# Patient Record
Sex: Female | Born: 1935 | Race: White | Hispanic: No | Smoking: Never smoker
Health system: Southern US, Community
[De-identification: ages and names within clinical notes are randomized; demographics above are authoritative.]

## PROBLEM LIST (undated history)

## (undated) DIAGNOSIS — G459 Transient cerebral ischemic attack, unspecified: Secondary | ICD-10-CM

## (undated) DIAGNOSIS — C801 Malignant (primary) neoplasm, unspecified: Secondary | ICD-10-CM

## (undated) DIAGNOSIS — E785 Hyperlipidemia, unspecified: Secondary | ICD-10-CM

## (undated) DIAGNOSIS — M1990 Unspecified osteoarthritis, unspecified site: Secondary | ICD-10-CM

## (undated) DIAGNOSIS — H409 Unspecified glaucoma: Secondary | ICD-10-CM

## (undated) DIAGNOSIS — G510 Bell's palsy: Secondary | ICD-10-CM

## (undated) DIAGNOSIS — I4891 Unspecified atrial fibrillation: Secondary | ICD-10-CM

## (undated) DIAGNOSIS — I10 Essential (primary) hypertension: Secondary | ICD-10-CM

## (undated) DIAGNOSIS — I509 Heart failure, unspecified: Secondary | ICD-10-CM

## (undated) DIAGNOSIS — K219 Gastro-esophageal reflux disease without esophagitis: Secondary | ICD-10-CM

## (undated) DIAGNOSIS — M316 Other giant cell arteritis: Secondary | ICD-10-CM

## (undated) HISTORY — PX: EYE SURGERY: SHX253

## (undated) HISTORY — PX: ABDOMINAL HYSTERECTOMY: SHX81

## (undated) HISTORY — PX: TONSILLECTOMY: SUR1361

## (undated) HISTORY — PX: APPENDECTOMY: SHX54

## (undated) HISTORY — PX: CHOLECYSTECTOMY: SHX55

## (undated) HISTORY — PX: CAROTID ENDARTERECTOMY: SUR193

## (undated) HISTORY — PX: BREAST SURGERY: SHX581

## (undated) HISTORY — PX: JOINT REPLACEMENT: SHX530

## (undated) HISTORY — PX: CARDIAC CATHETERIZATION: SHX172

---

## 2004-01-16 ENCOUNTER — Other Ambulatory Visit: Payer: Self-pay

## 2004-01-23 ENCOUNTER — Inpatient Hospital Stay: Payer: Self-pay | Admitting: Unknown Physician Specialty

## 2004-08-14 ENCOUNTER — Ambulatory Visit: Payer: Self-pay | Admitting: General Surgery

## 2005-08-20 ENCOUNTER — Ambulatory Visit: Payer: Self-pay | Admitting: General Surgery

## 2006-01-03 ENCOUNTER — Other Ambulatory Visit: Payer: Self-pay

## 2006-01-04 ENCOUNTER — Inpatient Hospital Stay: Payer: Self-pay | Admitting: Internal Medicine

## 2006-08-26 ENCOUNTER — Ambulatory Visit: Payer: Self-pay | Admitting: General Surgery

## 2007-08-27 ENCOUNTER — Ambulatory Visit: Payer: Self-pay | Admitting: General Surgery

## 2008-06-13 ENCOUNTER — Inpatient Hospital Stay: Payer: Self-pay | Admitting: Internal Medicine

## 2008-09-06 ENCOUNTER — Ambulatory Visit: Payer: Self-pay | Admitting: General Surgery

## 2008-11-20 ENCOUNTER — Emergency Department: Payer: Self-pay | Admitting: Emergency Medicine

## 2009-08-03 ENCOUNTER — Ambulatory Visit: Payer: Self-pay | Admitting: Family Medicine

## 2009-11-03 ENCOUNTER — Ambulatory Visit: Payer: Self-pay | Admitting: General Surgery

## 2010-07-22 ENCOUNTER — Inpatient Hospital Stay: Payer: Self-pay | Admitting: Internal Medicine

## 2012-03-30 ENCOUNTER — Emergency Department: Payer: Self-pay | Admitting: Emergency Medicine

## 2012-03-30 LAB — COMPREHENSIVE METABOLIC PANEL
Albumin: 3.9 g/dL (ref 3.4–5.0)
Alkaline Phosphatase: 121 U/L (ref 50–136)
Anion Gap: 7 (ref 7–16)
BUN: 26 mg/dL — ABNORMAL HIGH (ref 7–18)
Co2: 26 mmol/L (ref 21–32)
Creatinine: 0.96 mg/dL (ref 0.60–1.30)
EGFR (African American): 59 — ABNORMAL LOW
EGFR (Non-African Amer.): 51 — ABNORMAL LOW
Glucose: 106 mg/dL — ABNORMAL HIGH (ref 65–99)
Osmolality: 277 (ref 275–301)
Potassium: 4.1 mmol/L (ref 3.5–5.1)
Sodium: 136 mmol/L (ref 136–145)

## 2012-03-30 LAB — CBC
HCT: 37.8 % (ref 35.0–47.0)
MCHC: 32.4 g/dL (ref 32.0–36.0)
Platelet: 190 10*3/uL (ref 150–440)
RBC: 3.97 10*6/uL (ref 3.80–5.20)
RDW: 13.7 % (ref 11.5–14.5)
WBC: 10.1 10*3/uL (ref 3.6–11.0)

## 2012-03-30 LAB — PROTIME-INR: INR: 2.5

## 2012-04-21 ENCOUNTER — Ambulatory Visit: Payer: Self-pay | Admitting: Otolaryngology

## 2014-05-08 ENCOUNTER — Emergency Department: Payer: Self-pay | Admitting: Emergency Medicine

## 2014-05-08 LAB — CBC
HCT: 38.5 % (ref 35.0–47.0)
HGB: 12.6 g/dL (ref 12.0–16.0)
MCH: 29.8 pg (ref 26.0–34.0)
MCHC: 32.6 g/dL (ref 32.0–36.0)
MCV: 91 fL (ref 80–100)
Platelet: 188 10*3/uL (ref 150–440)
RBC: 4.21 10*6/uL (ref 3.80–5.20)
RDW: 14.3 % (ref 11.5–14.5)
WBC: 11.5 10*3/uL — AB (ref 3.6–11.0)

## 2014-05-08 LAB — COMPREHENSIVE METABOLIC PANEL
ALBUMIN: 3.4 g/dL (ref 3.4–5.0)
ANION GAP: 10 (ref 7–16)
Alkaline Phosphatase: 121 U/L — ABNORMAL HIGH
BUN: 26 mg/dL — AB (ref 7–18)
Bilirubin,Total: 0.4 mg/dL (ref 0.2–1.0)
Calcium, Total: 9 mg/dL (ref 8.5–10.1)
Chloride: 102 mmol/L (ref 98–107)
Co2: 25 mmol/L (ref 21–32)
Creatinine: 1.06 mg/dL (ref 0.60–1.30)
EGFR (African American): >60
EGFR (Non-African Amer.): 51 — ABNORMAL LOW
GLUCOSE: 119 mg/dL — AB (ref 65–99)
Osmolality: 280 (ref 275–301)
POTASSIUM: 3.7 mmol/L (ref 3.5–5.1)
SGOT(AST): 28 U/L (ref 15–37)
SGPT (ALT): 19 U/L
SODIUM: 137 mmol/L (ref 136–145)
Total Protein: 7.1 g/dL (ref 6.4–8.2)

## 2014-05-08 LAB — CK TOTAL AND CKMB (NOT AT ARMC)
CK, Total: 62 U/L (ref 26–192)
CK-MB: 1.8 ng/mL (ref 0.5–3.6)

## 2014-05-08 LAB — TROPONIN I

## 2014-05-08 LAB — PRO B NATRIURETIC PEPTIDE: B-Type Natriuretic Peptide: 2774 pg/mL — ABNORMAL HIGH (ref 0–450)

## 2014-11-23 ENCOUNTER — Encounter
Admission: RE | Admit: 2014-11-23 | Discharge: 2014-11-23 | Disposition: A | Discharge status: Home / Self Care | Payer: Medicare Other | Source: Ambulatory Visit | Attending: Cardiology | Admitting: Cardiology

## 2014-11-23 ENCOUNTER — Ambulatory Visit: Admission: RE | Admit: 2014-11-23 | Payer: Medicare Other | Source: Ambulatory Visit

## 2014-11-23 ENCOUNTER — Ambulatory Visit
Admission: RE | Admit: 2014-11-23 | Discharge: 2014-11-23 | Disposition: A | Discharge status: Home / Self Care | Payer: Medicare Other | Source: Ambulatory Visit | Attending: Cardiology | Admitting: Cardiology

## 2014-11-23 DIAGNOSIS — I10 Essential (primary) hypertension: Secondary | ICD-10-CM

## 2014-11-23 DIAGNOSIS — Z01812 Encounter for preprocedural laboratory examination: Secondary | ICD-10-CM | POA: Diagnosis not present

## 2014-11-23 DIAGNOSIS — Z01810 Encounter for preprocedural cardiovascular examination: Secondary | ICD-10-CM | POA: Diagnosis present

## 2014-11-23 HISTORY — DX: Heart failure, unspecified: I50.9

## 2014-11-23 HISTORY — DX: Unspecified osteoarthritis, unspecified site: M19.90

## 2014-11-23 HISTORY — DX: Hyperlipidemia, unspecified: E78.5

## 2014-11-23 HISTORY — DX: Unspecified atrial fibrillation: I48.91

## 2014-11-23 HISTORY — DX: Transient cerebral ischemic attack, unspecified: G45.9

## 2014-11-23 HISTORY — DX: Other giant cell arteritis: M31.6

## 2014-11-23 HISTORY — DX: Malignant (primary) neoplasm, unspecified: C80.1

## 2014-11-23 HISTORY — DX: Bell's palsy: G51.0

## 2014-11-23 HISTORY — DX: Unspecified glaucoma: H40.9

## 2014-11-23 HISTORY — DX: Essential (primary) hypertension: I10

## 2014-11-23 HISTORY — DX: Gastro-esophageal reflux disease without esophagitis: K21.9

## 2014-11-23 LAB — BASIC METABOLIC PANEL
ANION GAP: 10 (ref 5–15)
BUN: 30 mg/dL — ABNORMAL HIGH (ref 6–20)
CO2: 27 mmol/L (ref 22–32)
Calcium: 9.2 mg/dL (ref 8.9–10.3)
Chloride: 94 mmol/L — ABNORMAL LOW (ref 101–111)
Creatinine, Ser: 0.84 mg/dL (ref 0.44–1.00)
GFR calc non Af Amer: 57 mL/min — ABNORMAL LOW (ref >60)
GLUCOSE: 93 mg/dL (ref 65–99)
POTASSIUM: 4.4 mmol/L (ref 3.5–5.1)
Sodium: 131 mmol/L — ABNORMAL LOW (ref 135–145)

## 2014-11-23 LAB — CBC
HCT: 35.8 % (ref 35.0–47.0)
Hemoglobin: 11.6 g/dL — ABNORMAL LOW (ref 12.0–16.0)
MCH: 28.3 pg (ref 26.0–34.0)
MCHC: 32.3 g/dL (ref 32.0–36.0)
MCV: 87.7 fL (ref 80.0–100.0)
Platelets: 189 10*3/uL (ref 150–440)
RBC: 4.08 MIL/uL (ref 3.80–5.20)
RDW: 15.6 % — ABNORMAL HIGH (ref 11.5–14.5)
WBC: 7 10*3/uL (ref 3.6–11.0)

## 2014-11-23 LAB — PROTIME-INR
INR: 1.87
Prothrombin Time: 21.7 seconds — ABNORMAL HIGH (ref 11.4–15.0)

## 2014-11-23 LAB — DIFFERENTIAL
BASOS ABS: 0 10*3/uL (ref 0–0.1)
BASOS PCT: 1 %
Eosinophils Absolute: 0.2 10*3/uL (ref 0–0.7)
Eosinophils Relative: 3 %
LYMPHS PCT: 21 %
Lymphs Abs: 1.5 10*3/uL (ref 1.0–3.6)
MONO ABS: 0.5 10*3/uL (ref 0.2–0.9)
Monocytes Relative: 8 %
NEUTROS PCT: 67 %
Neutro Abs: 4.8 10*3/uL (ref 1.4–6.5)

## 2014-11-23 LAB — APTT: aPTT: 38 seconds — ABNORMAL HIGH (ref 24–36)

## 2014-11-23 NOTE — Patient Instructions (Addendum)
  Your procedure is scheduled on: November 29, 2014 (Tuesday) Report to Day Surgery. To find out your arrival time please call [phone] between 1PM - 3PM on November 28, 2014 (Monday).  Remember: Instructions that are not followed completely may result in serious medical risk, up to and including death, or upon the discretion of your surgeon and anesthesiologist your surgery may need to be rescheduled.    _x___ 1. Do not eat food or drink liquids after midnight. No gum chewing or hard candies.     ____ 2. No Alcohol for 24 hours before or after surgery.   ____ 3. Bring all medications with you on the day of surgery if instructed.    _x__ 4. Notify your doctor if there is any change in your medical condition     (cold, fever, infections).     Do not wear jewelry, make-up, hairpins, clips or nail polish.  Do not wear lotions, powders, or perfumes. You may wear deodorant.  Do not shave 48 hours prior to surgery. Men may shave face and neck.  Do not bring valuables to the hospital.    [facility] is not responsible for any belongings or valuables.               Contacts, dentures or bridgework may not be worn into surgery.  Leave your suitcase in the car. After surgery it may be brought to your room.  For patients admitted to the hospital, discharge time is determined by your                treatment team.   Patients discharged the day of surgery will not be allowed to drive home.   Please read over the following fact sheets that you were given:   Surgical Site Infection Prevention   ____ Take these medicines the morning of surgery with A SIP OF WATER:    1. TAKE ALL MEDICATIONS AS INSTRUCTED BY DR PARASCHOS OFFICE  2.   3.   4.  5.  6.  ____ Fleet Enema (as directed)   _x___ Use CHG Soap as directed  ____ Use inhalers on the day of surgery  ____ Stop metformin 2 days prior to surgery    ____ Take 1/2 of usual insulin dose the night before surgery and none on the morning of  surgery.   __x__ Stop Coumadin/Plavix/aspirin on (CALL DR PARASCHOS OFFICE AND ASK ABOUT ASPIRIN) STOP COUMADIN FIVE DAYS PRIOR TO SURGERY PER OFFICE __x__ Stop Anti-inflammatories on (STOP IBUPROFEN 7-10 DAYS PRIOR TO SURGERY)  __x__ Stop supplements until after surgery. (STOP VITAMIN B-12 NOW)   ____ Bring C-Pap to the hospital.

## 2014-11-24 NOTE — Pre-Procedure Instructions (Signed)
Dr. Saralyn Pilar office notified twice regarding Gentamicin order

## 2014-11-24 NOTE — Pre-Procedure Instructions (Signed)
EKG reviewed by Leafy Kindle ( anesthesia nurse).

## 2014-11-28 NOTE — Pre-Procedure Instructions (Signed)
Dr. Saralyn Pilar office notified three times regarding Gentamicin irrigation and has not had a response from office

## 2014-11-29 ENCOUNTER — Ambulatory Visit: Payer: Medicare Other | Admitting: Anesthesiology

## 2014-11-29 ENCOUNTER — Observation Stay: Payer: Medicare Other

## 2014-11-29 ENCOUNTER — Ambulatory Visit: Payer: Medicare Other

## 2014-11-29 ENCOUNTER — Encounter
Admission: RE | Disposition: A | Discharge status: Home / Self Care | Payer: Self-pay | Source: Ambulatory Visit | Attending: Cardiology

## 2014-11-29 ENCOUNTER — Ambulatory Visit
Admission: RE | Admit: 2014-11-29 | Discharge: 2014-11-30 | Disposition: A | Discharge status: Home / Self Care | Payer: Medicare Other | Source: Ambulatory Visit | Attending: Cardiology | Admitting: Cardiology

## 2014-11-29 DIAGNOSIS — Z950 Presence of cardiac pacemaker: Secondary | ICD-10-CM

## 2014-11-29 DIAGNOSIS — Z951 Presence of aortocoronary bypass graft: Secondary | ICD-10-CM | POA: Diagnosis not present

## 2014-11-29 DIAGNOSIS — I2510 Atherosclerotic heart disease of native coronary artery without angina pectoris: Secondary | ICD-10-CM | POA: Diagnosis not present

## 2014-11-29 DIAGNOSIS — I10 Essential (primary) hypertension: Secondary | ICD-10-CM | POA: Diagnosis not present

## 2014-11-29 DIAGNOSIS — I509 Heart failure, unspecified: Secondary | ICD-10-CM | POA: Insufficient documentation

## 2014-11-29 DIAGNOSIS — I252 Old myocardial infarction: Secondary | ICD-10-CM | POA: Insufficient documentation

## 2014-11-29 DIAGNOSIS — I495 Sick sinus syndrome: Principal | ICD-10-CM | POA: Insufficient documentation

## 2014-11-29 DIAGNOSIS — I4891 Unspecified atrial fibrillation: Secondary | ICD-10-CM | POA: Insufficient documentation

## 2014-11-29 DIAGNOSIS — M1990 Unspecified osteoarthritis, unspecified site: Secondary | ICD-10-CM | POA: Insufficient documentation

## 2014-11-29 DIAGNOSIS — Z8673 Personal history of transient ischemic attack (TIA), and cerebral infarction without residual deficits: Secondary | ICD-10-CM | POA: Insufficient documentation

## 2014-11-29 DIAGNOSIS — H409 Unspecified glaucoma: Secondary | ICD-10-CM | POA: Diagnosis not present

## 2014-11-29 DIAGNOSIS — Z859 Personal history of malignant neoplasm, unspecified: Secondary | ICD-10-CM | POA: Diagnosis not present

## 2014-11-29 DIAGNOSIS — E785 Hyperlipidemia, unspecified: Secondary | ICD-10-CM | POA: Diagnosis not present

## 2014-11-29 DIAGNOSIS — K219 Gastro-esophageal reflux disease without esophagitis: Secondary | ICD-10-CM | POA: Insufficient documentation

## 2014-11-29 HISTORY — PX: PACEMAKER INSERTION: SHX728

## 2014-11-29 LAB — PROTIME-INR
INR: 1.12
PROTHROMBIN TIME: 14.6 s (ref 11.4–15.0)

## 2014-11-29 SURGERY — INSERTION, CARDIAC PACEMAKER
Anesthesia: Monitor Anesthesia Care | Site: Chest | Laterality: Left | Wound class: Clean

## 2014-11-29 MED ORDER — FENTANYL CITRATE (PF) 100 MCG/2ML IJ SOLN: 25.0000 ug | INTRAMUSCULAR | Status: DC | PRN

## 2014-11-29 MED ORDER — VANCOMYCIN HCL IN DEXTROSE 1-5 GM/200ML-% IV SOLN
1000.0000 mg | Freq: Two times a day (BID) | INTRAVENOUS | Status: AC
  Administered 2014-11-30: 1000 mg via INTRAVENOUS
  Filled 2014-11-29: qty 200

## 2014-11-29 MED ORDER — METOPROLOL TARTRATE 25 MG PO TABS
25.0000 mg | ORAL_TABLET | Freq: Two times a day (BID) | ORAL | Status: DC
  Administered 2014-11-29 – 2014-11-30 (×3): 25 mg via ORAL
  Filled 2014-11-29 (×3): qty 1

## 2014-11-29 MED ORDER — SODIUM CHLORIDE 0.9 % IR SOLN
Freq: Once | Status: AC
  Administered 2014-11-29: 10:00:00
  Filled 2014-11-29: qty 2

## 2014-11-29 MED ORDER — GENTAMICIN SULFATE 40 MG/ML IJ SOLN
INTRAMUSCULAR | Status: DC | PRN
  Administered 2014-11-29: 250 mL

## 2014-11-29 MED ORDER — HYDROCHLOROTHIAZIDE 25 MG PO TABS
25.0000 mg | ORAL_TABLET | Freq: Every day | ORAL | Status: DC
  Administered 2014-11-29 – 2014-11-30 (×2): 25 mg via ORAL
  Filled 2014-11-29 (×2): qty 1

## 2014-11-29 MED ORDER — OXYCODONE HCL 5 MG/5ML PO SOLN: 5.0000 mg | Freq: Once | ORAL | Status: DC | PRN

## 2014-11-29 MED ORDER — VANCOMYCIN HCL IN DEXTROSE 1-5 GM/200ML-% IV SOLN
1000.0000 mg | Freq: Once | INTRAVENOUS | Status: AC
  Administered 2014-11-29: 1000 mg via INTRAVENOUS

## 2014-11-29 MED ORDER — OXYBUTYNIN CHLORIDE 5 MG PO TABS
5.0000 mg | ORAL_TABLET | Freq: Three times a day (TID) | ORAL | Status: DC
  Administered 2014-11-29 – 2014-11-30 (×3): 5 mg via ORAL
  Filled 2014-11-29 (×4): qty 1

## 2014-11-29 MED ORDER — GENTAMICIN SULFATE 40 MG/ML IJ SOLN
INTRAMUSCULAR | Status: AC
  Filled 2014-11-29: qty 2

## 2014-11-29 MED ORDER — ONDANSETRON HCL 4 MG/2ML IJ SOLN: 4.0000 mg | Freq: Four times a day (QID) | INTRAMUSCULAR | Status: DC | PRN

## 2014-11-29 MED ORDER — LIDOCAINE 1 % OPTIME INJ - NO CHARGE
INTRAMUSCULAR | Status: DC | PRN
  Administered 2014-11-29: 30 mL

## 2014-11-29 MED ORDER — FENTANYL CITRATE (PF) 100 MCG/2ML IJ SOLN
INTRAMUSCULAR | Status: DC | PRN
  Administered 2014-11-29 (×2): 25 ug via INTRAVENOUS

## 2014-11-29 MED ORDER — OXYCODONE HCL 5 MG PO TABS: 5.0000 mg | ORAL_TABLET | Freq: Once | ORAL | Status: DC | PRN

## 2014-11-29 MED ORDER — LACTATED RINGERS IV SOLN
INTRAVENOUS | Status: DC
  Administered 2014-11-29: 11:00:00 via INTRAVENOUS

## 2014-11-29 MED ORDER — ACETAMINOPHEN 325 MG PO TABS
325.0000 mg | ORAL_TABLET | ORAL | Status: DC | PRN
  Administered 2014-11-30: 650 mg via ORAL
  Filled 2014-11-29: qty 2

## 2014-11-29 MED ORDER — VANCOMYCIN HCL IN DEXTROSE 1-5 GM/200ML-% IV SOLN
INTRAVENOUS | Status: AC
  Administered 2014-11-29: 1000 mg via INTRAVENOUS
  Filled 2014-11-29: qty 200

## 2014-11-29 MED ORDER — METOPROLOL TARTRATE 1 MG/ML IV SOLN
INTRAVENOUS | Status: DC | PRN
  Administered 2014-11-29: 2.5 mg via INTRAVENOUS

## 2014-11-29 MED ORDER — SODIUM CHLORIDE 0.9 % IJ SOLN
INTRAMUSCULAR | Status: AC
  Filled 2014-11-29: qty 50

## 2014-11-29 MED ORDER — MIDAZOLAM HCL 2 MG/2ML IJ SOLN
INTRAMUSCULAR | Status: DC | PRN
  Administered 2014-11-29: 0.5 mg via INTRAVENOUS

## 2014-11-29 MED ORDER — BENAZEPRIL HCL 20 MG PO TABS
20.0000 mg | ORAL_TABLET | Freq: Every day | ORAL | Status: DC
  Administered 2014-11-29 – 2014-11-30 (×2): 20 mg via ORAL
  Filled 2014-11-29 (×2): qty 1

## 2014-11-29 MED ORDER — SODIUM CHLORIDE 0.9 % IJ SOLN
INTRAMUSCULAR | Status: AC
  Administered 2014-11-29: 3 mL
  Filled 2014-11-29: qty 3

## 2014-11-29 SURGICAL SUPPLY — 32 items
BAG DECANTER STRL (MISCELLANEOUS) ×3 IMPLANT
BRUSH SCRUB 4% CHG (MISCELLANEOUS) ×3 IMPLANT
CABLE SURG 12 DISP A/V CHANNEL (MISCELLANEOUS) ×3 IMPLANT
CANISTER SUCT 1200ML W/VALVE (MISCELLANEOUS) ×3 IMPLANT
CHLORAPREP W/TINT 26ML (MISCELLANEOUS) ×3 IMPLANT
COVER LIGHT HANDLE STERIS (MISCELLANEOUS) ×6 IMPLANT
COVER MAYO STAND STRL (DRAPES) IMPLANT
DRAPE C-ARM XRAY 36X54 (DRAPES) ×3 IMPLANT
DRESSING TELFA 4X3 1S ST N-ADH (GAUZE/BANDAGES/DRESSINGS) ×3 IMPLANT
DRSG TEGADERM 4X4.75 (GAUZE/BANDAGES/DRESSINGS) ×3 IMPLANT
GLOVE BIO SURGEON STRL SZ7.5 (GLOVE) ×6 IMPLANT
GLOVE BIO SURGEON STRL SZ8 (GLOVE) ×6 IMPLANT
GOWN STRL REUS W/ TWL LRG LVL3 (GOWN DISPOSABLE) ×1 IMPLANT
GOWN STRL REUS W/ TWL XL LVL3 (GOWN DISPOSABLE) ×1 IMPLANT
GOWN STRL REUS W/TWL LRG LVL3 (GOWN DISPOSABLE) ×2
GOWN STRL REUS W/TWL XL LVL3 (GOWN DISPOSABLE) ×2
IMMOBILIZER SHDR MD LX WHT (SOFTGOODS) ×3 IMPLANT
IMMOBILIZER SHDR XL LX WHT (SOFTGOODS) IMPLANT
INTRO PACEMKR SHEATH II 7FR (MISCELLANEOUS) ×3
INTRODUCER PACEMKR SHTH II 7FR (MISCELLANEOUS) ×1 IMPLANT
IV NS 500ML (IV SOLUTION) ×2
IV NS 500ML BAXH (IV SOLUTION) ×1 IMPLANT
KIT RM TURNOVER STRD PROC AR (KITS) ×3 IMPLANT
LABEL OR SOLS (LABEL) ×3 IMPLANT
LEAD CAPSURE NOVUS 5076-52CM (Lead) ×3 IMPLANT
MARKER SKIN W/RULER 31145785 (MISCELLANEOUS) ×3 IMPLANT
PACK PACE INSERTION (MISCELLANEOUS) ×3 IMPLANT
PAD GROUND ADULT SPLIT (MISCELLANEOUS) ×3 IMPLANT
PAD STATPAD (MISCELLANEOUS) ×3 IMPLANT
PPM'ADVISA SR MRI A3SR01 (Pacemaker) ×1 IMPLANT
PPMADVISA SR MRI A3SR01 (Pacemaker) ×2 IMPLANT
SUT SILK 0 SH 30 (SUTURE) ×6 IMPLANT

## 2014-11-29 NOTE — Care Management Note (Signed)
Case Management Note  Patient Details  Name: Joyce Vasquez MRN: [id number] Date of Birth: November 04, 1916  Subjective/Objective:    81yo Mrs Shondrika Hoque was admitted 11/29/14 with Atrial Fib and received a pacemaker the same day. Resides at [place]. Daughter Marylu Lund ph: [phone] plans to stay with Mrs Arbaugh at [facility] for a week or more after this hospital discharge. Mrs Escorcia has a rolling walker with a seat, a cane, and a shower chair at home. No home health needs anticipated. Daughter Jocelyn Lamer provides transportation and any other care needs.                 Action/Plan:   Expected Discharge Date:                  Expected Discharge Plan:     In-House Referral:     Discharge planning Services     Post Acute Care Choice:    Choice offered to:     DME Arranged:    DME Agency:     HH Arranged:    [facility]:     Status of Service:     Medicare Important Message Given:    Date Medicare IM Given:    Medicare IM give by:    Date Additional Medicare IM Given:    Additional Medicare Important Message give by:     If discussed at [place] of Stay Meetings, dates discussed:    Additional Comments:  Earsie Humm A, RN 11/29/2014, 5:03 PM

## 2014-11-29 NOTE — H&P (Signed)
Progress Notes   Joyce Vasquez (MR# [id number])        Progress Notes Info     Author Note Status Last Update User Last Update Date/Time Service Date   Flossie Dibble, MD Signed Flossie Dibble, MD Wed Nov 16, 2014 11:54 AM Thu Nov 03, 2014 11:21 AM    Progress Notes    Expand All Collapse All   Established Patient Visit   Chief Complaint: Chief Complaint  Patient presents with  . Follow-up    echo today   Date of Service: 11/03/2014 Date of Birth: Dec 16, 1916 PCP: Joyce Carrow [name], MD  History of Present Illness: Joyce Vasquez is a 81 y.o.female patient  Sick sinus syndrome The patient has had chronic non valvular atrial fibrillation for years without pacemaker placement with symptoms including dyspnea, irregular heart beat and dizziness worsening with increased severity and frequency on medications including no meds. The patient has causes and risk factors of atrial fibrillation including hypertension, valve disease and structural heart disease and or dysfunction. The identified symptoms associated with atrial fibrillation have  altered the patient's quality of life. The patient has been on anticoagulation for further risk reduction of cardiovascular event and/or stroke Holter The holter monitor shows frequent PVCs, atrial fibrillation, sick sinus syndrome, heart block and symptomatic bradycardia  Essential hypertension The patient has been on medication management listed below for essential hypertension. Reported average blood pressure readings recently have shown that the blood pressure is stable. The patient has not had any side effects of these medications at this time. We have discussed treatment goals and the patient for which they understand and agree with medication and lifestyle management. Currently there is no apparent secondary causes of the hypertension  Results for orders placed or performed in visit on 11/03/14  Echocardiogram 2D complete   Result Value Ref Range   LV Ejection Fraction (%) 50    Aortic Valve Stenosis Grade none    Aortic Valve Regurgitation Grade mild    Aortic Valve Stenosis Mean Gradient (mmHg) 4.0 mmHg   Aortic Valve Max Velocity (m/s) 1.4 m/sec   Mitral Valve Stenosis Grade none    Mitral Valve Regurgitation Grade moderate    Tricuspid Valve Regurgitation Grade moderate    Tricuspid Valve Regurgitation Max Velocity (m/s) 3.2 m/sec   Right Ventricle Systolic Pressure (mmHg) 97.3 mmHg   LV End Diastolic Diameter (cm) 4.1 cm   LV End Systolic Diameter (cm) 3.0 cm   LV Posterior Wall Thickness (cm) 1.2 cm   Left Atrium Diameter (cm) 4.8 cm   Narrative    CARDIOLOGY DEPARTMENT Joyce Vasquez [facility] [id number]  [facility] Acct #: [id number]  [address] [place], [place] [address] Date: 11/03/2014 10:17 AM  Adult Female Age: 81 yrs  ECHOCARDIOGRAM REPORT Outpatient  STUDY:CHEST WALL TAPE: KC::KCWC  ECHO:Yes DOPPLER:Yes FILE: MD1:  COLOR:Yes CONTRAST:Yes MACHINE:Philips  RV BIOPSY:No 3D:No SOUND QLTY:Moderate  MEDIUM:None ___________________________________________________________________________________________   HISTORY:Chest pain  REASON:Assess, LV function  INDICATION:I48.2 (ICD-10-CM) - Chronic atrial fibrillation ___________________________________________________________________________________________  ECHOCARDIOGRAPHIC MEASUREMENTS 2D DIMENSIONS AORTA Values Normal Range MAIN PA Values  Normal Range  Annulus: 1.6 cm [2.1 - 2.5] PA Main: nm* [1.5 - 2.1]  Aorta Sin: nm* [2.7 - 3.3] RIGHT VENTRICLE  ST Junction: nm* [2.3 - 2.9] RV Base: nm* [ < 4.2]  Asc.Aorta: nm* [2.3 - 3.1] RV Mid: nm* [ < 3.5] LEFT VENTRICLE RV Length: nm* [ < 8.6]  LVIDd: 4.1 cm [3.9 - 5.3] INFERIOR VENA  CAVA  LVIDs: 3.0 cm Max. IVC: nm* [ <= 2.1]  FS: 25.4 % [> 25] Min. IVC: nm*  SWT: nm* [0.5 - 0.9] ------------------  PWT: 1.2 cm [0.5 - 0.9] nm* - not measured LEFT ATRIUM  LA Diam: 4.8 cm [2.7 - 3.8]  LA A4C Area: nm* [ < 20]  LA Volume: nm* [22 - 52] ___________________________________________________________________________________________  ECHOCARDIOGRAPHIC DESCRIPTIONS  AORTIC ROOT  Size:Normal  Dissection:INDETERM FOR DISSECTION  AORTIC VALVE  Leaflets:Tricuspid Morphology:MODERATELY THICKENED  Mobility:Fully mobile  LEFT VENTRICLE  Size:Normal Anterior:Normal Contraction:Normal Lateral:Normal  Closest EF:50% (Estimated) Septal:Normal  LV Masses:No Masses Apical:Normal  ELF:YBOF LVH Inferior:Normal  Posterior:Normal Dias.FxClass:N/A  MITRAL VALVE  Leaflets:Normal Mobility:Fully mobile  Morphology:ANNULAR CALC  LEFT ATRIUM  Size:MODERATELY ENLARGED LA Masses:No masses  MAIN PA  Size:Normal  PULMONIC VALVE  Leaflets:N/A Morphology:Normal  Mobility:Fully mobile  RIGHT  VENTRICLE  RV Masses:No Masses Size:Normal  Free Wall:Normal Contraction:Normal  TRICUSPID VALVE  Leaflets:Normal Mobility:Fully mobile  Morphology:Normal  RIGHT ATRIUM  Size:MODERATELY ENLARGED RA Other:None  RA Mass:No masses  PERICARDIUM  Fluid:No effusion  INFERIOR VENACAVA  Size:Normal Normal respiratory collapse  _____________________________________________________________________  DOPPLER ECHO and OTHER SPECIAL PROCEDURES  Aortic:MILD AR No AS  140.0 cm/sec peak vel 7.8 mmHg peak grad  4.0 mmHg mean grad   Mitral:MODERATE MR No MS  MV Inflow E Vel=157.0 cm/sec MV Annulus E'Vel=6.0 cm/sec  E/E'Ratio=26.2  Tricuspid:MODERATE TR No TS  324.0 cm/sec peak TR vel 52.0 mmHg peak RV pressure  Pulmonary:MILD PR No PS  93.6 cm/sec peak vel 3.5 mmHg peak grad    ___________________________________________________________________________________________ INTERPRETATION NORMAL LEFT VENTRICULAR SYSTOLIC FUNCTION WITH MILD LVH MODERATE VALVULAR REGURGITATION (See above) NO VALVULAR STENOSIS MODERATE PHTN   ___________________________________________________________________________________________ Electronically signed by: MD Serafina Royals on 11/03/2014 11:10 AM  Performed By: Maurilio Lovely, RDCS  Ordering Physician: Serafina Royals  ___________________________________________________________________________________________       Past Medical and Surgical History  Past Medical History Past Medical History  Diagnosis Date  . Hypertension   . Arthritis     Osteo  . Hyperlipidemia   . Temporal arteritis 1999    Blind in L Eye  . Blind left  eye 1999    Temporal Arteritis  . Vitamin B12 deficiency     On oral Rx  . Atrial fibrillation     Declined Rx  . GERD (gastroesophageal reflux disease)     Controlled Ranitidine  . Cranial nerve palsy   . Obesity   . Low bone mass   . Valvular heart disease   . PVD (peripheral vascular disease)   . Allergic state   . CHF (congestive heart failure)   . Glaucoma (increased eye pressure)   . DJD (degenerative joint disease)   . TIA (transient ischemic attack)   . Bell's palsy     Past Surgical History She has past surgical history that includes Carotid endarterectomy (1992); Appendectomy (1953); Hysterectomy (1953); Breast excisional biopsy (Left, 1994); vein stripping (Right, 1980); Cholecystectomy (1997); cardiac cath (03/1997); Joint replacement (Right, 1993); and Joint replacement (Left, 2005).   Medications and Allergies  Current Medications   Current Medications    Current Outpatient Prescriptions  Medication Sig Dispense Refill  . benazepril-hydrochlorthiazide (LOTENSIN HCT) 20-25 mg tablet Take 1 tablet by mouth once daily. 90 tablet prn  . bimatoprost (LUMIGAN) 0.03 % ophthalmic solution 1 drop nightly.    Marland Kitchen COUMADIN 2 mg tablet Take 1 tablet (2 mg total) by mouth once daily. 30 tablet 3  . COUMADIN 3 mg  tablet Take 1 tablet (3 mg total) by mouth once daily. 90 tablet 3  . CYANOCOBALAMIN, VITAMIN B-12, (VITAMIN B-12 ORAL) Take by mouth.    . cyclobenzaprine (FLEXERIL) 5 MG tablet 1-2 tablets po tid prn pain 30 tablet 0  . FUROsemide (LASIX) 20 MG tablet Take 1 tablet (20 mg total) by mouth once daily as needed for Edema. 30 tablet 11  . nitroGLYcerin (NITROSTAT) 0.4 MG SL tablet Place 0.4 mg under the tongue every 5 (five) minutes as needed for Chest pain (1 tab under tongue every 5 min. If 3rd tab is needed call 911]). May take up to 3 doses.    Marland Kitchen oxybutynin  (DITROPAN-XL) 5 MG XL tablet Take 1 tablet (5 mg total) by mouth once daily. 90 tablet 3  . potassium chloride (KLOR-CON) 10 MEQ ER tablet Take one whenever taking one furosemide as needed 30 tablet 11  . ranitidine (ZANTAC) 150 MG capsule Take 1 capsule (150 mg total) by mouth 2 (two) times daily. 180 capsule 3   No current facility-administered medications for this visit.      Allergies: Erythromycin; Morphine; Penicillins; and Vitamin d  Social and Family History  Social History  reports that she has never smoked. She has never used smokeless tobacco. She reports that she does not drink alcohol or use illicit drugs.  Family History Family History  Problem Relation Age of Onset  . Colon cancer Mother 32  . Heart attack Maternal Aunt   . Tremor Maternal Aunt     hand  . Stroke Maternal Grandmother   . Heart attack Maternal Grandfather     Review of Systems   Review of Systems  Positive for dizziness Negative for weight gain weight loss, weakness, vision change, hearing loss, cough, congestion, PND, orthopnea, heartburn, nausea, diaphoresis, vomiting, diarrhea, bloody stool, melena, stomach pain, extremity pain, leg weakness, leg cramping, leg blood clots, headache, blackouts, nosebleed, trouble swallowing, mouth pain, urinary frequency, urination at night, muscle weakness, skin lesions, skin rashes, tingling , numbness, anxiety, and/or depression Physical Examination   Vitals:BP 140/80 mmHg  Pulse 58  Resp 15  Ht 162.6 cm (5\' 4" )  Wt 78.926 kg (174 lb)  BMI 29.85 kg/m2 Ht:162.6 cm (5\' 4" ) Wt:78.926 kg (174 lb) WJX:BJYN surface area is 1.89 meters squared. Body mass index is 29.85 kg/(m^2). Appearance: well appearing in no acute distress HEENT: Pupils equally reactive to light and accomodation, no apparent xanthalasma  Neck: Supple, no apparent thyromegaly, masses, or lymphadenopathy  Lungs: normal respiratory effort;  no crackles, no rhonchi, no wheezes Heart: irregular rate and rhythm. Normal S1 S2 No gallops, murmur, no rub, PMI is normal size and placement. carotid upstroke normal with bruit. Jugular venous pressure is normal Abdomen: soft, nontender, not distended with normal bowel sounds. No apparent hepatosplenomegally. Abdominal aorta is normal size without bruit Extremities: 1+ edema, no ulcers, no clubbing, no cyanosis Peripheral Pulses: 2+ in upper extremities, 1+ femoral pulses bilaterally, 1+lower extremity  Musculoskeletal; Normal muscle tone with kyphosis Neurological: Cranial nerves intact   Assessment   81 y.o. female with  Encounter Diagnoses  Name Primary?  . Chronic atrial fibrillation Yes  . Moderate tricuspid insufficiency   . Benign essential hypertension         Plan  -The patient is to have consultation and permanent pacemaker placement for sick sinus syndrome and symptomatic bradycardia. The patient understands all risks and benefits of permanent pacemaker placement. This includes the possibility of death, stroke, heart attack, hemopericardium, pneumothorax,  infection, bleeding, blood clot, and reaction to medications. The patient is at low risk for general anesthesia     No orders of the defined types were placed in this encounter.   Return in about 4 weeks (around 12/01/2014).  Flossie Dibble, [facility]   [address] [place] [address]    Service Location    Name Address       [address] [place] [address]      Department    Name Address Phone Fax   [facility] [place] [address] [phone]       Progress Notes   Joyce Vasquez (MR# [id number])        Progress Notes Info     Author Note Status Last Update User Last Update Date/Time Service Date   Flossie Dibble, MD Signed Flossie Dibble, MD Wed Nov 16, 2014 11:54 AM Thu Nov 03, 2014 11:21 AM    Progress Notes    Expand All Collapse All   Established Patient Visit   Chief Complaint: Chief Complaint  Patient presents with  . Follow-up    echo today   Date of Service: 11/03/2014 Date of Birth: 03-19-1917 PCP: Joyce Carrow [name], MD  History of Present Illness: Ms. Fornes is a 81 y.o.female patient  Sick sinus syndrome The patient has had chronic non valvular atrial fibrillation for years without pacemaker placement with symptoms including dyspnea, irregular heart beat and dizziness worsening with increased severity and frequency on medications including no meds. The patient has causes and risk factors of atrial fibrillation including hypertension, valve disease and structural heart disease and or dysfunction. The identified symptoms associated with atrial fibrillation have  altered the patient's quality of life. The patient has been on anticoagulation for further risk reduction of cardiovascular event and/or stroke Holter The holter monitor shows frequent PVCs, atrial fibrillation, sick sinus syndrome, heart block and symptomatic bradycardia  Essential hypertension The patient has been on medication management listed below for essential hypertension. Reported average blood pressure readings recently have shown that the blood pressure is stable. The patient has not had any side effects of these medications at this time. We have discussed treatment goals and the patient for which they understand and agree with medication and lifestyle management. Currently there is no apparent secondary causes of the hypertension  Results for orders placed or performed in visit on 11/03/14  Echocardiogram 2D complete  Result Value Ref Range   LV Ejection Fraction (%) 50    Aortic Valve Stenosis Grade none    Aortic Valve Regurgitation Grade mild    Aortic Valve Stenosis Mean Gradient (mmHg) 4.0  mmHg   Aortic Valve Max Velocity (m/s) 1.4 m/sec   Mitral Valve Stenosis Grade none    Mitral Valve Regurgitation Grade moderate    Tricuspid Valve Regurgitation Grade moderate    Tricuspid Valve Regurgitation Max Velocity (m/s) 3.2 m/sec   Right Ventricle Systolic Pressure (mmHg) 42.7 mmHg   LV End Diastolic Diameter (cm) 4.1 cm   LV End Systolic Diameter (cm) 3.0 cm   LV Posterior Wall Thickness (cm) 1.2 cm   Left Atrium Diameter (cm) 4.8 cm   Narrative    CARDIOLOGY DEPARTMENT [facility] [id number]  [facility] Acct #: [id number]  [address] [place], [place] [address] Date: 11/03/2014  10:17 AM  Adult Female Age: 41 yrs  ECHOCARDIOGRAM REPORT Outpatient  STUDY:CHEST WALL TAPE: KC::KCWC  ECHO:Yes DOPPLER:Yes FILE: MD1:  COLOR:Yes CONTRAST:Yes MACHINE:Philips  RV BIOPSY:No 3D:No SOUND QLTY:Moderate  MEDIUM:None ___________________________________________________________________________________________   HISTORY:Chest pain  REASON:Assess, LV function  INDICATION:I48.2 (ICD-10-CM) - Chronic atrial fibrillation ___________________________________________________________________________________________  ECHOCARDIOGRAPHIC MEASUREMENTS 2D DIMENSIONS AORTA Values Normal Range MAIN PA Values Normal Range  Annulus: 1.6 cm [2.1 - 2.5] PA Main: nm* [1.5 - 2.1]  Aorta Sin: nm* [2.7 - 3.3] RIGHT VENTRICLE  ST Junction: nm* [2.3 -  2.9] RV Base: nm* [ < 4.2]  Asc.Aorta: nm* [2.3 - 3.1] RV Mid: nm* [ < 3.5] LEFT VENTRICLE RV Length: nm* [ < 8.6]  LVIDd: 4.1 cm [3.9 - 5.3] INFERIOR VENA CAVA  LVIDs: 3.0 cm Max. IVC: nm* [ <= 2.1]  FS: 25.4 % [> 25] Min. IVC: nm*  SWT: nm* [0.5 - 0.9] ------------------  PWT: 1.2 cm [0.5 - 0.9] nm* - not measured LEFT ATRIUM  LA Diam: 4.8 cm [2.7 - 3.8]  LA A4C Area: nm* [ < 20]  LA Volume: nm* [22 - 52] ___________________________________________________________________________________________  ECHOCARDIOGRAPHIC DESCRIPTIONS  AORTIC ROOT  Size:Normal  Dissection:INDETERM FOR DISSECTION  AORTIC VALVE  Leaflets:Tricuspid Morphology:MODERATELY THICKENED  Mobility:Fully mobile  LEFT VENTRICLE  Size:Normal Anterior:Normal Contraction:Normal Lateral:Normal  Closest EF:50% (Estimated) Septal:Normal  LV Masses:No Masses Apical:Normal  NAT:FTDD LVH Inferior:Normal  Posterior:Normal Dias.FxClass:N/A  MITRAL VALVE  Leaflets:Normal Mobility:Fully mobile  Morphology:ANNULAR CALC  LEFT ATRIUM  Size:MODERATELY ENLARGED LA Masses:No masses  MAIN PA  Size:Normal  PULMONIC VALVE  Leaflets:N/A Morphology:Normal  Mobility:Fully mobile  RIGHT VENTRICLE  RV Masses:No Masses Size:Normal  Free Wall:Normal Contraction:Normal  TRICUSPID VALVE  Leaflets:Normal Mobility:Fully mobile   Morphology:Normal  RIGHT ATRIUM  Size:MODERATELY ENLARGED RA Other:None  RA Mass:No masses  PERICARDIUM  Fluid:No effusion  INFERIOR VENACAVA  Size:Normal Normal respiratory collapse  _____________________________________________________________________  DOPPLER ECHO and OTHER SPECIAL PROCEDURES  Aortic:MILD AR No AS  140.0 cm/sec peak vel 7.8 mmHg peak grad  4.0 mmHg mean grad   Mitral:MODERATE MR No MS  MV Inflow E Vel=157.0 cm/sec MV Annulus E'Vel=6.0 cm/sec  E/E'Ratio=26.2  Tricuspid:MODERATE TR No TS  324.0 cm/sec peak TR vel 52.0 mmHg peak RV pressure  Pulmonary:MILD PR No PS  93.6 cm/sec peak vel 3.5 mmHg peak grad    ___________________________________________________________________________________________ INTERPRETATION NORMAL LEFT VENTRICULAR SYSTOLIC FUNCTION WITH MILD LVH MODERATE VALVULAR REGURGITATION (See above) NO VALVULAR STENOSIS MODERATE PHTN   ___________________________________________________________________________________________ Electronically signed by: MD Serafina Royals on 11/03/2014 11:10 AM  Performed By: Maurilio Lovely, RDCS  Ordering Physician: Serafina Royals  ___________________________________________________________________________________________       Past Medical and Surgical History  Past Medical History Past Medical History  Diagnosis Date  . Hypertension   . Arthritis     Osteo  . Hyperlipidemia   . Temporal arteritis 1999    Blind in L Eye  . Blind left eye 1999    Temporal Arteritis  . Vitamin B12 deficiency     On oral Rx  . Atrial fibrillation     Declined Rx  . GERD (gastroesophageal reflux disease)     Controlled  Ranitidine  . Cranial nerve palsy   . Obesity   . Low bone mass   . Valvular heart disease   . PVD (peripheral vascular disease)   . Allergic state   . CHF (congestive heart failure)   . Glaucoma (increased eye pressure)   . DJD (degenerative joint disease)   . TIA (  transient ischemic attack)   . Bell's palsy     Past Surgical History She has past surgical history that includes Carotid endarterectomy (1992); Appendectomy (1953); Hysterectomy (1953); Breast excisional biopsy (Left, 1994); vein stripping (Right, 1980); Cholecystectomy (1997); cardiac cath (03/1997); Joint replacement (Right, 1993); and Joint replacement (Left, 2005).   Medications and Allergies  Current Medications   Current Medications    Current Outpatient Prescriptions  Medication Sig Dispense Refill  . benazepril-hydrochlorthiazide (LOTENSIN HCT) 20-25 mg tablet Take 1 tablet by mouth once daily. 90 tablet prn  . bimatoprost (LUMIGAN) 0.03 % ophthalmic solution 1 drop nightly.    Marland Kitchen COUMADIN 2 mg tablet Take 1 tablet (2 mg total) by mouth once daily. 30 tablet 3  . COUMADIN 3 mg tablet Take 1 tablet (3 mg total) by mouth once daily. 90 tablet 3  . CYANOCOBALAMIN, VITAMIN B-12, (VITAMIN B-12 ORAL) Take by mouth.    . cyclobenzaprine (FLEXERIL) 5 MG tablet 1-2 tablets po tid prn pain 30 tablet 0  . FUROsemide (LASIX) 20 MG tablet Take 1 tablet (20 mg total) by mouth once daily as needed for Edema. 30 tablet 11  . nitroGLYcerin (NITROSTAT) 0.4 MG SL tablet Place 0.4 mg under the tongue every 5 (five) minutes as needed for Chest pain (1 tab under tongue every 5 min. If 3rd tab is needed call 911]). May take up to 3 doses.    Marland Kitchen oxybutynin (DITROPAN-XL) 5 MG XL tablet Take 1 tablet (5 mg total) by mouth once daily. 90 tablet 3  . potassium chloride (KLOR-CON) 10 MEQ ER tablet Take one whenever taking one furosemide as needed 30  tablet 11  . ranitidine (ZANTAC) 150 MG capsule Take 1 capsule (150 mg total) by mouth 2 (two) times daily. 180 capsule 3   No current facility-administered medications for this visit.      Allergies: Erythromycin; Morphine; Penicillins; and Vitamin d  Social and Family History  Social History  reports that she has never smoked. She has never used smokeless tobacco. She reports that she does not drink alcohol or use illicit drugs.  Family History Family History  Problem Relation Age of Onset  . Colon cancer Mother 6  . Heart attack Maternal Aunt   . Tremor Maternal Aunt     hand  . Stroke Maternal Grandmother   . Heart attack Maternal Grandfather     Review of Systems   Review of Systems  Positive for dizziness Negative for weight gain weight loss, weakness, vision change, hearing loss, cough, congestion, PND, orthopnea, heartburn, nausea, diaphoresis, vomiting, diarrhea, bloody stool, melena, stomach pain, extremity pain, leg weakness, leg cramping, leg blood clots, headache, blackouts, nosebleed, trouble swallowing, mouth pain, urinary frequency, urination at night, muscle weakness, skin lesions, skin rashes, tingling , numbness, anxiety, and/or depression Physical Examination   Vitals:BP 140/80 mmHg  Pulse 58  Resp 15  Ht 162.6 cm (5\' 4" )  Wt 78.926 kg (174 lb)  BMI 29.85 kg/m2 Ht:162.6 cm (5\' 4" ) Wt:78.926 kg (174 lb) WUJ:WJXB surface area is 1.89 meters squared. Body mass index is 29.85 kg/(m^2). Appearance: well appearing in no acute distress HEENT: Pupils equally reactive to light and accomodation, no apparent xanthalasma  Neck: Supple, no apparent thyromegaly, masses, or lymphadenopathy  Lungs: normal respiratory effort; no crackles, no rhonchi, no wheezes Heart: irregular rate and rhythm. Normal S1 S2 No gallops, murmur, no rub, PMI is normal size and placement. carotid upstroke normal with bruit. Jugular venous  pressure is normal Abdomen: soft,  nontender, not distended with normal bowel sounds. No apparent hepatosplenomegally. Abdominal aorta is normal size without bruit Extremities: 1+ edema, no ulcers, no clubbing, no cyanosis Peripheral Pulses: 2+ in upper extremities, 1+ femoral pulses bilaterally, 1+lower extremity  Musculoskeletal; Normal muscle tone with kyphosis Neurological: Cranial nerves intact   Assessment   81 y.o. female with  Encounter Diagnoses  Name Primary?  . Chronic atrial fibrillation Yes  . Moderate tricuspid insufficiency   . Benign essential hypertension         Plan  -The patient is to have consultation and permanent pacemaker placement for sick sinus syndrome and symptomatic bradycardia. The patient understands all risks and benefits of permanent pacemaker placement. This includes the possibility of death, stroke, heart attack, hemopericardium, pneumothorax, infection, bleeding, blood clot, and reaction to medications. The patient is at low risk for general anesthesia     No orders of the defined types were placed in this encounter.   Return in about 4 weeks (around 12/01/2014).  Flossie Dibble, [facility]   [address] [place] [address]    Service Location    Name Address       [place] [address]      Department    Name Address Phone Fax   [facility] [place] [address] [phone]

## 2014-11-29 NOTE — Op Note (Signed)
[facility] Cardiology   11/29/2014                     1:16 PM  PATIENT:  Joyce Vasquez    PRE-OPERATIVE DIAGNOSIS:  SSS  POST-OPERATIVE DIAGNOSIS:  Same  PROCEDURE:  INSERTION PACEMAKER  SURGEON:  Arriyah Madej, MD    ANESTHESIA:     PREOPERATIVE INDICATIONS:  Joyce Vasquez is a  81 y.o. female with a diagnosis of SSS who failed conservative measures and elected for surgical management.    The risks benefits and alternatives were discussed with the patient preoperatively including but not limited to the risks of infection, bleeding, cardiopulmonary complications, the need for revision surgery, among others, and the patient was willing to proceed.   OPERATIVE PROCEDURE: The patient was brought to the operating room the fasting state. Left pectoral region was prepped and draped in the usual standard manner. Anesthesia was obtained with 1% lidocaine. A 6 cm incision was performed over the left pectoral region. The pacemaker pocket was generated by electrocautery and blunt dissection. Access was obtained to left subclavian vein by fine needle aspiration. MRI compatible lead was positioned to right ventricular apical septum. After proper thresholds were obtained the lead was sutured in place. Lead was connected to a MRI compatible single-chamber pacemaker generator ( Advise SR MRI ). Pacemaker pocket was irrigated with gentamicin solution. Generator was positioned a pocket pocket was closed with 2-0 and 4-0 Vicryls, respectively. Steri-Strips and a pressure dressing were applied.

## 2014-11-29 NOTE — Anesthesia Preprocedure Evaluation (Signed)
Anesthesia Evaluation  Patient identified by MRN, date of birth, ID band Patient awake    Reviewed: Allergy & Precautions, H&P , NPO status , Patient's Chart, lab work & pertinent test results  Airway Mallampati: III  TM Distance: >3 FB Neck ROM: limited    Dental  (+) Poor Dentition, Chipped, Missing, Edentulous Lower   Pulmonary neg pulmonary ROS,  breath sounds clear to auscultation  Pulmonary exam normal       Cardiovascular Exercise Tolerance: Good hypertension, + Past MI and +CHF - CAD and - CABG Normal cardiovascular examRhythm:regular Rate:Normal     Neuro/Psych TIAnegative psych ROS   GI/Hepatic Neg liver ROS, GERD-  Controlled,  Endo/Other  negative endocrine ROS  Renal/GU negative Renal ROS  negative genitourinary   Musculoskeletal  (+) Arthritis -,   Abdominal   Peds  Hematology negative hematology ROS (+)   Anesthesia Other Findings Past Medical History:   Hypertension                                                 Arthritis                                                      Comment:osteoarthritis   Hyperlipidemia                                               Temporal giant cell arteritis                                  Comment:left eye   Atrial fibrillation                                          GERD (gastroesophageal reflux disease)                       CHF (congestive heart failure)                               Glaucoma                                                     DJD (degenerative joint disease)                             TIA (transient ischemic attack)                              Bell's palsy  Cancer                                                         Comment:skin   Reproductive/Obstetrics negative OB ROS                             Anesthesia Physical Anesthesia Plan  ASA: IV  Anesthesia Plan:  MAC   Post-op Pain Management:    Induction:   Airway Management Planned:   Additional Equipment:   Intra-op Plan:   Post-operative Plan:   Informed Consent: I have reviewed the patients History and Physical, chart, labs and discussed the procedure including the risks, benefits and alternatives for the proposed anesthesia with the patient or authorized representative who has indicated his/her understanding and acceptance.   Dental Advisory Given  Plan Discussed with: Anesthesiologist, CRNA and Surgeon  Anesthesia Plan Comments:         Anesthesia Quick Evaluation

## 2014-11-29 NOTE — Anesthesia Postprocedure Evaluation (Signed)
  Anesthesia Post-op Note  Patient: Joyce Vasquez  Procedure(s) Performed: Procedure(s): INSERTION PACEMAKER (Left)  Anesthesia type:MAC  Patient location: PACU  Post pain: Pain level controlled  Post assessment: Post-op Vital signs reviewed, Patient's Cardiovascular Status Stable, Respiratory Function Stable, Patent Airway and No signs of Nausea or vomiting  Post vital signs: Reviewed and stable  Last Vitals:  Filed Vitals:   11/29/14 1414  BP: 158/82  Pulse: 105  Temp: 36.3 C  Resp: 20    Level of consciousness: awake, alert  and patient cooperative  Complications: No apparent anesthesia complications

## 2014-11-29 NOTE — Transfer of Care (Signed)
Immediate Anesthesia Transfer of Care Note  Patient: Joyce Vasquez  Procedure(s) Performed: Procedure(s): INSERTION PACEMAKER (Left)  Patient Location: PACU  Anesthesia Type:MAC  Level of Consciousness: awake and patient cooperative  Airway & Oxygen Therapy: Patient Spontanous Breathing  Post-op Assessment: Report given to RN  Post vital signs: Reviewed and stable  Last Vitals:  Filed Vitals:   11/29/14 1315  BP: 159/78  Pulse: 84  Temp: 98.0  Resp: 18    Complications: No apparent anesthesia complications

## 2014-11-29 NOTE — Interval H&P Note (Signed)
History and Physical Interval Note:  11/29/2014 9:49 AM  Joyce Vasquez  has presented today for surgery, with the diagnosis of SSS  The various methods of treatment have been discussed with the patient and family. After consideration of risks, benefits and other options for treatment, the patient has consented to  Procedure(s): INSERTION PACEMAKER (N/A) as a surgical intervention .  The patient's history has been reviewed, patient examined, no change in status, stable for surgery.  I have reviewed the patient's chart and labs.  Questions were answered to the patient's satisfaction.     Ashanna Heinsohn

## 2014-11-30 ENCOUNTER — Encounter: Payer: Self-pay | Admitting: Cardiology

## 2014-11-30 DIAGNOSIS — I495 Sick sinus syndrome: Secondary | ICD-10-CM | POA: Diagnosis not present

## 2014-11-30 MED ORDER — VANCOMYCIN HCL 125 MG PO CAPS: 125.0000 mg | ORAL_CAPSULE | Freq: Four times a day (QID) | ORAL | Status: DC

## 2014-11-30 NOTE — Progress Notes (Signed)
Patient d/c'd to independent living. Education provided, no questions at this time. Patient to be picked up by daughter. Telemetry removed. Wilnette Kales

## 2014-11-30 NOTE — Discharge Instructions (Signed)
Do not lift left arm above head

## 2014-12-04 ENCOUNTER — Encounter: Payer: Self-pay | Admitting: Emergency Medicine

## 2014-12-04 ENCOUNTER — Observation Stay
Admission: EM | Admit: 2014-12-04 | Discharge: 2014-12-05 | Disposition: A | Discharge status: Home / Self Care | Payer: Medicare Other | Attending: Internal Medicine | Admitting: Internal Medicine

## 2014-12-04 ENCOUNTER — Emergency Department: Payer: Medicare Other

## 2014-12-04 DIAGNOSIS — R252 Cramp and spasm: Secondary | ICD-10-CM | POA: Diagnosis not present

## 2014-12-04 DIAGNOSIS — Z79899 Other long term (current) drug therapy: Secondary | ICD-10-CM | POA: Insufficient documentation

## 2014-12-04 DIAGNOSIS — H409 Unspecified glaucoma: Secondary | ICD-10-CM | POA: Insufficient documentation

## 2014-12-04 DIAGNOSIS — Z90710 Acquired absence of both cervix and uterus: Secondary | ICD-10-CM | POA: Insufficient documentation

## 2014-12-04 DIAGNOSIS — I10 Essential (primary) hypertension: Secondary | ICD-10-CM | POA: Diagnosis not present

## 2014-12-04 DIAGNOSIS — R42 Dizziness and giddiness: Secondary | ICD-10-CM | POA: Diagnosis present

## 2014-12-04 DIAGNOSIS — Z7982 Long term (current) use of aspirin: Secondary | ICD-10-CM | POA: Insufficient documentation

## 2014-12-04 DIAGNOSIS — K219 Gastro-esophageal reflux disease without esophagitis: Secondary | ICD-10-CM | POA: Insufficient documentation

## 2014-12-04 DIAGNOSIS — M79605 Pain in left leg: Secondary | ICD-10-CM | POA: Diagnosis not present

## 2014-12-04 DIAGNOSIS — Z7901 Long term (current) use of anticoagulants: Secondary | ICD-10-CM | POA: Insufficient documentation

## 2014-12-04 DIAGNOSIS — Z881 Allergy status to other antibiotic agents status: Secondary | ICD-10-CM | POA: Insufficient documentation

## 2014-12-04 DIAGNOSIS — I495 Sick sinus syndrome: Secondary | ICD-10-CM | POA: Insufficient documentation

## 2014-12-04 DIAGNOSIS — Z880 Allergy status to penicillin: Secondary | ICD-10-CM | POA: Insufficient documentation

## 2014-12-04 DIAGNOSIS — Z8673 Personal history of transient ischemic attack (TIA), and cerebral infarction without residual deficits: Secondary | ICD-10-CM | POA: Diagnosis not present

## 2014-12-04 DIAGNOSIS — E785 Hyperlipidemia, unspecified: Secondary | ICD-10-CM | POA: Diagnosis not present

## 2014-12-04 DIAGNOSIS — I482 Chronic atrial fibrillation: Secondary | ICD-10-CM | POA: Diagnosis not present

## 2014-12-04 DIAGNOSIS — R51 Headache: Secondary | ICD-10-CM | POA: Diagnosis not present

## 2014-12-04 DIAGNOSIS — I509 Heart failure, unspecified: Secondary | ICD-10-CM | POA: Diagnosis not present

## 2014-12-04 DIAGNOSIS — Z96653 Presence of artificial knee joint, bilateral: Secondary | ICD-10-CM | POA: Diagnosis not present

## 2014-12-04 DIAGNOSIS — I639 Cerebral infarction, unspecified: Secondary | ICD-10-CM | POA: Diagnosis present

## 2014-12-04 DIAGNOSIS — I739 Peripheral vascular disease, unspecified: Secondary | ICD-10-CM | POA: Insufficient documentation

## 2014-12-04 DIAGNOSIS — M1990 Unspecified osteoarthritis, unspecified site: Secondary | ICD-10-CM | POA: Insufficient documentation

## 2014-12-04 DIAGNOSIS — R531 Weakness: Principal | ICD-10-CM

## 2014-12-04 DIAGNOSIS — Z885 Allergy status to narcotic agent status: Secondary | ICD-10-CM | POA: Insufficient documentation

## 2014-12-04 DIAGNOSIS — Z950 Presence of cardiac pacemaker: Secondary | ICD-10-CM | POA: Insufficient documentation

## 2014-12-04 DIAGNOSIS — M6289 Other specified disorders of muscle: Secondary | ICD-10-CM | POA: Diagnosis present

## 2014-12-04 DIAGNOSIS — Z9841 Cataract extraction status, right eye: Secondary | ICD-10-CM | POA: Diagnosis not present

## 2014-12-04 DIAGNOSIS — Z85828 Personal history of other malignant neoplasm of skin: Secondary | ICD-10-CM | POA: Insufficient documentation

## 2014-12-04 LAB — COMPREHENSIVE METABOLIC PANEL
ALBUMIN: 3.8 g/dL (ref 3.5–5.0)
ALT: 16 U/L (ref 14–54)
ANION GAP: 9 (ref 5–15)
AST: 25 U/L (ref 15–41)
Alkaline Phosphatase: 91 U/L (ref 38–126)
BILIRUBIN TOTAL: 0.4 mg/dL (ref 0.3–1.2)
BUN: 26 mg/dL — ABNORMAL HIGH (ref 6–20)
CO2: 26 mmol/L (ref 22–32)
CREATININE: 0.89 mg/dL (ref 0.44–1.00)
Calcium: 9.7 mg/dL (ref 8.9–10.3)
Chloride: 99 mmol/L — ABNORMAL LOW (ref 101–111)
GFR calc Af Amer: >60 mL/min (ref >60)
GFR calc non Af Amer: 53 mL/min — ABNORMAL LOW (ref >60)
GLUCOSE: 106 mg/dL — AB (ref 65–99)
POTASSIUM: 4.1 mmol/L (ref 3.5–5.1)
SODIUM: 134 mmol/L — AB (ref 135–145)
TOTAL PROTEIN: 6.8 g/dL (ref 6.5–8.1)

## 2014-12-04 LAB — PROTIME-INR
INR: 1.15
PROTHROMBIN TIME: 14.9 s (ref 11.4–15.0)

## 2014-12-04 LAB — DIFFERENTIAL
BASOS ABS: 0 10*3/uL (ref 0–0.1)
Basophils Relative: 1 %
EOS ABS: 0.2 10*3/uL (ref 0–0.7)
Eosinophils Relative: 3 %
LYMPHS ABS: 1.8 10*3/uL (ref 1.0–3.6)
LYMPHS PCT: 27 %
MONOS PCT: 8 %
Monocytes Absolute: 0.5 10*3/uL (ref 0.2–0.9)
Neutro Abs: 4.3 10*3/uL (ref 1.4–6.5)
Neutrophils Relative %: 63 %

## 2014-12-04 LAB — CBC
HEMATOCRIT: 34.5 % — AB (ref 35.0–47.0)
HEMOGLOBIN: 11.2 g/dL — AB (ref 12.0–16.0)
MCH: 28.7 pg (ref 26.0–34.0)
MCHC: 32.6 g/dL (ref 32.0–36.0)
MCV: 87.9 fL (ref 80.0–100.0)
PLATELETS: 144 10*3/uL — AB (ref 150–440)
RBC: 3.92 MIL/uL (ref 3.80–5.20)
RDW: 15.4 % — ABNORMAL HIGH (ref 11.5–14.5)
WBC: 6.9 10*3/uL (ref 3.6–11.0)

## 2014-12-04 LAB — HEPARIN LEVEL (UNFRACTIONATED): HEPARIN UNFRACTIONATED: 0.71 [IU]/mL — AB (ref 0.30–0.70)

## 2014-12-04 LAB — APTT: APTT: 31 s (ref 24–36)

## 2014-12-04 LAB — GLUCOSE, CAPILLARY: GLUCOSE-CAPILLARY: 110 mg/dL — AB (ref 65–99)

## 2014-12-04 LAB — TROPONIN I

## 2014-12-04 MED ORDER — VANCOMYCIN HCL 125 MG PO CAPS: 125.0000 mg | ORAL_CAPSULE | Freq: Four times a day (QID) | ORAL | Status: DC

## 2014-12-04 MED ORDER — HYDRALAZINE HCL 20 MG/ML IJ SOLN
10.0000 mg | Freq: Four times a day (QID) | INTRAMUSCULAR | Status: DC | PRN
  Administered 2014-12-04 (×2): 10 mg via INTRAVENOUS
  Filled 2014-12-04 (×2): qty 1

## 2014-12-04 MED ORDER — VITAMIN B-12 100 MCG PO TABS
100.0000 ug | ORAL_TABLET | ORAL | Status: DC
  Administered 2014-12-05: 100 ug via ORAL
  Filled 2014-12-04: qty 1

## 2014-12-04 MED ORDER — ONDANSETRON HCL 4 MG PO TABS
4.0000 mg | ORAL_TABLET | Freq: Four times a day (QID) | ORAL | Status: DC | PRN
Start: 2014-12-04 — End: 2014-12-05

## 2014-12-04 MED ORDER — NITROGLYCERIN 0.4 MG SL SUBL: 0.4000 mg | SUBLINGUAL_TABLET | SUBLINGUAL | Status: DC | PRN

## 2014-12-04 MED ORDER — CYCLOBENZAPRINE HCL 10 MG PO TABS
5.0000 mg | ORAL_TABLET | Freq: Three times a day (TID) | ORAL | Status: DC | PRN
  Administered 2014-12-04: 5 mg via ORAL
  Filled 2014-12-04: qty 1

## 2014-12-04 MED ORDER — OXYBUTYNIN CHLORIDE ER 5 MG PO TB24
5.0000 mg | ORAL_TABLET | Freq: Every day | ORAL | Status: DC
  Administered 2014-12-04: 5 mg via ORAL
  Filled 2014-12-04 (×2): qty 1

## 2014-12-04 MED ORDER — HYDROCHLOROTHIAZIDE 25 MG PO TABS
25.0000 mg | ORAL_TABLET | Freq: Every day | ORAL | Status: DC
  Administered 2014-12-04 – 2014-12-05 (×2): 25 mg via ORAL
  Filled 2014-12-04 (×2): qty 1

## 2014-12-04 MED ORDER — FUROSEMIDE 20 MG PO TABS: 20.0000 mg | ORAL_TABLET | ORAL | Status: DC | PRN

## 2014-12-04 MED ORDER — ALUM & MAG HYDROXIDE-SIMETH 200-200-20 MG/5ML PO SUSP: 30.0000 mL | Freq: Four times a day (QID) | ORAL | Status: DC | PRN

## 2014-12-04 MED ORDER — PANTOPRAZOLE SODIUM 40 MG PO TBEC
40.0000 mg | DELAYED_RELEASE_TABLET | Freq: Every day | ORAL | Status: DC
  Administered 2014-12-04: 40 mg via ORAL
  Filled 2014-12-04 (×2): qty 1

## 2014-12-04 MED ORDER — HEPARIN BOLUS VIA INFUSION
4000.0000 [IU] | Freq: Once | INTRAVENOUS | Status: AC
  Administered 2014-12-04: 4000 [IU] via INTRAVENOUS
  Filled 2014-12-04: qty 4000

## 2014-12-04 MED ORDER — ACETAMINOPHEN 650 MG RE SUPP
650.0000 mg | Freq: Four times a day (QID) | RECTAL | Status: DC | PRN
Start: 2014-12-04 — End: 2014-12-05

## 2014-12-04 MED ORDER — ONDANSETRON HCL 4 MG/2ML IJ SOLN: 4.0000 mg | Freq: Four times a day (QID) | INTRAMUSCULAR | Status: DC | PRN

## 2014-12-04 MED ORDER — ACETAMINOPHEN 325 MG PO TABS
650.0000 mg | ORAL_TABLET | Freq: Four times a day (QID) | ORAL | Status: DC | PRN
  Filled 2014-12-04: qty 2

## 2014-12-04 MED ORDER — LABETALOL HCL 5 MG/ML IV SOLN
10.0000 mg | Freq: Once | INTRAVENOUS | Status: AC
  Administered 2014-12-04: 10 mg via INTRAVENOUS
  Filled 2014-12-04: qty 4

## 2014-12-04 MED ORDER — HEPARIN (PORCINE) IN NACL 100-0.45 UNIT/ML-% IJ SOLN
950.0000 [IU]/h | INTRAMUSCULAR | Status: DC
  Administered 2014-12-04: 1050 [IU]/h via INTRAVENOUS
  Administered 2014-12-05: 950 [IU]/h via INTRAVENOUS
  Filled 2014-12-04 (×4): qty 250

## 2014-12-04 MED ORDER — TRAZODONE HCL 50 MG PO TABS: 25.0000 mg | ORAL_TABLET | Freq: Every evening | ORAL | Status: DC | PRN

## 2014-12-04 MED ORDER — DOCUSATE SODIUM 100 MG PO CAPS
100.0000 mg | ORAL_CAPSULE | Freq: Two times a day (BID) | ORAL | Status: DC
  Administered 2014-12-04 – 2014-12-05 (×3): 100 mg via ORAL
  Filled 2014-12-04 (×3): qty 1

## 2014-12-04 MED ORDER — ACETAMINOPHEN 500 MG PO TABS
500.0000 mg | ORAL_TABLET | Freq: Every evening | ORAL | Status: DC | PRN
  Administered 2014-12-05: 500 mg via ORAL
  Filled 2014-12-04: qty 1

## 2014-12-04 MED ORDER — BENAZEPRIL-HYDROCHLOROTHIAZIDE 20-25 MG PO TABS: 1.0000 | ORAL_TABLET | Freq: Every day | ORAL | Status: DC

## 2014-12-04 MED ORDER — HYDRALAZINE HCL 25 MG PO TABS: 25.0000 mg | ORAL_TABLET | Freq: Three times a day (TID) | ORAL | Status: DC

## 2014-12-04 MED ORDER — DIPHENHYDRAMINE HCL 25 MG PO CAPS: 25.0000 mg | ORAL_CAPSULE | Freq: Every evening | ORAL | Status: DC | PRN

## 2014-12-04 MED ORDER — WARFARIN - PHARMACIST DOSING INPATIENT
Freq: Every day | Status: DC
Start: 2014-12-05 — End: 2014-12-05
  Administered 2014-12-05: 11:00:00

## 2014-12-04 MED ORDER — BENAZEPRIL HCL 20 MG PO TABS
20.0000 mg | ORAL_TABLET | Freq: Every day | ORAL | Status: DC
  Administered 2014-12-04 – 2014-12-05 (×2): 20 mg via ORAL
  Filled 2014-12-04 (×2): qty 1

## 2014-12-04 MED ORDER — DIPHENHYDRAMINE-APAP (SLEEP) 25-500 MG PO TABS: 1.0000 | ORAL_TABLET | Freq: Every evening | ORAL | Status: DC | PRN

## 2014-12-04 MED ORDER — BISACODYL 5 MG PO TBEC: 5.0000 mg | DELAYED_RELEASE_TABLET | Freq: Every day | ORAL | Status: DC | PRN

## 2014-12-04 MED ORDER — WARFARIN SODIUM 4 MG PO TABS
4.0000 mg | ORAL_TABLET | Freq: Every day | ORAL | Status: DC
  Administered 2014-12-05: 4 mg via ORAL
  Filled 2014-12-04: qty 1

## 2014-12-04 MED ORDER — WARFARIN SODIUM 4 MG PO TABS
4.0000 mg | ORAL_TABLET | Freq: Once | ORAL | Status: AC
  Administered 2014-12-04: 4 mg via ORAL
  Filled 2014-12-04: qty 2

## 2014-12-04 MED ORDER — ASPIRIN EC 81 MG PO TBEC
162.0000 mg | DELAYED_RELEASE_TABLET | Freq: Every day | ORAL | Status: DC
  Administered 2014-12-04 – 2014-12-05 (×2): 162 mg via ORAL
  Filled 2014-12-04: qty 2

## 2014-12-04 NOTE — Progress Notes (Signed)
ANTICOAGULATION CONSULT NOTE - Initial Consult  Pharmacy Consult for Warfarin  Indication: atrial fibrillation  Allergies  Allergen Reactions  . Erythromycin Base Nausea And Vomiting  . Penicillins Swelling    "swelling of throat"  . Morphine And Related Rash    Patient Measurements: Height: 5\' 5"  (165.1 cm) Weight: 172 lb (78.019 kg) IBW/kg (Calculated) : 57  Vital Signs: Temp: 97.8 F (36.6 C) (08/14 1151) Temp Source: Oral (08/14 1151) BP: 186/83 mmHg (08/14 1151) Pulse Rate: 70 (08/14 1151)  Labs:  Recent Labs  12/04/14 0330  HGB 11.2*  HCT 34.5*  PLT 144*  APTT 31  LABPROT 14.9  INR 1.15  CREATININE 0.89  TROPONINI <0.03    Estimated Creatinine Clearance: 39 mL/min (by C-Vasquez formula based on Cr of 0.89).   Medical History: Past Medical History  Diagnosis Date  . Hypertension   . Arthritis     osteoarthritis  . Hyperlipidemia   . Temporal giant cell arteritis     left eye  . Atrial fibrillation   . GERD (gastroesophageal reflux disease)   . CHF (congestive heart failure)   . Glaucoma   . DJD (degenerative joint disease)   . TIA (transient ischemic attack)   . Bell's palsy   . Cancer     skin    Medications:  Scheduled:  . aspirin EC  162 mg Oral Daily  . benazepril  20 mg Oral Daily   And  . hydrochlorothiazide  25 mg Oral Daily  . docusate sodium  100 mg Oral BID  . oxybutynin  5 mg Oral QHS  . pantoprazole  40 mg Oral Daily  . [START ON 12/05/2014] vitamin B-12  100 mcg Oral Once per day on Mon Wed Fri  . warfarin  4 mg Oral Once  . [START ON 12/05/2014] warfarin  4 mg Oral Daily  . [START ON 12/05/2014] Warfarin - Pharmacist Dosing Inpatient   Does not apply q1800    Assessment: Patient is a 81 yo female with chronic a fib.  Per notes, patient takes warfarin as outpatient.  PTA dosing of warfarin 2 mg on Mon, Wed and 3 mg on Tues, Thurs, Fri, Sat, Sun.  Patient currently ordered Heparin drip.  INR: 1.15, Hgb: 11.2  Patient restarted  warfarin after procedure on 8/11. Last dose per Med Rec on 8/13.  INR remains subtherapeutic.   Goal of Therapy:  INR 2-3  Plan:  Will order warfarin 4 mg po daily as a dose increase from outpatient as INR is subtherapeutic.  Will check INR in AM.  CBC to be checked in AM.  Joyce Vasquez 12/04/2014,1:09 PM

## 2014-12-04 NOTE — Progress Notes (Signed)
Patient alert and oriented x4. Oriented to room, unit, and call bell. Admission completed. No complaints at this time. Will cont to assess. Skin assessment verified by Georga Hacking, RN. Telemetry box verified. Wilnette Kales

## 2014-12-04 NOTE — ED Notes (Signed)
Pt resting in stretcher , no distress, family at bedside

## 2014-12-04 NOTE — ED Notes (Signed)
Pt. Here from [facility] via EMS for pain to lower left extremity.  Pt. States having pace maker placement last week.  Pt. States tightness to left leg.

## 2014-12-04 NOTE — ED Notes (Signed)
Pt c/o sharp pain to temple areas of head bilaterally.  States it was really bad but has eased off, currently 5/10.  Pt states she does not need pain medication at this time.  MD informed.

## 2014-12-04 NOTE — ED Notes (Signed)
Admitting MD at bedside.

## 2014-12-04 NOTE — ED Provider Notes (Signed)
[facility] Emergency Department Provider Note  ____________________________________________  Time seen: 3:50 AM  I have reviewed the triage vital signs and the nursing notes.   HISTORY  Chief Complaint Extremity Weakness     HPI Joyce Vasquez is a 81 y.o. female who reports that she lay down to get a bed at 11 PM this past evening but noted she had some "drawing" to her left leg. With this discomfort to the left leg, the family called 911. They were concerned about possible CVA because she recently stopped her Coumadin in order to have a pacemaker implanted for atrial fibrillation. The Coumadin was restarted this past Thursday.  The patient arrived by EMS. She is alert and pleasant. She reports the "drawing" has moved further up her body. On arrival we note facial asymmetry. She denies having a headache. She is not having any nausea.   Past Medical History  Diagnosis Date  . Hypertension   . Arthritis     osteoarthritis  . Hyperlipidemia   . Temporal giant cell arteritis     left eye  . Atrial fibrillation   . GERD (gastroesophageal reflux disease)   . CHF (congestive heart failure)   . Glaucoma   . DJD (degenerative joint disease)   . TIA (transient ischemic attack)   . Bell's palsy   . Cancer     skin    Patient Active Problem List   Diagnosis Date Noted  . Status post cardiac pacemaker procedure 11/29/2014    Past Surgical History  Procedure Laterality Date  . Carotid endarterectomy Right   . Appendectomy    . Abdominal hysterectomy    . Cholecystectomy    . Cardiac catheterization    . Joint replacement Bilateral     Right and Left knee Replacement  . Breast surgery Left     Excisional Breast Biopsy  . Tonsillectomy    . Eye surgery Right     Cataract Extraction--right eye only  . Pacemaker insertion Left 11/29/2014    Procedure: INSERTION PACEMAKER;  Surgeon: Isaias Cowman, MD;  Location: [facility] ORS;  Service: Cardiovascular;   Laterality: Left;    Current Outpatient Rx  Name  Route  Sig  Dispense  Refill  . aspirin EC 81 MG tablet   Oral   Take 162 mg by mouth daily.         . benazepril-hydrochlorthiazide (LOTENSIN HCT) 20-25 MG per tablet   Oral   Take 1 tablet by mouth daily.         . bimatoprost (LUMIGAN) 0.03 % ophthalmic solution   Right Eye   Place 1 drop into the right eye at bedtime.         . Cyanocobalamin (VITAMIN B-12 PO)   Oral   Take 2 tablets by mouth 3 (three) times a week.         . cyclobenzaprine (FLEXERIL) 5 MG tablet   Oral   Take 5 mg by mouth 3 (three) times daily as needed for muscle spasms.         . diphenhydramine-acetaminophen (TYLENOL PM) 25-500 MG TABS   Oral   Take 1 tablet by mouth at bedtime as needed.         . furosemide (LASIX) 20 MG tablet   Oral   Take 20 mg by mouth as needed.         Marland Kitchen ibuprofen (ADVIL,MOTRIN) 200 MG tablet   Oral   Take 200 mg by mouth every  6 (six) hours as needed.         . nitroGLYCERIN (NITROSTAT) 0.4 MG SL tablet   Sublingual   Place 0.4 mg under the tongue every 5 (five) minutes as needed for chest pain. After 3rd dose if no relief call 911         . omeprazole (PRILOSEC) 20 MG capsule   Oral   Take 20 mg by mouth daily.         Marland Kitchen oxybutynin (DITROPAN-XL) 5 MG 24 hr tablet   Oral   Take 5 mg by mouth at bedtime.         . potassium chloride (KLOR-CON 10) 10 MEQ tablet   Oral   Take 10 mEq by mouth daily. Take one whenever taking one furosemide as needed         . vancomycin (VANCOCIN HCL) 125 MG capsule   Oral   Take 1 capsule (125 mg total) by mouth 4 (four) times daily.   28 capsule   0   . warfarin (COUMADIN) 2 MG tablet   Oral   Take 2 mg by mouth 2 (two) times a week. Monday and Wednesday         . warfarin (COUMADIN) 3 MG tablet   Oral   Take 3 mg by mouth once. Tuesday, Thursday, Friday, Saturday and Sunday           Allergies Erythromycin base; Penicillins; and Morphine  and related  History reviewed. No pertinent family history.  Social History Social History  Substance Use Topics  . Smoking status: Never Smoker   . Smokeless tobacco: Never Used  . Alcohol Use: No    Review of Systems  Constitutional: Negative for fever. ENT: Negative for sore throat. Cardiovascular: History of atrial fibrillation with recent pacemaker implantation. No chest pain. Respiratory: Negative for shortness of breath. Gastrointestinal: Negative for abdominal pain, vomiting and diarrhea. Genitourinary: Negative for dysuria. Musculoskeletal: No myalgias or injuries. Skin: Negative for rash. Neurological: Worrisome for weakness on the left with "drawing". See history of present illness   10-point ROS otherwise negative.  ____________________________________________   PHYSICAL EXAM:  VITAL SIGNS: ED Triage Vitals  Enc Vitals Group     BP 12/04/14 0333 197/95 mmHg     Pulse Rate 12/04/14 0333 92     Resp 12/04/14 0333 18     Temp 12/04/14 0333 97.6 F (36.4 C)     Temp src --      SpO2 12/04/14 0333 95 %     Weight 12/04/14 0333 172 lb (78.019 kg)     Height 12/04/14 0333 5\' 5"  (1.651 m)     Head Cir --      Peak Flow --      Pain Score 12/04/14 0336 0     Pain Loc --      Pain Edu? --      Excl. in [place]? --     Constitutional: Alert and interactive. Well appearing and in no distress, but with noted facial asymmetry. ENT   Head: Normocephalic and atraumatic.   Nose: No congestion/rhinnorhea.   Mouth/Throat: Mucous membranes are moist. Cardiovascular: Normal rate,irregular rhythm, no murmur noted Respiratory:  Normal respiratory effort, no tachypnea.    Breath sounds are clear and equal bilaterally.  Gastrointestinal: Soft and nontender. No distention.  Back: No muscle spasm, no tenderness, no CVA tenderness. Musculoskeletal: No deformity noted. Nontender with normal range of motion in all extremities. Noted bilateral scars over the knees  consistent with bilateral total knee replacement. She has 1+ edema in both legs which is baseline according to her daughter.  Neurologic:  Normal speech, despite some facial asymmetry on the left. The patient has Artie had a swallowing study performed in the emergency department which she has failed. Other than the noted facial asymmetry and the failed swallowing study, the patient appears to be neurologically intact. She has equal grip strength bilaterally. She has 5 or 5 strength in all 4 extremities. She has intact sensation. Skin:  Skin is warm, dry. No rash noted. Psychiatric: Mood and affect are normal. Speech and behavior are normal.  ____________________________________________    LABS (pertinent positives/negatives)  Labs Reviewed  CBC - Abnormal; Notable for the following:    Hemoglobin 11.2 (*)    HCT 34.5 (*)    RDW 15.4 (*)    Platelets 144 (*)    All other components within normal limits  COMPREHENSIVE METABOLIC PANEL - Abnormal; Notable for the following:    Sodium 134 (*)    Chloride 99 (*)    Glucose, Bld 106 (*)    BUN 26 (*)    GFR calc non Af Amer 53 (*)    All other components within normal limits  GLUCOSE, CAPILLARY - Abnormal; Notable for the following:    Glucose-Capillary 110 (*)    All other components within normal limits  PROTIME-INR  APTT  DIFFERENTIAL  TROPONIN I  CBG MONITORING, ED     ____________________________________________   EKG  ED ECG REPORT I, Judithann Villamar W, the attending physician, personally viewed and interpreted this ECG.   Date: 12/04/2014  EKG Time: 3:30 AM  Rate: 92  Rhythm: Underlying atrial fibrillation with occasional demand pacemaker spikes  Axis: Normal  Intervals: Normal  ST&T Change: None noted   ____________________________________________    RADIOLOGY  CT scan head: IMPRESSION: 1. No acute intracranial pathology seen on CT. 2. Mild to moderate cortical volume loss and scattered small vessel ischemic  microangiopathy. ____________________________________________  CRITICAL CARE Performed by: Ahmed Prima   Total critical care time: 30 minutes due to the critical nature of this patient's acute CVA with focal weakness.  Critical care time was exclusive of separately billable procedures and treating other patients.  Critical care was necessary to treat or prevent imminent or life-threatening deterioration.  Critical care was time spent personally by me on the following activities: development of treatment plan with patient and/or surrogate as well as nursing, discussions with consultants, evaluation of patient's response to treatment, examination of patient, obtaining history from patient or surrogate, ordering and performing treatments and interventions, ordering and review of laboratory studies, ordering and review of radiographic studies, pulse oximetry and re-evaluation of patient's condition.  ____________________________________________  INITIAL IMPRESSION / ASSESSMENT AND PLAN / ED COURSE  Pertinent labs & imaging results that were available during my care of the patient were reviewed by me and considered in my medical decision making (see chart for details).  Pleasant alert 81 year old female with signs of an acute CVA. The symptoms are relatively minor. An NIH stroke scale assessment has been done with a score of 2.  A stat CT scan has been ordered. Coagulation studies are pending.  ----------------------------------------- 5:13 AM on 12/04/2014 -----------------------------------------  CT does not show any acute changes. INR is 1.15  I have spoken with the hospitalist for consultation for admission. I have counseled the patient and her family on the results and diagnosis.  ____________________________________________   FINAL CLINICAL IMPRESSION(S) / ED DIAGNOSES  Final diagnoses:  Acute CVA (cerebrovascular accident)  Left-sided weakness      Ahmed Prima, MD 12/04/14 [phone]

## 2014-12-04 NOTE — H&P (Signed)
[facility] NAME: Joyce Vasquez    MR#:  [id number]  DATE OF BIRTH:  January 15, 1917  DATE OF ADMISSION:  12/04/2014  PRIMARY CARE PHYSICIAN: Chrisandra Carota, MD   REQUESTING/REFERRING PHYSICIAN: DR.Kaminski  CHIEF COMPLAINT: Left leg are growing    Chief Complaint  Patient presents with  . Extremity Weakness    Pt. here from [facility] via EMS for lower lt. extremity pain.    HISTORY OF PRESENT ILLNESS:  Joyce Vasquez  is a 81 y.o. female with a known history of chronic atrial fibrillation, sick sinus syndrome, hypertension brought in from [facility] independent  iving facility secondary to left leg drawing.The patient's daughter she complained of left leg drawing  and that she thought it could be cramps. Patient family is concerned because of for some therapeutic INR and he has she has history of A. fib that concern about possible TIA. Patient did not have any weakness of the legs or hands. No slurred speech. No loss of consciousness. Patient INR is 1.6. She was off Coumadin for pacemaker placement. Pacemaker was placed last Tuesday and Coumadin was restarted on last Thursday. Patient denies any other problems except mild headache. BP initially was 204/106.  PAST MEDICAL HISTORY:   Past Medical History  Diagnosis Date  . Hypertension   . Arthritis     osteoarthritis  . Hyperlipidemia   . Temporal giant cell arteritis     left eye  . Atrial fibrillation   . GERD (gastroesophageal reflux disease)   . CHF (congestive heart failure)   . Glaucoma   . DJD (degenerative joint disease)   . TIA (transient ischemic attack)   . Bell's palsy   . Cancer     skin    PAST SURGICAL HISTOIRY:   Past Surgical History  Procedure Laterality Date  . Carotid endarterectomy Right   . Appendectomy    . Abdominal hysterectomy    . Cholecystectomy    . Cardiac catheterization    . Joint replacement Bilateral     Right and Left knee  Replacement  . Breast surgery Left     Excisional Breast Biopsy  . Tonsillectomy    . Eye surgery Right     Cataract Extraction--right eye only  . Pacemaker insertion Left 11/29/2014    Procedure: INSERTION PACEMAKER;  Surgeon: Isaias Cowman, MD;  Location: [facility] ORS;  Service: Cardiovascular;  Laterality: Left;    SOCIAL HISTORY:   Social History  Substance Use Topics  . Smoking status: Never Smoker   . Smokeless tobacco: Never Used  . Alcohol Use: No    FAMILY HISTORY:  History reviewed. No pertinent family history.  DRUG ALLERGIES:   Allergies  Allergen Reactions  . Erythromycin Base Nausea And Vomiting  . Penicillins Swelling    "swelling of throat"  . Morphine And Related Rash    REVIEW OF SYSTEMS:  CONSTITUTIONAL: No fever, fatigue or weakness.  EYES: No blurred or double vision.  EARS, NOSE, AND THROAT: No tinnitus or ear pain.  RESPIRATORY: No cough, shortness of breath, wheezing or hemoptysis.  CARDIOVASCULAR: No chest pain, orthopnea, edema.  GASTROINTESTINAL: No nausea, vomiting, diarrhea or abdominal pain.  GENITOURINARY: No dysuria, hematuria.  ENDOCRINE: No polyuria, nocturia,  HEMATOLOGY: No anemia, easy bruising or bleeding SKIN: No rash or lesion. MUSCULOSKELETAL: No joint pain or arthritis.   NEUROLOGIC: No tingling, numbness, weakness.  PSYCHIATRY: No anxiety or depression.   MEDICATIONS AT HOME:  Prior to Admission medications   Medication Sig Start Date End Date Taking? Authorizing Provider  aspirin EC 81 MG tablet Take 162 mg by mouth daily.   Yes Historical Provider, MD  benazepril-hydrochlorthiazide (LOTENSIN HCT) 20-25 MG per tablet Take 1 tablet by mouth daily.   Yes Historical Provider, MD  bimatoprost (LUMIGAN) 0.03 % ophthalmic solution Place 1 drop into the right eye at bedtime.   Yes Historical Provider, MD  Cyanocobalamin (VITAMIN B-12 PO) Take 2 tablets by mouth 3 (three) times a week.   Yes Historical Provider, MD   cyclobenzaprine (FLEXERIL) 5 MG tablet Take 5 mg by mouth 3 (three) times daily as needed for muscle spasms.   Yes Historical Provider, MD  diphenhydramine-acetaminophen (TYLENOL PM) 25-500 MG TABS Take 1 tablet by mouth at bedtime as needed.   Yes Historical Provider, MD  furosemide (LASIX) 20 MG tablet Take 20 mg by mouth as needed.   Yes Historical Provider, MD  ibuprofen (ADVIL,MOTRIN) 200 MG tablet Take 200 mg by mouth every 6 (six) hours as needed.   Yes Historical Provider, MD  nitroGLYCERIN (NITROSTAT) 0.4 MG SL tablet Place 0.4 mg under the tongue every 5 (five) minutes as needed for chest pain. After 3rd dose if no relief call 911   Yes Historical Provider, MD  omeprazole (PRILOSEC) 20 MG capsule Take 20 mg by mouth daily.   Yes Historical Provider, MD  oxybutynin (DITROPAN-XL) 5 MG 24 hr tablet Take 5 mg by mouth at bedtime.   Yes Historical Provider, MD  potassium chloride (KLOR-CON 10) 10 MEQ tablet Take 10 mEq by mouth daily. Take one whenever taking one furosemide as needed   Yes Historical Provider, MD  vancomycin (VANCOCIN HCL) 125 MG capsule Take 1 capsule (125 mg total) by mouth 4 (four) times daily. 11/30/14  Yes Isaias Cowman, MD  warfarin (COUMADIN) 2 MG tablet Take 2 mg by mouth 2 (two) times a week. Monday and Wednesday   Yes Historical Provider, MD  warfarin (COUMADIN) 3 MG tablet Take 3 mg by mouth once. Tuesday, Thursday, Friday, Saturday and Sunday   Yes Historical Provider, MD      VITAL SIGNS:  Blood pressure 185/86, pulse 57, temperature 97.6 F (36.4 C), resp. rate 13, height 5\' 5"  (1.651 m), weight 78.019 kg (172 lb), SpO2 97 %.  PHYSICAL EXAMINATION:  GENERAL:  81 y.o.-year-old patient lying in the bed with no acute distress. Alert and oriented for her age; memory is intact. EYES: Pupils equal, round, reactive to light and accommodation. No scleral icterus. Extraocular muscles intact.  HEENT: Head atraumatic, normocephalic. Oropharynx and nasopharynx  clear.  NECK:  Supple, no jugular venous distention. No thyroid enlargement, no tenderness.  LUNGS: Normal breath sounds bilaterally, no wheezing, rales,rhonchi or crepitation. No use of accessory muscles of respiration.  CARDIOVASCULAR: S1, S2 normal. No murmurs, rubs, or gallops.  ABDOMEN: Soft, nontender, nondistended. Bowel sounds present. No organomegaly or mass.  EXTREMITIES: No pedal edema, cyanosis, or clubbing.  NEUROLOGIC: Cranial nerves II through XII are intact. Muscle strength 5/5 in all extremities. Sensation intact. Gait not checked.  PSYCHIATRIC: The patient is alert and oriented x 3.  SKIN: No obvious rash, lesion, or ulcer.   LABORATORY PANEL:   CBC  Recent Labs Lab 12/04/14 0330  WBC 6.9  HGB 11.2*  HCT 34.5*  PLT 144*   ------------------------------------------------------------------------------------------------------------------  Chemistries   Recent Labs Lab 12/04/14 0330  NA 134*  K 4.1  CL 99*  CO2 26  GLUCOSE 106*  BUN 26*  CREATININE 0.89  CALCIUM 9.7  AST 25  ALT 16  ALKPHOS 91  BILITOT 0.4   ------------------------------------------------------------------------------------------------------------------  Cardiac Enzymes  Recent Labs Lab 12/04/14 0330  TROPONINI <0.03   ------------------------------------------------------------------------------------------------------------------  RADIOLOGY:  Ct Head Wo Contrast  12/04/2014   CLINICAL DATA:  Acute onset of left leg tightness and pain. Initial encounter.  EXAM: CT HEAD WITHOUT CONTRAST  TECHNIQUE: Contiguous axial images were obtained from the base of the skull through the vertex without intravenous contrast.  COMPARISON:  CT of the head performed 11/20/2008  FINDINGS: There is no evidence of acute infarction, mass lesion, or intra- or extra-axial hemorrhage on CT.  Prominence of the ventricles and sulci reflects mild to moderate cortical volume loss. Cerebellar atrophy is noted.  Scattered periventricular and subcortical white matter change likely reflects small vessel ischemic microangiopathy.  The brainstem and fourth ventricle are within normal limits. The basal ganglia are unremarkable in appearance. The cerebral hemispheres demonstrate grossly normal gray-white differentiation. No mass effect or midline shift is seen.  There is no evidence of fracture; visualized osseous structures are unremarkable in appearance. The visualized portions of the orbits are within normal limits. The paranasal sinuses and mastoid air cells are well-aerated. No significant soft tissue abnormalities are seen.  IMPRESSION: 1. No acute intracranial pathology seen on CT. 2. Mild to moderate cortical volume loss and scattered small vessel ischemic microangiopathy.   Electronically Signed   By: Garald Balding M.D.   On: 12/04/2014 04:54    EKG:   Orders placed or performed during the hospital encounter of 12/04/14  . ED EKG  . ED EKG  . EKG 12-Lead  . EKG 12-Lead   ekg  shows demand pacemaker 92 bpm  IMPRESSION AND PLAN:   #1 malignant hypertension initial blood pressure was 204/106. Received labetalol in the emergency room repeat blood pressure 170/90. Patient will be admitted to overnight observation for malignant hypertension, left leg  drawing.concerning for a possible TIA.  2. chronic atrial fibrillation with supple septic INR, colon started on heparin drip, pharmacy to consult for Coumadin dosing. #3 left leg cramps; I'm  thinking it could be muscle cramps,,her neurological exam is within normal limits she cannot have MRI because of pacemaker placement I ordered a carotid ultrasound . patient did have echocardiogram before the pacemaker placement EF was more thanmore than 50% so I'm not going to order repeat ,echocardiogram. We will do neurological checks the every 4 hours for the 24 hours. 4.History of glaucoma continue eyedrops 5.Chronic atrial fibrillation rate is controlled monitored on  telemetry. #6 malignant hypertension: Patient will be started on hydralazine 25 mg 3 times a day. Continue her Lotensin. #7 history of atrial fibrillation ,sick sinus syndrome, pacemaker placed she is on  Prophylactic  vancomycin now for 7 days.  Plan d/w daughter and patient   All the records are reviewed and case discussed with ED provider. Management plans discussed with the patient, family and they are in agreement.  CODE STATUS: full  TOTAL TIME TAKING CARE OF THIS PATIENT: 55 minutes.    Epifanio Lesches M.D on 12/04/2014 at 8:32 AM  Between 7am to 6pm - Pager - [phone]  After 6pm go to [email or web address] - password [email or web address] [facility]  Office  [phone]  CC: Primary care physician; Chrisandra Carota, MD

## 2014-12-04 NOTE — Progress Notes (Addendum)
ANTICOAGULATION CONSULT NOTE - Initial Consult  Pharmacy Consult for Heparin Drip Indication: atrial fibrillation  Allergies  Allergen Reactions  . Erythromycin Base Nausea And Vomiting  . Penicillins Swelling    "swelling of throat"  . Morphine And Related Rash    Patient Measurements: Height: 5\' 5"  (165.1 cm) Weight: 172 lb (78.019 kg) IBW/kg (Calculated) : 57 Heparin Dosing Weight: 73.3 kg  Vital Signs: Temp: 97.6 F (36.4 C) (08/14 0352) BP: 171/68 mmHg (08/14 0800) Pulse Rate: 62 (08/14 0800)  Labs:  Recent Labs  12/04/14 0330  HGB 11.2*  HCT 34.5*  PLT 144*  APTT 31  LABPROT 14.9  INR 1.15  CREATININE 0.89  TROPONINI <0.03    Estimated Creatinine Clearance: 39 mL/min (by C-G formula based on Cr of 0.89).   Medical History: Past Medical History  Diagnosis Date  . Hypertension   . Arthritis     osteoarthritis  . Hyperlipidemia   . Temporal giant cell arteritis     left eye  . Atrial fibrillation   . GERD (gastroesophageal reflux disease)   . CHF (congestive heart failure)   . Glaucoma   . DJD (degenerative joint disease)   . TIA (transient ischemic attack)   . Bell's palsy   . Cancer     skin    Assessment: Patient is a 81 yo female admitted with hypertension and possible TIA.  Patient with chronic afib requiring wafarin therapy.  INR on admission of 1.15.  Aptt: 31, Hgb: 11.2, Plts: 144  Goal of Therapy:  Heparin level 0.3-0.7 units/ml Monitor platelets by anticoagulation protocol: Yes   Plan:  Give 4000 units bolus x 1 Start heparin infusion at 1050 units/hr Check anti-Xa level in 8 hours and daily while on heparin Continue to monitor H&H and platelets  Scarpena,Crystal G 12/04/2014,10:05 AM  8/14 @ 1730 Heparin Level was 0.71.  Reduced infusion rate to 950 units/hr and will recheck Heparin Level in 8 hr Aasir Daigler K 12/04/2014

## 2014-12-05 ENCOUNTER — Observation Stay: Payer: Medicare Other

## 2014-12-05 DIAGNOSIS — R531 Weakness: Secondary | ICD-10-CM | POA: Diagnosis not present

## 2014-12-05 LAB — PROTIME-INR
INR: 1.41
Prothrombin Time: 17.5 seconds — ABNORMAL HIGH (ref 11.4–15.0)

## 2014-12-05 LAB — CBC
HEMATOCRIT: 33.8 % — AB (ref 35.0–47.0)
HEMOGLOBIN: 11.1 g/dL — AB (ref 12.0–16.0)
MCH: 28.8 pg (ref 26.0–34.0)
MCHC: 32.8 g/dL (ref 32.0–36.0)
MCV: 87.6 fL (ref 80.0–100.0)
Platelets: 143 10*3/uL — ABNORMAL LOW (ref 150–440)
RBC: 3.86 MIL/uL (ref 3.80–5.20)
RDW: 16.2 % — ABNORMAL HIGH (ref 11.5–14.5)
WBC: 7.3 10*3/uL (ref 3.6–11.0)

## 2014-12-05 LAB — HEPARIN LEVEL (UNFRACTIONATED)
Heparin Unfractionated: 0.51 IU/mL (ref 0.30–0.70)
Heparin Unfractionated: 0.71 IU/mL — ABNORMAL HIGH (ref 0.30–0.70)

## 2014-12-05 LAB — GLUCOSE, CAPILLARY
GLUCOSE-CAPILLARY: 89 mg/dL (ref 65–99)
GLUCOSE-CAPILLARY: 118 mg/dL — AB (ref 65–99)

## 2014-12-05 LAB — BASIC METABOLIC PANEL
ANION GAP: 7 (ref 5–15)
BUN: 21 mg/dL — ABNORMAL HIGH (ref 6–20)
CO2: 27 mmol/L (ref 22–32)
Calcium: 9.1 mg/dL (ref 8.9–10.3)
Chloride: 99 mmol/L — ABNORMAL LOW (ref 101–111)
Creatinine, Ser: 0.91 mg/dL (ref 0.44–1.00)
GFR calc Af Amer: >60 mL/min (ref >60)
GFR calc non Af Amer: 52 mL/min — ABNORMAL LOW (ref >60)
GLUCOSE: 93 mg/dL (ref 65–99)
POTASSIUM: 3.5 mmol/L (ref 3.5–5.1)
Sodium: 133 mmol/L — ABNORMAL LOW (ref 135–145)

## 2014-12-05 MED ORDER — DOCUSATE SODIUM 100 MG PO CAPS: 100.0000 mg | ORAL_CAPSULE | Freq: Two times a day (BID) | ORAL | Status: DC

## 2014-12-05 MED ORDER — HYDRALAZINE HCL 25 MG PO TABS: 25.0000 mg | ORAL_TABLET | Freq: Three times a day (TID) | ORAL | Status: DC

## 2014-12-05 MED ORDER — WARFARIN SODIUM 4 MG PO TABS: 4.0000 mg | ORAL_TABLET | Freq: Every day | ORAL | Status: DC

## 2014-12-05 NOTE — Progress Notes (Signed)
ANTICOAGULATION CONSULT NOTE - Initial Consult  Pharmacy Consult for Heparin Drip Indication: atrial fibrillation  Allergies  Allergen Reactions  . Erythromycin Base Nausea And Vomiting  . Penicillins Swelling    "swelling of throat"  . Cefuroxime Axetil Nausea Only and Rash  . Morphine And Related Rash    Patient Measurements: Height: 5\' 5"  (165.1 cm) Weight: 172 lb (78.019 kg) IBW/kg (Calculated) : 57 Heparin Dosing Weight: 73.3 kg  Vital Signs: Temp: 97.9 F (36.6 C) (08/14 2052) Temp Source: Oral (08/14 2052) BP: 149/49 mmHg (08/14 2052) Pulse Rate: 73 (08/14 2052)  Labs:  Recent Labs  12/04/14 0330 12/04/14 1742 12/05/14 0238  HGB 11.2*  --  11.1*  HCT 34.5*  --  33.8*  PLT 144*  --  143*  APTT 31  --   --   LABPROT 14.9  --  17.5*  INR 1.15  --  1.41  HEPARINUNFRC  --  0.71* 0.51  CREATININE 0.89  --  0.91  TROPONINI <0.03  --   --     Estimated Creatinine Clearance: 38.2 mL/min (by C-G formula based on Cr of 0.91).   Medical History: Past Medical History  Diagnosis Date  . Hypertension   . Arthritis     osteoarthritis  . Hyperlipidemia   . Temporal giant cell arteritis     left eye  . Atrial fibrillation   . GERD (gastroesophageal reflux disease)   . CHF (congestive heart failure)   . Glaucoma   . DJD (degenerative joint disease)   . TIA (transient ischemic attack)   . Bell's palsy   . Cancer     skin    Assessment: Patient is a 81 yo female admitted with hypertension and possible TIA.  Patient with chronic afib requiring wafarin therapy.  INR on admission of 1.15.  Aptt: 31, Hgb: 11.2, Plts: 144  Goal of Therapy:  Heparin level 0.3-0.7 units/ml Monitor platelets by anticoagulation protocol: Yes   Plan:  Give 4000 units bolus x 1 Start heparin infusion at 1050 units/hr Check anti-Xa level in 8 hours and daily while on heparin Continue to monitor H&H and platelets   8/14 @ 1730 Heparin Level was 0.71.  Reduced infusion rate to  950 units/hr and will recheck Heparin Level in 8 hr  8/15 0200 heparin level 0.51. Recheck in 8 hours to confirm.  Sim Boast, PharmD, BCPS  12/05/2014

## 2014-12-05 NOTE — Clinical Social Work Note (Signed)
Patient to discharge today back to her independent apartment at [facility]. MD had thought this was a skilled nursing home and thus consulted CSW and it is not. CSW signing off. Shela Leff MSW,LCSW

## 2014-12-09 NOTE — Discharge Summary (Signed)
Joyce Vasquez, is a 81 y.o. female  DOB [birth year]  MRN [id number].  Admission date:  12/04/2014  Admitting Physician  Epifanio Lesches, MD  Discharge Date:  12/05/2014   Primary MD  Chrisandra Carota, MD  Recommendations for primary care physician for things to follow:   Follow-up with her primary doctor Dr. Norman Clay in one week in 1 week   Admission Diagnosis  Dizziness [R42] Left-sided weakness [M62.89] Acute CVA (cerebrovascular accident) [I63.9]   Discharge Diagnosis  Dizziness [R42] Left-sided weakness [M62.89] Acute CVA (cerebrovascular accident) [I63.9]    Active Problems:   HTN (hypertension), malignant      Past Medical History  Diagnosis Date  . Hypertension   . Arthritis     osteoarthritis  . Hyperlipidemia   . Temporal giant cell arteritis     left eye  . Atrial fibrillation   . GERD (gastroesophageal reflux disease)   . CHF (congestive heart failure)   . Glaucoma   . DJD (degenerative joint disease)   . TIA (transient ischemic attack)   . Bell's palsy   . Cancer     skin    Past Surgical History  Procedure Laterality Date  . Carotid endarterectomy Right   . Appendectomy    . Abdominal hysterectomy    . Cholecystectomy    . Cardiac catheterization    . Joint replacement Bilateral     Right and Left knee Replacement  . Breast surgery Left     Excisional Breast Biopsy  . Tonsillectomy    . Eye surgery Right     Cataract Extraction--right eye only  . Pacemaker insertion Left 11/29/2014    Procedure: INSERTION PACEMAKER;  Surgeon: Isaias Cowman, MD;  Location: [facility] ORS;  Service: Cardiovascular;  Laterality: Left;       History of present illness and  Hospital Course:     Kindly see H&P for history of present illness and admission details, please review complete Labs,  Consult reports and Test reports for all details in brief  HPI  from the history and physical done on the day of admission 81 year old female patient with chronic atrial fibrillation, sick sinus syndrome status post pacemaker recently brought in from the original independent facility secondary to left leg drying. Concerning this we admitted her past for possible TIA. INR and admission was 1.6 patient was off Coumadin now for pacemaker placement. Initial blood pressure was 204/106. And she also was admitted for malignant hypertension.    Hospital Course  #1 left leg drying patient admitted to telemetry for TIA evaluation: CT head did not show any new stroke 7 and patient could not have MRI of the brain secondary to pacemaker placement. Patient neurological exam remained stable and she did not have any neurological deficit, her speech is clear. Patient to carotid ultrasound did not show any hemodynamically significant plaque. Patient discharged back to [facility] independent apartment. Daughter is aware of that.  #2 malignant hypertension. Patient did not have any chest pain, shortness of breath and initial blood pressure was high but after adding the hydralazine blood pressure  improved.. Patient was taking benazepril with HCTZ, Lasix. I have added hydralazine to her medications. Patient is advised to follow per Dr. Nehemiah Massed as an outpatient because of  her recent pacemaker placement. #3 chronic atrial fibrillation rate controlled patient is on Coumadin now . Coumadin discharged with different dose of Coumadin at 4 mg daily and I advised her to follow up with her primary doctor in  about 2 days  after discharge to make sure INR is therapeutic and decrease the dose of Coumadin accordingly.   Discharge Condition: Stable   Follow UP  Follow-up Information    Follow up with Chrisandra Carota, MD On 12/07/2014.   Specialty:  Family Medicine   Why:  for PT/INR check at 10:00am   Contact information:    [place] [address] [phone]       Follow up with Corey Skains, MD.   Specialty:  Internal Medicine   Why:  Please keep scheduled appointment with Dr. Nehemiah Massed (cardiology).  Bring discharge paperwork with you to appointment.  Keep record of your Blood Pressures daily.     Contact information:   [place] [address] [phone]         Discharge Instructions  and  Discharge Medications        Medication List    STOP taking these medications        vancomycin 125 MG capsule  Commonly known as:  VANCOCIN HCL      TAKE these medications        aspirin EC 81 MG tablet  Take 162 mg by mouth daily.     benazepril-hydrochlorthiazide 20-25 MG per tablet  Commonly known as:  LOTENSIN HCT  Take 1 tablet by mouth daily.     bimatoprost 0.03 % ophthalmic solution  Commonly known as:  LUMIGAN  Place 1 drop into the right eye at bedtime.     cyclobenzaprine 5 MG tablet  Commonly known as:  FLEXERIL  Take 5 mg by mouth 3 (three) times daily as needed for muscle spasms.     diphenhydramine-acetaminophen 25-500 MG Tabs  Commonly known as:  TYLENOL PM  Take 1 tablet by mouth at bedtime as needed.     docusate sodium 100 MG capsule  Commonly known as:  COLACE  Take 1 capsule (100 mg total) by mouth 2 (two) times daily.     furosemide 20 MG tablet  Commonly known as:  LASIX  Take 20 mg by mouth as needed.     hydrALAZINE 25 MG tablet  Commonly known as:  APRESOLINE  Take 1 tablet (25 mg total) by mouth every 8 (eight) hours.     ibuprofen 200 MG tablet  Commonly known as:  ADVIL,MOTRIN  Take 200 mg by mouth every 6 (six) hours as needed.     KLOR-CON 10 10 MEQ tablet  Generic drug:  potassium chloride  Take 10 mEq by mouth daily. Take one whenever taking one furosemide as needed     nitroGLYCERIN 0.4 MG SL tablet  Commonly known as:  NITROSTAT  Place 0.4 mg under the tongue every 5 (five) minutes as needed for  chest pain. After 3rd dose if no relief call 911     omeprazole 20 MG capsule  Commonly known as:  PRILOSEC  Take 20 mg by mouth daily.     oxybutynin 5 MG 24 hr tablet  Commonly known as:  DITROPAN-XL  Take 5 mg by mouth at bedtime.     VITAMIN B-12 PO  Take 2 tablets by mouth 3 (three) times a week.     warfarin 4 MG tablet  Commonly known as:  COUMADIN  Take 1 tablet (4 mg total) by mouth daily.          Diet and Activity recommendation: See Discharge Instructions above   Consults obtained - none   Major procedures and  Radiology Reports - PLEASE review detailed and final reports for all details, in brief -      Dg Chest 2 View  11/23/2014   CLINICAL DATA:  Preoperative for pacemaker insertion, history of CHF, previous MI, cardiac dysrhythmia.  EXAM: CHEST  2 VIEW  COMPARISON:  PA and lateral chest x-ray of May 08, 2014 and November 20, 2008.  FINDINGS: The lungs are well-expanded. There is no focal infiltrate. The interstitial markings are mildly prominent bilaterally but stable. The heart is top-normal in size. The pulmonary vascularity is not engorged. There is mild tortuosity of the descending thoracic aorta. There is mild stable soft tissue prominence in the right paratracheal region. The bony thorax exhibits no acute abnormalities.  IMPRESSION: There is no acute cardiopulmonary abnormality. Stable mild chronic interstitial prominence in both lungs.   Electronically Signed   By: David  Martinique M.D.   On: 11/23/2014 13:05   Ct Head Wo Contrast  12/04/2014   CLINICAL DATA:  Acute onset of left leg tightness and pain. Initial encounter.  EXAM: CT HEAD WITHOUT CONTRAST  TECHNIQUE: Contiguous axial images were obtained from the base of the skull through the vertex without intravenous contrast.  COMPARISON:  CT of the head performed 11/20/2008  FINDINGS: There is no evidence of acute infarction, mass lesion, or intra- or extra-axial hemorrhage on CT.  Prominence of the ventricles  and sulci reflects mild to moderate cortical volume loss. Cerebellar atrophy is noted. Scattered periventricular and subcortical white matter change likely reflects small vessel ischemic microangiopathy.  The brainstem and fourth ventricle are within normal limits. The basal ganglia are unremarkable in appearance. The cerebral hemispheres demonstrate grossly normal gray-white differentiation. No mass effect or midline shift is seen.  There is no evidence of fracture; visualized osseous structures are unremarkable in appearance. The visualized portions of the orbits are within normal limits. The paranasal sinuses and mastoid air cells are well-aerated. No significant soft tissue abnormalities are seen.  IMPRESSION: 1. No acute intracranial pathology seen on CT. 2. Mild to moderate cortical volume loss and scattered small vessel ischemic microangiopathy.   Electronically Signed   By: Garald Balding M.D.   On: 12/04/2014 04:54   US Carotid Bilateral  12/05/2014   CLINICAL DATA:  81 year old female with dizziness  EXAM: BILATERAL CAROTID DUPLEX ULTRASOUND  TECHNIQUE: Pearline Cables scale imaging, color Doppler and duplex ultrasound were performed of bilateral carotid and vertebral arteries in the neck.  COMPARISON:  CT scan of the head 12/04/2014; prior duplex carotid ultrasound 03/30/2012  FINDINGS: Criteria: Quantification of carotid stenosis is based on velocity parameters that correlate the residual internal carotid diameter with NASCET-based stenosis levels, using the diameter of the distal internal carotid lumen as the denominator for stenosis measurement.  The following velocity measurements were obtained:  RIGHT  ICA:  140/18 cm/sec  CCA:  84/6 cm/sec  SYSTOLIC ICA/CCA RATIO:  1.8  DIASTOLIC ICA/CCA RATIO:  2.6  ECA:  79 cm/sec  LEFT  ICA:  96/16 cm/sec  CCA:  96/2 cm/sec  SYSTOLIC ICA/CCA RATIO:  1.4  DIASTOLIC ICA/CCA RATIO:  2.0  ECA:  67 cm/sec  RIGHT CAROTID ARTERY: Tortuous common carotid artery. Intimal medial  thickening is noted distally in the common carotid artery extending into the proximal internal carotid artery. By peak systolic velocity criteria, there is an estimated 50- 69% diameter stenosis in the mid internal carotid artery.  RIGHT VERTEBRAL ARTERY:  Patent with normal antegrade flow.  LEFT CAROTID ARTERY: Mild heterogeneous atherosclerotic plaque in  the proximal internal carotid artery. By peak systolic velocity criteria, the estimated stenosis remains less than 50%.  LEFT VERTEBRAL ARTERY:  Patent with normal antegrade flow.  IMPRESSION: 1. Moderate (50- 69% diameter) stenosis mid right internal carotid artery secondary to relatively smooth heterogeneous atherosclerotic plaque. 2. Mild (1-49% diameter) stenosis proximal left internal carotid artery secondary to patchy heterogeneous atherosclerotic plaque. 3. Vertebral arteries are patent with normal antegrade flow.  Signed,  Criselda Peaches, MD  [facility]  [facility]   Electronically Signed   By: Jacqulynn Cadet M.D.   On: 12/05/2014 13:38   Dg Chest Port 1 View  11/29/2014   CLINICAL DATA:  81 year old female status post pacemaker placement. Initial encounter.  EXAM: PORTABLE CHEST - 1 VIEW  COMPARISON:  11/23/2014 and earlier.  FINDINGS: Portable AP upright view at 1328 hours. New left chest single lead transvenous cardiac pacemaker. The lead courses to the RV apex region. Stable cardiomegaly and mediastinal contours. No pneumothorax or pulmonary edema. Mildly lower lung volumes. Visualized tracheal air column is within normal limits. Calcified aortic atherosclerosis.  IMPRESSION: Left chest single lead cardiac pacemaker placed with no adverse features.   Electronically Signed   By: Genevie Ann M.D.   On: 11/29/2014 14:13   Dg C-arm 1-60 Min-no Report  11/29/2014   CLINICAL DATA: Pacemaker insertion   C-ARM 1-60 MINUTES  Fluoroscopy was utilized by the requesting physician.  No radiographic   interpretation.     Micro Results    No results found for this or any previous visit (from the past 240 hour(s)).     Today   Subjective:   Joyce Vasquez today has no headache,no chest abdominal pain,no new weakness tingling or numbness, feels much better wants to go home today.   Objective:   Blood pressure 154/58, pulse 64, temperature 98.2 F (36.8 C), temperature source Oral, resp. rate 18, height 5\' 5"  (1.651 m), weight 74.798 kg (164 lb 14.4 oz), SpO2 100 %.  No intake or output data in the 24 hours ending 12/09/14 0747  Exam Awake Alert, Oriented x 3, No new F.N deficits, Normal affect [place].AT,PERRAL Supple Neck,No JVD, No cervical lymphadenopathy appriciated.  Symmetrical Chest wall movement, Good air movement bilaterally, CTAB RRR,No Gallops,Rubs or new Murmurs, No Parasternal Heave +ve B.Sounds, Abd Soft, Non tender, No organomegaly appriciated, No rebound -guarding or rigidity. No Cyanosis, Clubbing or edema, No new Rash or bruise  Data Review   CBC w Diff:  Lab Results  Component Value Date   WBC 7.3 12/05/2014   WBC 11.5* 05/08/2014   HGB 11.1* 12/05/2014   HGB 12.6 05/08/2014   HCT 33.8* 12/05/2014   HCT 38.5 05/08/2014   PLT 143* 12/05/2014   PLT 188 05/08/2014   LYMPHOPCT 27 12/04/2014   MONOPCT 8 12/04/2014   EOSPCT 3 12/04/2014   BASOPCT 1 12/04/2014    CMP:  Lab Results  Component Value Date   NA 133* 12/05/2014   NA 137 05/08/2014   K 3.5 12/05/2014   K 3.7 05/08/2014   CL 99* 12/05/2014   CL 102 05/08/2014   CO2 27 12/05/2014   CO2 25 05/08/2014   BUN 21* 12/05/2014   BUN 26* 05/08/2014   CREATININE 0.91 12/05/2014   CREATININE 1.06 05/08/2014   PROT 6.8 12/04/2014   PROT 7.1 05/08/2014   ALBUMIN 3.8 12/04/2014   ALBUMIN 3.4 05/08/2014   BILITOT 0.4 12/04/2014   BILITOT 0.4 05/08/2014   ALKPHOS 91 12/04/2014  ALKPHOS 121* 05/08/2014   AST 25 12/04/2014   AST 28 05/08/2014   ALT 16 12/04/2014   ALT 19 05/08/2014   .   Total Time in preparing paper work, data evaluation and todays exam - 46 minutes  Shanora Christensen M.D on 12/05/2014 at 7:47 AM

## 2015-01-28 ENCOUNTER — Inpatient Hospital Stay
Admission: EM | Admit: 2015-01-28 | Discharge: 2015-02-04 | DRG: 644 | Disposition: A | Discharge status: Home / Self Care | Payer: Medicare Other | Attending: Internal Medicine | Admitting: Internal Medicine

## 2015-01-28 ENCOUNTER — Encounter: Payer: Self-pay | Admitting: *Deleted

## 2015-01-28 DIAGNOSIS — Z95828 Presence of other vascular implants and grafts: Secondary | ICD-10-CM

## 2015-01-28 DIAGNOSIS — T502X5A Adverse effect of carbonic-anhydrase inhibitors, benzothiadiazides and other diuretics, initial encounter: Secondary | ICD-10-CM | POA: Diagnosis present

## 2015-01-28 DIAGNOSIS — G510 Bell's palsy: Secondary | ICD-10-CM | POA: Diagnosis present

## 2015-01-28 DIAGNOSIS — Z96653 Presence of artificial knee joint, bilateral: Secondary | ICD-10-CM | POA: Diagnosis present

## 2015-01-28 DIAGNOSIS — Z8249 Family history of ischemic heart disease and other diseases of the circulatory system: Secondary | ICD-10-CM | POA: Diagnosis not present

## 2015-01-28 DIAGNOSIS — B9620 Unspecified Escherichia coli [E. coli] as the cause of diseases classified elsewhere: Secondary | ICD-10-CM | POA: Diagnosis present

## 2015-01-28 DIAGNOSIS — E785 Hyperlipidemia, unspecified: Secondary | ICD-10-CM | POA: Diagnosis present

## 2015-01-28 DIAGNOSIS — Z87440 Personal history of urinary (tract) infections: Secondary | ICD-10-CM | POA: Diagnosis not present

## 2015-01-28 DIAGNOSIS — Z888 Allergy status to other drugs, medicaments and biological substances status: Secondary | ICD-10-CM | POA: Diagnosis not present

## 2015-01-28 DIAGNOSIS — E876 Hypokalemia: Secondary | ICD-10-CM | POA: Diagnosis present

## 2015-01-28 DIAGNOSIS — I110 Hypertensive heart disease with heart failure: Secondary | ICD-10-CM | POA: Diagnosis present

## 2015-01-28 DIAGNOSIS — E222 Syndrome of inappropriate secretion of antidiuretic hormone: Secondary | ICD-10-CM | POA: Diagnosis present

## 2015-01-28 DIAGNOSIS — I482 Chronic atrial fibrillation: Secondary | ICD-10-CM | POA: Diagnosis present

## 2015-01-28 DIAGNOSIS — Z66 Do not resuscitate: Secondary | ICD-10-CM | POA: Diagnosis present

## 2015-01-28 DIAGNOSIS — M1990 Unspecified osteoarthritis, unspecified site: Secondary | ICD-10-CM | POA: Diagnosis present

## 2015-01-28 DIAGNOSIS — E871 Hypo-osmolality and hyponatremia: Secondary | ICD-10-CM

## 2015-01-28 DIAGNOSIS — Z8673 Personal history of transient ischemic attack (TIA), and cerebral infarction without residual deficits: Secondary | ICD-10-CM | POA: Diagnosis not present

## 2015-01-28 DIAGNOSIS — Z85828 Personal history of other malignant neoplasm of skin: Secondary | ICD-10-CM | POA: Diagnosis not present

## 2015-01-28 DIAGNOSIS — K219 Gastro-esophageal reflux disease without esophagitis: Secondary | ICD-10-CM | POA: Diagnosis present

## 2015-01-28 DIAGNOSIS — Z7901 Long term (current) use of anticoagulants: Secondary | ICD-10-CM | POA: Diagnosis not present

## 2015-01-28 DIAGNOSIS — Z950 Presence of cardiac pacemaker: Secondary | ICD-10-CM | POA: Diagnosis not present

## 2015-01-28 DIAGNOSIS — Z9841 Cataract extraction status, right eye: Secondary | ICD-10-CM

## 2015-01-28 DIAGNOSIS — N390 Urinary tract infection, site not specified: Secondary | ICD-10-CM | POA: Diagnosis present

## 2015-01-28 DIAGNOSIS — R0602 Shortness of breath: Secondary | ICD-10-CM

## 2015-01-28 DIAGNOSIS — H409 Unspecified glaucoma: Secondary | ICD-10-CM | POA: Diagnosis present

## 2015-01-28 DIAGNOSIS — Z7982 Long term (current) use of aspirin: Secondary | ICD-10-CM | POA: Diagnosis not present

## 2015-01-28 DIAGNOSIS — Z9049 Acquired absence of other specified parts of digestive tract: Secondary | ICD-10-CM | POA: Diagnosis not present

## 2015-01-28 DIAGNOSIS — Z90710 Acquired absence of both cervix and uterus: Secondary | ICD-10-CM

## 2015-01-28 DIAGNOSIS — M316 Other giant cell arteritis: Secondary | ICD-10-CM | POA: Diagnosis present

## 2015-01-28 DIAGNOSIS — Z880 Allergy status to penicillin: Secondary | ICD-10-CM

## 2015-01-28 DIAGNOSIS — Z79899 Other long term (current) drug therapy: Secondary | ICD-10-CM

## 2015-01-28 DIAGNOSIS — I509 Heart failure, unspecified: Secondary | ICD-10-CM | POA: Diagnosis present

## 2015-01-28 DIAGNOSIS — Z9889 Other specified postprocedural states: Secondary | ICD-10-CM | POA: Diagnosis not present

## 2015-01-28 DIAGNOSIS — R5383 Other fatigue: Secondary | ICD-10-CM

## 2015-01-28 LAB — CBC
HEMATOCRIT: 32.3 % — AB (ref 35.0–47.0)
Hemoglobin: 10.8 g/dL — ABNORMAL LOW (ref 12.0–16.0)
MCH: 28.9 pg (ref 26.0–34.0)
MCHC: 33.4 g/dL (ref 32.0–36.0)
MCV: 86.5 fL (ref 80.0–100.0)
PLATELETS: 143 10*3/uL — AB (ref 150–440)
RBC: 3.73 MIL/uL — ABNORMAL LOW (ref 3.80–5.20)
RDW: 14.7 % — AB (ref 11.5–14.5)
WBC: 7 10*3/uL (ref 3.6–11.0)

## 2015-01-28 LAB — COMPREHENSIVE METABOLIC PANEL
ALBUMIN: 4 g/dL (ref 3.5–5.0)
ALT: 19 U/L (ref 14–54)
AST: 31 U/L (ref 15–41)
Alkaline Phosphatase: 93 U/L (ref 38–126)
Anion gap: 9 (ref 5–15)
BUN: 27 mg/dL — AB (ref 6–20)
CHLORIDE: 86 mmol/L — AB (ref 101–111)
CO2: 25 mmol/L (ref 22–32)
CREATININE: 0.91 mg/dL (ref 0.44–1.00)
Calcium: 9 mg/dL (ref 8.9–10.3)
GFR calc Af Amer: >60 mL/min (ref >60)
GFR, EST NON AFRICAN AMERICAN: 52 mL/min — AB (ref >60)
GLUCOSE: 105 mg/dL — AB (ref 65–99)
Potassium: 3.5 mmol/L (ref 3.5–5.1)
Sodium: 120 mmol/L — ABNORMAL LOW (ref 135–145)
Total Bilirubin: 0.6 mg/dL (ref 0.3–1.2)
Total Protein: 6.7 g/dL (ref 6.5–8.1)

## 2015-01-28 LAB — PROTIME-INR
INR: 1.57
Prothrombin Time: 19 seconds — ABNORMAL HIGH (ref 11.4–15.0)

## 2015-01-28 LAB — SODIUM, URINE, RANDOM: Sodium, Ur: 53 mmol/L

## 2015-01-28 LAB — OSMOLALITY, URINE: Osmolality, Ur: 261 mOsm/kg — ABNORMAL LOW (ref 300–900)

## 2015-01-28 MED ORDER — OXYBUTYNIN CHLORIDE ER 5 MG PO TB24
5.0000 mg | ORAL_TABLET | Freq: Every day | ORAL | Status: DC
  Administered 2015-01-28 – 2015-02-03 (×7): 5 mg via ORAL
  Filled 2015-01-28 (×10): qty 1

## 2015-01-28 MED ORDER — SODIUM CHLORIDE 0.9 % IV SOLN
Freq: Once | INTRAVENOUS | Status: AC
  Administered 2015-01-28: 17:00:00 via INTRAVENOUS

## 2015-01-28 MED ORDER — SODIUM CHLORIDE 0.9 % IV SOLN
INTRAVENOUS | Status: AC
  Administered 2015-01-28: 23:00:00 via INTRAVENOUS

## 2015-01-28 MED ORDER — BENAZEPRIL-HYDROCHLOROTHIAZIDE 20-25 MG PO TABS: 1.0000 | ORAL_TABLET | Freq: Every day | ORAL | Status: DC

## 2015-01-28 MED ORDER — WARFARIN - PHARMACIST DOSING INPATIENT
Freq: Every day | Status: DC
  Administered 2015-01-29 – 2015-02-01 (×4)

## 2015-01-28 MED ORDER — VITAMIN B-12 1000 MCG PO TABS
1000.0000 ug | ORAL_TABLET | Freq: Every day | ORAL | Status: DC
  Administered 2015-01-29 – 2015-02-04 (×7): 1000 ug via ORAL
  Filled 2015-01-28 (×7): qty 1

## 2015-01-28 MED ORDER — ACETAMINOPHEN 325 MG PO TABS
650.0000 mg | ORAL_TABLET | Freq: Four times a day (QID) | ORAL | Status: DC | PRN
  Administered 2015-01-28 – 2015-02-03 (×4): 650 mg via ORAL
  Filled 2015-01-28 (×4): qty 2

## 2015-01-28 MED ORDER — WARFARIN SODIUM 4 MG PO TABS
4.5000 mg | ORAL_TABLET | Freq: Once | ORAL | Status: AC
  Administered 2015-01-28: 4.5 mg via ORAL
  Filled 2015-01-28: qty 1

## 2015-01-28 MED ORDER — DIPHENHYDRAMINE HCL 25 MG PO CAPS
25.0000 mg | ORAL_CAPSULE | Freq: Every evening | ORAL | Status: DC | PRN
Start: 2015-01-28 — End: 2015-02-04
  Administered 2015-01-28 – 2015-02-02 (×3): 25 mg via ORAL
  Filled 2015-01-28 (×4): qty 1

## 2015-01-28 MED ORDER — ONDANSETRON HCL 4 MG/2ML IJ SOLN
4.0000 mg | Freq: Four times a day (QID) | INTRAMUSCULAR | Status: DC | PRN
  Administered 2015-01-31: 4 mg via INTRAVENOUS
  Filled 2015-01-28: qty 2

## 2015-01-28 MED ORDER — DOCUSATE SODIUM 100 MG PO CAPS
100.0000 mg | ORAL_CAPSULE | Freq: Every day | ORAL | Status: DC
  Administered 2015-01-29 – 2015-02-04 (×6): 100 mg via ORAL
  Filled 2015-01-28 (×8): qty 1

## 2015-01-28 MED ORDER — METOPROLOL SUCCINATE ER 25 MG PO TB24
25.0000 mg | ORAL_TABLET | Freq: Every day | ORAL | Status: DC
  Administered 2015-01-28 – 2015-02-02 (×6): 25 mg via ORAL
  Filled 2015-01-28 (×6): qty 1

## 2015-01-28 MED ORDER — HYDRALAZINE HCL 25 MG PO TABS
25.0000 mg | ORAL_TABLET | Freq: Three times a day (TID) | ORAL | Status: DC
  Administered 2015-01-29 – 2015-02-02 (×12): 25 mg via ORAL
  Filled 2015-01-28 (×12): qty 1

## 2015-01-28 MED ORDER — ONDANSETRON HCL 4 MG PO TABS: 4.0000 mg | ORAL_TABLET | Freq: Four times a day (QID) | ORAL | Status: DC | PRN

## 2015-01-28 MED ORDER — NITROGLYCERIN 0.4 MG SL SUBL
0.4000 mg | SUBLINGUAL_TABLET | SUBLINGUAL | Status: DC | PRN
  Administered 2015-01-31 (×2): 0.4 mg via SUBLINGUAL
  Filled 2015-01-28 (×2): qty 1

## 2015-01-28 MED ORDER — ASPIRIN 325 MG PO TABS
162.5000 mg | ORAL_TABLET | Freq: Every day | ORAL | Status: DC
  Administered 2015-01-29 – 2015-02-04 (×7): 162.5 mg via ORAL
  Filled 2015-01-28 (×7): qty 1

## 2015-01-28 MED ORDER — HYDRALAZINE HCL 20 MG/ML IJ SOLN
10.0000 mg | INTRAMUSCULAR | Status: DC | PRN
  Administered 2015-02-01: 10 mg via INTRAVENOUS
  Filled 2015-01-28: qty 1

## 2015-01-28 MED ORDER — LATANOPROST 0.005 % OP SOLN
1.0000 [drp] | Freq: Every day | OPHTHALMIC | Status: DC
  Administered 2015-01-28 – 2015-02-03 (×7): 1 [drp] via OPHTHALMIC
  Filled 2015-01-28: qty 2.5

## 2015-01-28 MED ORDER — PANTOPRAZOLE SODIUM 40 MG PO TBEC
40.0000 mg | DELAYED_RELEASE_TABLET | Freq: Every day | ORAL | Status: DC
  Administered 2015-01-29 – 2015-02-04 (×7): 40 mg via ORAL
  Filled 2015-01-28 (×7): qty 1

## 2015-01-28 MED ORDER — DOXYCYCLINE HYCLATE 100 MG PO TABS
100.0000 mg | ORAL_TABLET | Freq: Two times a day (BID) | ORAL | Status: DC
Start: 2015-01-28 — End: 2015-01-30
  Administered 2015-01-29 – 2015-01-30 (×3): 100 mg via ORAL
  Filled 2015-01-28 (×3): qty 1

## 2015-01-28 NOTE — Progress Notes (Signed)
Pt requesting food, MD had placed NPO due to ams.  Will do swallow screen to see if pt appropriate, daughter says she has no problems at home.  Daughter/pt also requesting nightly medications- coumadin/metoprolol/stool softeners/sleeping medication. MD- Dr. Lavetta Nielsen notified. MD changed diet order and restarting some home medications.  Will continue to monitor and watch closely for aspiration precautions.   Jessee Avers

## 2015-01-28 NOTE — H&P (Signed)
[facility] [place] NAME: Joyce Vasquez    MR#:  [id number]  DATE OF BIRTH:  Aug 11, 1916  DATE OF ADMISSION:  01/28/2015  PRIMARY CARE PHYSICIAN: Chrisandra Carota, MD   REQUESTING/REFERRING PHYSICIAN:Quale  CHIEF COMPLAINT:   Altered mental status and abnormal labs HISTORY OF PRESENT ILLNESS:  Joyce Vasquez  is a 81 Joyce.o. female with a known history of hypertension, chronic congestive heart failure, Bell's policy, hyperlipidemia, chronic atrial fibrillation on Coumadin and multiple other medical problems was seen by her primary care physician yesterday as she has been pleasantly confused of for the past 2 weeks and diagnosed with urinary tract infection and was started on doxycycline yesterday. Patient's primary care physician Dr. Vickki Muff has called patient's daughter today with abnormal labs and recommended the patient to go to the ED for low sodium. Patient's sodium is at 120 and has received 1 L IV fluid bolus in the ED. Patient came today pleasantly confused during my examination. Daughter is reporting that patient was started on hydrochlorothiazide during her recent hospital admission in August  PAST MEDICAL HISTORY:   Past Medical History  Diagnosis Date  . Hypertension   . Arthritis     osteoarthritis  . Hyperlipidemia   . Temporal giant cell arteritis (HCC)     left eye  . Atrial fibrillation ([place])   . GERD (gastroesophageal reflux disease)   . CHF (congestive heart failure) ([place])   . Glaucoma   . DJD (degenerative joint disease)   . TIA (transient ischemic attack)   . Bell's palsy   . Cancer ([place])     skin    PAST SURGICAL HISTOIRY:   Past Surgical History  Procedure Laterality Date  . Carotid endarterectomy Right   . Appendectomy    . Abdominal hysterectomy    . Cholecystectomy    . Cardiac catheterization    . Joint replacement Bilateral     Right and Left knee Replacement  . Breast surgery Left     Excisional  Breast Biopsy  . Tonsillectomy    . Eye surgery Right     Cataract Extraction--right eye only  . Pacemaker insertion Left 11/29/2014    Procedure: INSERTION PACEMAKER;  Surgeon: Isaias Cowman, MD;  Location: [facility] ORS;  Service: Cardiovascular;  Laterality: Left;    SOCIAL HISTORY:   Social History  Substance Use Topics  . Smoking status: Never Smoker   . Smokeless tobacco: Never Used  . Alcohol Use: No    FAMILY HISTORY:  Hypertension and heart condition in her family  DRUG ALLERGIES:   Allergies  Allergen Reactions  . Coreg [Carvedilol]   . Erythromycin Base Nausea And Vomiting  . Penicillins Swelling    "swelling of throat" Has patient had a PCN reaction causing immediate rash, facial/tongue/throat swelling, SOB or lightheadedness with hypotension: Yes Has patient had a PCN reaction causing severe rash involving mucus membranes or skin necrosis: No Has patient had a PCN reaction that required hospitalization No Has patient had a PCN reaction occurring within the last 10 years: No If all of the above answers are "NO", then may proceed with Cephalosporin use.  . Vitamin D Analogs   . Cefuroxime Axetil Nausea Only and Rash  . Morphine And Related Rash    REVIEW OF SYSTEMS:  Review of systems unobtainable as the patient is pleasantly confused  MEDICATIONS AT HOME:   Prior to Admission medications   Medication Sig Start Date End Date Taking? Authorizing  Provider  aspirin 325 MG tablet Take 162.5 mg by mouth daily.   Yes Historical Provider, MD  benazepril-hydrochlorthiazide (LOTENSIN HCT) 20-25 MG per tablet Take 1 tablet by mouth daily.   Yes Historical Provider, MD  bimatoprost (LUMIGAN) 0.03 % ophthalmic solution Place 1 drop into the right eye at bedtime.   Yes Historical Provider, MD  doxycycline (VIBRAMYCIN) 100 MG capsule Take 100 mg by mouth 2 (two) times daily. X 7 days. 01/27/15 02/03/15 Yes Historical Provider, MD  furosemide (LASIX) 20 MG tablet Take 20 mg  by mouth daily as needed for edema.    Yes Historical Provider, MD  hydrALAZINE (APRESOLINE) 25 MG tablet Take 1 tablet (25 mg total) by mouth every 8 (eight) hours. 12/05/14  Yes Epifanio Lesches, MD  metoprolol succinate (TOPROL-XL) 25 MG 24 hr tablet Take 25 mg by mouth daily. (replaces carvedilol) 01/27/15 01/27/16 Yes Historical Provider, MD  nitroGLYCERIN (NITROSTAT) 0.4 MG SL tablet Place 0.4 mg under the tongue every 5 (five) minutes as needed for chest pain. After 3rd dose if no relief call 911   Yes Historical Provider, MD  omeprazole (PRILOSEC) 20 MG capsule Take 20 mg by mouth daily.   Yes Historical Provider, MD  oxybutynin (DITROPAN-XL) 5 MG 24 hr tablet Take 5 mg by mouth at bedtime.   Yes Historical Provider, MD  potassium chloride (KLOR-CON 10) 10 MEQ tablet Take 10 mEq by mouth daily as needed. Take one whenever taking one furosemide as needed   Yes Historical Provider, MD  vitamin B-12 (CYANOCOBALAMIN) 1000 MCG tablet Take 1,000 mcg by mouth daily.   Yes Historical Provider, MD  warfarin (COUMADIN) 2 MG tablet Take 2 mg by mouth every Wednesday. Note dose   Yes Historical Provider, MD  warfarin (COUMADIN) 3 MG tablet Take 3 mg by mouth See admin instructions. Take 1 tablet orally every day except on Wednesday take 2mg  tablet.   Yes Historical Provider, MD      VITAL SIGNS:  Blood pressure 180/63, pulse 59, temperature 97.8 F (36.6 C), temperature source Oral, resp. rate 16, height 5\' 6"  (1.676 m), weight 78.926 kg (174 lb), SpO2 96 %.  PHYSICAL EXAMINATION:  GENERAL:  [name]-year-old patient lying in the bed with no acute distress.  EYES: Pupils equal, round, reactive to light and accommodation. No scleral icterus.   HEENT: Head atraumatic, normocephalic. Oropharynx and nasopharynx clear.  NECK:  Supple, no jugular venous distention. No thyroid enlargement, no tenderness.  LUNGS: Normal breath sounds bilaterally, no wheezing, rales,rhonchi or crepitation. No use of  accessory muscles of respiration.  CARDIOVASCULAR: Irregularly irregular No murmurs, rubs, or gallops.  ABDOMEN: Soft, nontender, nondistended. Bowel sounds present. No organomegaly or mass.  EXTREMITIES: No pedal edema, cyanosis, or clubbing.  NEUROLOGIC: Currently with altered mental status PSYCHIATRIC: The patient is alert but disoriented.  SKIN: No obvious rash, lesion, or ulcer.   LABORATORY PANEL:   CBC  Recent Labs Lab 01/28/15 1556  WBC 7.0  HGB 10.8*  HCT 32.3*  PLT 143*   ------------------------------------------------------------------------------------------------------------------  Chemistries   Recent Labs Lab 01/28/15 1556  NA 120*  K 3.5  CL 86*  CO2 25  GLUCOSE 105*  BUN 27*  CREATININE 0.91  CALCIUM 9.0  AST 31  ALT 19  ALKPHOS 93  BILITOT 0.6   ------------------------------------------------------------------------------------------------------------------  Cardiac Enzymes No results for input(s): TROPONINI in the last 168 hours. ------------------------------------------------------------------------------------------------------------------  RADIOLOGY:  No results found.  EKG:   Orders placed or performed during the hospital encounter  of 01/28/15  . EKG 12-Lead  . EKG 12-Lead    IMPRESSION AND PLAN:   Patient was seen by her primary care physician yesterday for 2 weeks history of pleasant confusion and was diagnosed with UTI yesterday was started on  Doxycycline. Patient's primary care physician Dr. Vickki Muff has called patient's daughter with abnormal labs today and has recommended to go to ED for low sodium   1. Altered mental status secondary to hyponatremia/uti  nothing by mouth with aspiration precautions as patient is pleasantly confused Discontinue hydrochlorothiazide Recent history of acute cystitis, will repeat urinalysis with culture and sensitivity Provide empiric antibiotics IV levofloxacin   2. Hyponatremia secondary  to hydrochlorothiazide and SIADH Discontinue hydrochlorothiazide which was started recently during previous admission in August Monitor BMP closely Will consider nephrology consult if no improvement Restrict by mouth fluids to 1800 mL Gentle hydration with IV fluids normal saline  3. Hypertension Discontinue hydrochlorothiazide and her Coreg was discontinued by her cardiologist Dr. Nehemiah Massed for allergic reaction Will resume her new home medication metoprolol and titrate as needed basis once patient is more awake and alert  4. Chronic history of congestive heart failure and sick sinus syndrome Currently patient is not fluid overloaded Patient is nothing by mouth, holding Lasix in view of altered mental status from hyponatremia Will consider resuming Lasix 1 patient is more awake and alert in a.m. Possibly Monitor intake and output, currently patient is under fluid restriction  5. Chronic history of atrial fibrillation Will check stat PT/INR and Coumadin management per pharmacy   All the records are reviewed and case discussed with ED provider. Management plans discussed with the patient, family and they are in agreement.  greater than 50% time was spent on coordination of care and face-to-face counseling  CODE STATUS: DO NOT RESUSCITATE, daughter is the healthcare power of attorney  TOTAL TIME TAKING CARE OF THIS PATIENT: 45 minutes.    Nicholes Mango M.D on 01/28/2015 at 7:26 PM  Between 7am to 6pm - Pager - [phone]  After 6pm go to [email or web address] - password [email or web address]  Office  [phone]  CC: Primary care physician; Chrisandra Carota, MD

## 2015-01-28 NOTE — ED Provider Notes (Signed)
[facility] Emergency Department Provider Note REMINDER - THIS NOTE IS NOT A FINAL MEDICAL RECORD UNTIL IT IS SIGNED. UNTIL THEN, THE CONTENT BELOW MAY REFLECT INFORMATION FROM A DOCUMENTATION TEMPLATE, NOT THE ACTUAL PATIENT VISIT. ____________________________________________  Time seen: Approximately 5:12 PM  I have reviewed the triage vital signs and the nursing notes.   HISTORY  Chief Complaint Dizziness    HPI Joyce Vasquez is a 81 y.o. female who presents for concerns of low sodium at her doctor's office. Daughter reports along with the patient that they've noticed some slight confusion over the last 2 weeks, she saw her doctor yesterday and was diagnosed with a possible urinary tract infection placed on doxycycline. Today the doctor call and set her sodium was very low and their concern is his causing her symptoms of some weakness and confusion.  She denies any numbness weakness or tingling in a particular arm or leg. No facial droop. No headache. No chest pain or total breathing. Patient and her daughter do note the diagnosis increased swelling in her legs over the last week.  Patient reports [place] 4 ounces of water every hour throughout the day, the daughter reports that this she does not drink enough fluids.   Past Medical History  Diagnosis Date  . Hypertension   . Arthritis     osteoarthritis  . Hyperlipidemia   . Temporal giant cell arteritis (HCC)     left eye  . Atrial fibrillation ([place])   . GERD (gastroesophageal reflux disease)   . CHF (congestive heart failure) ([place])   . Glaucoma   . DJD (degenerative joint disease)   . TIA (transient ischemic attack)   . Bell's palsy   . Cancer [facility])     skin    Patient Active Problem List   Diagnosis Date Noted  . HTN (hypertension), malignant 12/04/2014  . Status post cardiac pacemaker procedure 11/29/2014    Past Surgical History  Procedure Laterality Date  . Carotid endarterectomy  Right   . Appendectomy    . Abdominal hysterectomy    . Cholecystectomy    . Cardiac catheterization    . Joint replacement Bilateral     Right and Left knee Replacement  . Breast surgery Left     Excisional Breast Biopsy  . Tonsillectomy    . Eye surgery Right     Cataract Extraction--right eye only  . Pacemaker insertion Left 11/29/2014    Procedure: INSERTION PACEMAKER;  Surgeon: Isaias Cowman, MD;  Location: [facility] ORS;  Service: Cardiovascular;  Laterality: Left;    Current Outpatient Rx  Name  Route  Sig  Dispense  Refill  . aspirin EC 81 MG tablet   Oral   Take 162 mg by mouth daily.         . benazepril-hydrochlorthiazide (LOTENSIN HCT) 20-25 MG per tablet   Oral   Take 1 tablet by mouth daily.         . bimatoprost (LUMIGAN) 0.03 % ophthalmic solution   Right Eye   Place 1 drop into the right eye at bedtime.         . Cyanocobalamin (VITAMIN B-12 PO)   Oral   Take 2 tablets by mouth 3 (three) times a week.         . cyclobenzaprine (FLEXERIL) 5 MG tablet   Oral   Take 5 mg by mouth 3 (three) times daily as needed for muscle spasms.         . diphenhydramine-acetaminophen (  TYLENOL PM) 25-500 MG TABS   Oral   Take 1 tablet by mouth at bedtime as needed.         . docusate sodium (COLACE) 100 MG capsule   Oral   Take 1 capsule (100 mg total) by mouth 2 (two) times daily.   10 capsule   0   . furosemide (LASIX) 20 MG tablet   Oral   Take 20 mg by mouth as needed.         . hydrALAZINE (APRESOLINE) 25 MG tablet   Oral   Take 1 tablet (25 mg total) by mouth every 8 (eight) hours.   60 tablet   0   . ibuprofen (ADVIL,MOTRIN) 200 MG tablet   Oral   Take 200 mg by mouth every 6 (six) hours as needed.         . nitroGLYCERIN (NITROSTAT) 0.4 MG SL tablet   Sublingual   Place 0.4 mg under the tongue every 5 (five) minutes as needed for chest pain. After 3rd dose if no relief call 911         . omeprazole (PRILOSEC) 20 MG capsule    Oral   Take 20 mg by mouth daily.         Marland Kitchen oxybutynin (DITROPAN-XL) 5 MG 24 hr tablet   Oral   Take 5 mg by mouth at bedtime.         . potassium chloride (KLOR-CON 10) 10 MEQ tablet   Oral   Take 10 mEq by mouth daily. Take one whenever taking one furosemide as needed         . warfarin (COUMADIN) 4 MG tablet   Oral   Take 1 tablet (4 mg total) by mouth daily.   20 tablet   0     Allergies Coreg; Erythromycin base; Penicillins; Vitamin d analogs; Cefuroxime axetil; and Morphine and related  History reviewed. No pertinent family history.  Social History Social History  Substance Use Topics  . Smoking status: Never Smoker   . Smokeless tobacco: Never Used  . Alcohol Use: No    Review of Systems Constitutional: No fever/chills Eyes: No visual changes. ENT: No sore throat. Cardiovascular: Denies chest pain. Respiratory: Denies shortness of breath. Gastrointestinal: No abdominal pain.  No nausea, no vomiting.  No diarrhea.  No constipation. Genitourinary: Negative for dysuria. Musculoskeletal: Negative for back pain. Skin: Negative for rash. Neurological: Negative for headaches, focal weakness or numbness. Does feel generally weak and fatigued. Some slight and occasional confusion from time to time especially with recalling medications.  10-point ROS otherwise negative.  ____________________________________________   PHYSICAL EXAM:  VITAL SIGNS: ED Triage Vitals  Enc Vitals Group     BP 01/28/15 1551 154/49 mmHg     Pulse Rate 01/28/15 1551 65     Resp 01/28/15 1551 18     Temp 01/28/15 1551 97.8 F (36.6 C)     Temp Source 01/28/15 1551 Oral     SpO2 01/28/15 1551 99 %     Weight 01/28/15 1551 174 lb (78.926 kg)     Height 01/28/15 1551 5\' 6"  (1.676 m)     Head Cir --      Peak Flow --      Pain Score --      Pain Loc --      Pain Edu? --      Excl. in [place]? --    Constitutional: Alert and oriented. Well appearing and in no acute distress.  Reports it is 2016, October. Eyes: Conjunctivae are normal. PERRL. EOMI. Head: Atraumatic. Very hard of hearing. Nose: No congestion/rhinnorhea. Mouth/Throat: Mucous membranes are moist.  Oropharynx non-erythematous. Neck: No stridor.   Cardiovascular: Normal rate, regular rhythm. Grossly normal heart sounds.  Good peripheral circulation. Respiratory: Normal respiratory effort.  No retractions. Lungs CTAB. Gastrointestinal: Soft and nontender. No distention. No abdominal bruits. No CVA tenderness. Musculoskeletal: No lower extremity tenderness but 2+ lower extremity edema bilateral.  No joint effusions. Neurologic:  Normal speech and language. No gross focal neurologic deficits are appreciated. Skin:  Skin is warm, dry and intact. No rash noted. Psychiatric: Mood and affect are normal. Speech and behavior are normal.  ____________________________________________   LABS (all labs ordered are listed, but only abnormal results are displayed)  Labs Reviewed  COMPREHENSIVE METABOLIC PANEL - Abnormal; Notable for the following:    Sodium 120 (*)    Chloride 86 (*)    Glucose, Bld 105 (*)    BUN 27 (*)    GFR calc non Af Amer 52 (*)    All other components within normal limits  CBC - Abnormal; Notable for the following:    RBC 3.73 (*)    Hemoglobin 10.8 (*)    HCT 32.3 (*)    RDW 14.7 (*)    Platelets 143 (*)    All other components within normal limits  SODIUM, URINE, RANDOM  OSMOLALITY, URINE   ____________________________________________  EKG  Reviewed and interpreted by me Ventricular paced rhythm at rate of 62 No acute ischemic changes, though the patient does have ventricular pacing present. QRS is wide ____________________________________________  RADIOLOGY No indication for CT head at this time, no acute neurologic deficits. Reassuring neurologic exam, and I suspect the low sodium does explain the patient's mild confusion and generalized  weakness.  ____________________________________________   PROCEDURES  Procedure(s) performed: None  Critical Care performed: No  ____________________________________________   INITIAL IMPRESSION / ASSESSMENT AND PLAN / ED COURSE  Pertinent labs & imaging results that were available during my care of the patient were reviewed by me and considered in my medical decision making (see chart for details).  Patient presents for slight confusion, generalized weakness. That possibly her tract infection but now with sodium which is acutely lowered to 120 more likely etiology.  Patient is currently on doxycycline. At this point, the patient does have evidence of peripheral edema and doesn't history of congestive heart failure. I suspect the patient may actually be hypervolemic hyponatremia stopped her fluids. I will send urine electrolytes. Discussed with the hospitalist will follow-up for ongoing care and treatment.  Admitting the patient due to acute hyponatremia with reported confusion and generalized weakness. The patient would be at risk for seizures and other negative outcomes showed her sodium levels continue to drop. ____________________________________________   FINAL CLINICAL IMPRESSION(S) / ED DIAGNOSES  Final diagnoses:  Other fatigue  Acute hyponatremia      Delman Kitten, MD 01/28/15 1717

## 2015-01-28 NOTE — Consult Note (Signed)
ANTICOAGULATION CONSULT NOTE - Initial Consult  Pharmacy Consult for warfarin Indication: atrial fibrillation  Allergies  Allergen Reactions  . Coreg [Carvedilol]   . Erythromycin Base Nausea And Vomiting  . Penicillins Swelling    "swelling of throat" Has patient had a PCN reaction causing immediate rash, facial/tongue/throat swelling, SOB or lightheadedness with hypotension: Yes Has patient had a PCN reaction causing severe rash involving mucus membranes or skin necrosis: No Has patient had a PCN reaction that required hospitalization No Has patient had a PCN reaction occurring within the last 10 years: No If all of the above answers are "NO", then may proceed with Cephalosporin use.  . Vitamin D Analogs   . Cefuroxime Axetil Nausea Only and Rash  . Morphine And Related Rash    Patient Measurements: Height: 5\' 6"  (167.6 cm) Weight: 174 lb (78.926 kg) IBW/kg (Calculated) : 59.3 Heparin Dosing Weight:   Vital Signs: Temp: 98.1 F (36.7 C) (10/08 1939) Temp Source: Oral (10/08 1939) BP: 193/73 mmHg (10/08 1939) Pulse Rate: 62 (10/08 1939)  Labs:  Recent Labs  01/28/15 1556  HGB 10.8*  HCT 32.3*  PLT 143*  LABPROT 19.0*  INR 1.57  CREATININE 0.91    Estimated Creatinine Clearance: 39.2 mL/min (by C-G formula based on Cr of 0.91).   Medical History: Past Medical History  Diagnosis Date  . Hypertension   . Arthritis     osteoarthritis  . Hyperlipidemia   . Temporal giant cell arteritis (HCC)     left eye  . Atrial fibrillation ([place])   . GERD (gastroesophageal reflux disease)   . CHF (congestive heart failure) ([place])   . Glaucoma   . DJD (degenerative joint disease)   . TIA (transient ischemic attack)   . Bell's palsy   . Cancer ([place])     skin    Medications:  Prescriptions prior to admission  Medication Sig Dispense Refill Last Dose  . aspirin 325 MG tablet Take 162.5 mg by mouth daily.   01/28/2015 at Unknown time  . benazepril-hydrochlorthiazide  (LOTENSIN HCT) 20-25 MG per tablet Take 1 tablet by mouth daily.   01/28/2015 at Unknown time  . bimatoprost (LUMIGAN) 0.03 % ophthalmic solution Place 1 drop into the right eye at bedtime.   01/27/2015 at Unknown time  . doxycycline (VIBRAMYCIN) 100 MG capsule Take 100 mg by mouth 2 (two) times daily. X 7 days.   01/28/2015 at Unknown time  . furosemide (LASIX) 20 MG tablet Take 20 mg by mouth daily as needed for edema.    01/26/2015  . hydrALAZINE (APRESOLINE) 25 MG tablet Take 1 tablet (25 mg total) by mouth every 8 (eight) hours. 60 tablet 0 01/28/2015 at Unknown time  . metoprolol succinate (TOPROL-XL) 25 MG 24 hr tablet Take 25 mg by mouth daily. (replaces carvedilol)   01/27/2015 at 2300  . nitroGLYCERIN (NITROSTAT) 0.4 MG SL tablet Place 0.4 mg under the tongue every 5 (five) minutes as needed for chest pain. After 3rd dose if no relief call 911   prn  . omeprazole (PRILOSEC) 20 MG capsule Take 20 mg by mouth daily.   01/28/2015 at Unknown time  . oxybutynin (DITROPAN-XL) 5 MG 24 hr tablet Take 5 mg by mouth at bedtime.   01/27/2015 at Unknown time  . potassium chloride (KLOR-CON 10) 10 MEQ tablet Take 10 mEq by mouth daily as needed. Take one whenever taking one furosemide as needed   01/26/2015  . vitamin B-12 (CYANOCOBALAMIN) 1000 MCG tablet Take 1,000  mcg by mouth daily.   01/28/2015 at Unknown time  . warfarin (COUMADIN) 2 MG tablet Take 2 mg by mouth every Wednesday. Note dose   01/25/2015  . warfarin (COUMADIN) 3 MG tablet Take 3 mg by mouth See admin instructions. Take 1 tablet orally every day except on Wednesday take 2mg  tablet.   01/27/2015 at Unknown time   Scheduled:    Assessment: Pt is a 81 year old female presenting with UTI/confusion. Pt has a PMH of afib and is on warfarin. Home dose is 3mg  qd except wed, 2mg  on wed. Goal of Therapy:  INR 2-3 Monitor platelets by anticoagulation protocol: Yes   Plan:  Pt INR is subtherapeutic on admission (1.57). Will give 4.5mg  tonight. Recheck  INR in the AM. Will continue to monitor daily while pt is on antibiotics.  Sebrina Kessner D Shamir Tuzzolino 01/28/2015,8:01 PM

## 2015-01-28 NOTE — ED Notes (Addendum)
States sent by MD for low NA in the 35s, daughter states she has noticed some mild confusion, also states she has not been urinating very often

## 2015-01-28 NOTE — Consult Note (Signed)
ANTIBIOTIC CONSULT NOTE - INITIAL  Pharmacy Consult for levofloxacin Indication: UTI  Allergies  Allergen Reactions  . Coreg [Carvedilol]   . Erythromycin Base Nausea And Vomiting  . Penicillins Swelling    "swelling of throat" Has patient had a PCN reaction causing immediate rash, facial/tongue/throat swelling, SOB or lightheadedness with hypotension: Yes Has patient had a PCN reaction causing severe rash involving mucus membranes or skin necrosis: No Has patient had a PCN reaction that required hospitalization No Has patient had a PCN reaction occurring within the last 10 years: No If all of the above answers are "NO", then may proceed with Cephalosporin use.  . Vitamin D Analogs   . Cefuroxime Axetil Nausea Only and Rash  . Morphine And Related Rash    Patient Measurements: Height: 5\' 6"  (167.6 cm) Weight: 174 lb (78.926 kg) IBW/kg (Calculated) : 59.3 Adjusted Body Weight:   Vital Signs: Temp: 98.1 F (36.7 C) (10/08 1939) Temp Source: Oral (10/08 1939) BP: 193/73 mmHg (10/08 1939) Pulse Rate: 62 (10/08 1939) Intake/Output from previous day:   Intake/Output from this shift:    Labs:  Recent Labs  01/28/15 1556  WBC 7.0  HGB 10.8*  PLT 143*  CREATININE 0.91   Estimated Creatinine Clearance: 39.2 mL/min (by C-G formula based on Cr of 0.91). No results for input(s): VANCOTROUGH, VANCOPEAK, VANCORANDOM, GENTTROUGH, GENTPEAK, GENTRANDOM, TOBRATROUGH, TOBRAPEAK, TOBRARND, AMIKACINPEAK, AMIKACINTROU, AMIKACIN in the last 72 hours.   Microbiology: No results found for this or any previous visit (from the past 720 hour(s)).  Medical History: Past Medical History  Diagnosis Date  . Hypertension   . Arthritis     osteoarthritis  . Hyperlipidemia   . Temporal giant cell arteritis (HCC)     left eye  . Atrial fibrillation ([place])   . GERD (gastroesophageal reflux disease)   . CHF (congestive heart failure) ([place])   . Glaucoma   . DJD (degenerative joint disease)    . TIA (transient ischemic attack)   . Bell's palsy   . Cancer ([place])     skin    Medications:  Scheduled:   Assessment: Pt is a 81 year old female who presents confused/ recent UTI diagnosis. Pharmacy consulted to dose levofloxacin  Goal of Therapy:  resolution of infection  Plan:  Measure antibiotic drug levels at steady state Follow up culture results levofloxacin 250mg  q 24 hours. pharmacy to continue to monitor renal function  Nickalos Petersen D Jaren Kearn 01/28/2015,7:55 PM

## 2015-01-29 LAB — URINALYSIS COMPLETE WITH MICROSCOPIC (ARMC ONLY)
BILIRUBIN URINE: NEGATIVE
GLUCOSE, UA: NEGATIVE mg/dL
Ketones, ur: NEGATIVE mg/dL
NITRITE: NEGATIVE
PH: 6 (ref 5.0–8.0)
Protein, ur: NEGATIVE mg/dL
SPECIFIC GRAVITY, URINE: 1.009 (ref 1.005–1.030)

## 2015-01-29 LAB — BASIC METABOLIC PANEL
ANION GAP: 10 (ref 5–15)
BUN: 22 mg/dL — ABNORMAL HIGH (ref 6–20)
CALCIUM: 8.7 mg/dL — AB (ref 8.9–10.3)
CO2: 24 mmol/L (ref 22–32)
Chloride: 88 mmol/L — ABNORMAL LOW (ref 101–111)
Creatinine, Ser: 0.78 mg/dL (ref 0.44–1.00)
GLUCOSE: 99 mg/dL (ref 65–99)
POTASSIUM: 3.2 mmol/L — AB (ref 3.5–5.1)
Sodium: 122 mmol/L — ABNORMAL LOW (ref 135–145)

## 2015-01-29 LAB — PROTIME-INR
INR: 1.73
Prothrombin Time: 20.4 seconds — ABNORMAL HIGH (ref 11.4–15.0)

## 2015-01-29 LAB — MAGNESIUM: Magnesium: 1.1 mg/dL — ABNORMAL LOW (ref 1.7–2.4)

## 2015-01-29 LAB — MRSA PCR SCREENING: MRSA by PCR: NEGATIVE

## 2015-01-29 MED ORDER — MAGNESIUM SULFATE 4 GM/100ML IV SOLN
4.0000 g | Freq: Once | INTRAVENOUS | Status: AC
  Administered 2015-01-29: 4 g via INTRAVENOUS
  Filled 2015-01-29: qty 100

## 2015-01-29 MED ORDER — IBUPROFEN 600 MG PO TABS
600.0000 mg | ORAL_TABLET | Freq: Once | ORAL | Status: AC
  Administered 2015-01-29: 600 mg via ORAL
  Filled 2015-01-29: qty 1

## 2015-01-29 MED ORDER — WARFARIN SODIUM 3 MG PO TABS
3.0000 mg | ORAL_TABLET | Freq: Every day | ORAL | Status: DC
  Administered 2015-01-29 – 2015-01-31 (×3): 3 mg via ORAL
  Filled 2015-01-29 (×3): qty 1

## 2015-01-29 MED ORDER — BENAZEPRIL HCL 20 MG PO TABS
20.0000 mg | ORAL_TABLET | Freq: Every day | ORAL | Status: DC
  Administered 2015-01-29 – 2015-02-02 (×5): 20 mg via ORAL
  Filled 2015-01-29 (×6): qty 1

## 2015-01-29 MED ORDER — LEVOFLOXACIN IN D5W 250 MG/50ML IV SOLN
250.0000 mg | INTRAVENOUS | Status: DC
  Administered 2015-01-29 – 2015-01-31 (×3): 250 mg via INTRAVENOUS
  Filled 2015-01-29 (×4): qty 50

## 2015-01-29 MED ORDER — POTASSIUM CHLORIDE CRYS ER 20 MEQ PO TBCR
40.0000 meq | EXTENDED_RELEASE_TABLET | Freq: Once | ORAL | Status: AC
  Administered 2015-01-29: 40 meq via ORAL
  Filled 2015-01-29 (×2): qty 2

## 2015-01-29 NOTE — Progress Notes (Signed)
Pt complaining of restless legs- requesting tylenol/ibuprofen.  Pt recently medicated with tylenol- MD, Dr. Lavetta Nielsen notified for ibuprofen.  MD placed order.  Will continue to monitor. Joyce Vasquez

## 2015-01-29 NOTE — Progress Notes (Signed)
[facility] [place] NAME: Joyce Vasquez    MR#:  [id number]  DATE OF BIRTH:  27-Oct-1916  SUBJECTIVE:  CHIEF COMPLAINT:   Chief Complaint  Patient presents with  . Dizziness   feels better but still has generalized weakness  REVIEW OF SYSTEMS:  CONSTITUTIONAL: No fever, generalized weakness.  EYES: No blurred or double vision.  EARS, NOSE, AND THROAT: No tinnitus or ear pain.  RESPIRATORY: No cough, shortness of breath, wheezing or hemoptysis.  CARDIOVASCULAR: No chest pain, orthopnea, edema.  GASTROINTESTINAL: No nausea, vomiting, diarrhea or abdominal pain.  GENITOURINARY: No dysuria, hematuria.  ENDOCRINE: No polyuria, nocturia,  HEMATOLOGY: No anemia, easy bruising or bleeding SKIN: No rash or lesion. MUSCULOSKELETAL: No joint pain or arthritis.   NEUROLOGIC: No tingling, numbness, weakness.  PSYCHIATRY: No anxiety or depression.   DRUG ALLERGIES:   Allergies  Allergen Reactions  . Coreg [Carvedilol]   . Erythromycin Base Nausea And Vomiting  . Penicillins Swelling    "swelling of throat" Has patient had a PCN reaction causing immediate rash, facial/tongue/throat swelling, SOB or lightheadedness with hypotension: Yes Has patient had a PCN reaction causing severe rash involving mucus membranes or skin necrosis: No Has patient had a PCN reaction that required hospitalization No Has patient had a PCN reaction occurring within the last 10 years: No If all of the above answers are "NO", then may proceed with Cephalosporin use.  . Vitamin D Analogs   . Cefuroxime Axetil Nausea Only and Rash  . Morphine And Related Rash    VITALS:  Blood pressure 151/56, pulse 60, temperature 97.5 F (36.4 C), temperature source Oral, resp. rate 18, height 5\' 6"  (1.676 m), weight 78.926 kg (174 lb), SpO2 100 %.  PHYSICAL EXAMINATION:  GENERAL:  81 y.o.-year-old patient lying in the bed with no acute distress.  EYES: Pupils equal, round,  reactive to light and accommodation. No scleral icterus. Extraocular muscles intact.  HEENT: Head atraumatic, normocephalic. Oropharynx and nasopharynx clear. Moist oral mucosa. NECK:  Supple, no jugular venous distention. No thyroid enlargement, no tenderness.  LUNGS: Normal breath sounds bilaterally, no wheezing, rales,rhonchi or crepitation. No use of accessory muscles of respiration.  CARDIOVASCULAR: S1, S2 normal. No murmurs, rubs, or gallops.  ABDOMEN: Soft, nontender, nondistended. Bowel sounds present. No organomegaly or mass.  EXTREMITIES: No pedal edema, cyanosis, or clubbing.  NEUROLOGIC: Cranial nerves II through XII are intact. Muscle strength 4/5 in all extremities. Sensation intact. Gait not checked.  PSYCHIATRIC: The patient is alert and oriented x 3.  SKIN: No obvious rash, lesion, or ulcer.    LABORATORY PANEL:   CBC  Recent Labs Lab 01/28/15 1556  WBC 7.0  HGB 10.8*  HCT 32.3*  PLT 143*   ------------------------------------------------------------------------------------------------------------------  Chemistries   Recent Labs Lab 01/28/15 1556 01/29/15 0446  NA 120* 122*  K 3.5 3.2*  CL 86* 88*  CO2 25 24  GLUCOSE 105* 99  BUN 27* 22*  CREATININE 0.91 0.78  CALCIUM 9.0 8.7*  MG  --  1.1*  AST 31  --   ALT 19  --   ALKPHOS 93  --   BILITOT 0.6  --    ------------------------------------------------------------------------------------------------------------------  Cardiac Enzymes No results for input(s): TROPONINI in the last 168 hours. ------------------------------------------------------------------------------------------------------------------  RADIOLOGY:  No results found.  EKG:   Orders placed or performed during the hospital encounter of 01/28/15  . EKG 12-Lead  . EKG 12-Lead    ASSESSMENT AND PLAN:  1. Altered mental status secondary to hyponatremia/UTI. Mental status improved. Discontinue hydrochlorothiazide. Recent  history of acute cystitis, repeated urinalysis showed UTI, continue IV levofloxacin and a follow-up urine culture.    2. 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 Sodium level is still low. Discontinued hydrochlorothiazide which was started recently during previous admission in August. Restrict by mouth fluids to 1800 mL Continue IV fluids normal saline and follow-up BMP.  3. Hypertension. Continue metoprolol, resume benazepril.  4. History of chronic congestive heart failure and sick sinus syndrome Stable. hold Lasix due to hyponatremia  5. Chronic history of atrial fibrillation Continue Lopressor and Coumadin management per pharmacy, follow-up INR.  *Hypomagnesemia. IV magnesium and a follow-up level. * Hypokalemia. Give potassium supplement and follow-up BMP.  PT evaluation for weakness.  All the records are reviewed and case discussed with Care Management/Social Workerr. Management plans discussed with the patient, family and they are in agreement.  CODE STATUS: DO NOT RESUSCITATE  TOTAL TIME TAKING CARE OF THIS PATIENT: 38 minutes.   POSSIBLE D/C IN 3 DAYS, DEPENDING ON CLINICAL CONDITION.   Demetrios Loll M.D on 01/29/2015 at 1:55 PM  Between 7am to 6pm - Pager - [phone]  After 6pm go to [email or web address] - password [email or web address] [facility]  Office  [phone]  CC: Primary care physician; Chrisandra Carota, MD

## 2015-01-29 NOTE — Evaluation (Signed)
Physical Therapy Evaluation Patient Details Name: Joyce Vasquez MRN: [id number] DOB: 1917/03/26 Today's Date: 01/29/2015   History of Present Illness  PT here with hyponatremia, likely from med taking issues.  Has had AMS for 2 weeks and is minimally confused during eval.  Clinical Impression  Pt was able to get to sitting, get to standing and walk 75 ft w/o needing direct assist.  She has no LOBs or safety concerns though daughter reports she is walking minimally slower than her baseline she does not need further PT intervention.  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     Follow Up Recommendations No PT follow up    Equipment Recommendations       Recommendations for Other Services       Precautions / Restrictions Precautions Precautions: Fall Restrictions Weight Bearing Restrictions: No      Mobility  Bed Mobility Overal bed mobility: Independent                Transfers Overall transfer level: Independent Equipment used: Scientist, clinical (histocompatibility and immunogenetics) transfer comment: Pt needs reminders to lock rollator and for hand placement getting up and down  Ambulation/Gait Ambulation/Gait assistance: Supervision Ambulation Distance (Feet): 75 Feet Assistive device: 4-wheeled walker       General Gait Details: Pt ambulates with good confidence and she had no LOBs, no significant fatigue and generally did very well.  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     Hand Dominance        Extremity/Trunk Assessment   Upper Extremity Assessment: Overall WFL for tasks assessed (did not test L shoulder elevation 2/2 recent pacemaker)           Lower Extremity Assessment: Overall WFL for tasks assessed         Communication   Communication: HOH  Cognition Arousal/Alertness: Awake/alert Behavior During Therapy: WFL for tasks assessed/performed Overall Cognitive Status: Within Functional Limits for tasks assessed                      General Comments      Exercises        Assessment/Plan    PT Assessment Patent does not need any further PT services  PT Diagnosis Difficulty walking;Generalized weakness   PT Problem List    PT Treatment Interventions     PT Goals (Current goals can be found in the Care Plan section) Acute Rehab PT Goals Patient Stated Goal: "I need to get back to [facility]"    Frequency  Barriers to discharge        Co-evaluation               End of Session Equipment Utilized During Treatment: Gait belt Activity Tolerance: Patient tolerated treatment well Patient left: with chair alarm set           Time: 9597-4718 PT Time Calculation (min) (ACUTE ONLY): 22 min   Charges:   PT Evaluation $Initial PT Evaluation Tier I: 1 Procedure     PT G Codes:       Joyce Vasquez, PT, DPT [phone]  Joyce Vasquez 01/29/2015, 5:40 PM

## 2015-01-29 NOTE — Consult Note (Signed)
ANTICOAGULATION CONSULT NOTE - Initial Consult  Pharmacy Consult for warfarin Indication: atrial fibrillation  Allergies  Allergen Reactions  . Coreg [Carvedilol]   . Erythromycin Base Nausea And Vomiting  . Penicillins Swelling    "swelling of throat" Has patient had a PCN reaction causing immediate rash, facial/tongue/throat swelling, SOB or lightheadedness with hypotension: Yes Has patient had a PCN reaction causing severe rash involving mucus membranes or skin necrosis: No Has patient had a PCN reaction that required hospitalization No Has patient had a PCN reaction occurring within the last 10 years: No If all of the above answers are "NO", then may proceed with Cephalosporin use.  . Vitamin D Analogs   . Cefuroxime Axetil Nausea Only and Rash  . Morphine And Related Rash    Patient Measurements: Height: 5\' 6"  (167.6 cm) Weight: 174 lb (78.926 kg) IBW/kg (Calculated) : 59.3 Heparin Dosing Weight:   Vital Signs: Temp: 97.7 F (36.5 C) (10/09 0432) Temp Source: Oral (10/09 0432) BP: 155/56 mmHg (10/09 0432) Pulse Rate: 65 (10/09 0432)  Labs:  Recent Labs  01/28/15 1556 01/29/15 0447  HGB 10.8*  --   HCT 32.3*  --   PLT 143*  --   LABPROT 19.0* 20.4*  INR 1.57 1.73  CREATININE 0.91  --     Estimated Creatinine Clearance: 39.2 mL/min (by C-G formula based on Cr of 0.91).   Medical History: Past Medical History  Diagnosis Date  . Hypertension   . Arthritis     osteoarthritis  . Hyperlipidemia   . Temporal giant cell arteritis (HCC)     left eye  . Atrial fibrillation ([place])   . GERD (gastroesophageal reflux disease)   . CHF (congestive heart failure) ([place])   . Glaucoma   . DJD (degenerative joint disease)   . TIA (transient ischemic attack)   . Bell's palsy   . Cancer ([place])     skin    Medications:  Prescriptions prior to admission  Medication Sig Dispense Refill Last Dose  . aspirin 325 MG tablet Take 162.5 mg by mouth daily.   01/28/2015 at  Unknown time  . benazepril-hydrochlorthiazide (LOTENSIN HCT) 20-25 MG per tablet Take 1 tablet by mouth daily.   01/28/2015 at Unknown time  . bimatoprost (LUMIGAN) 0.03 % ophthalmic solution Place 1 drop into the right eye at bedtime.   01/27/2015 at Unknown time  . doxycycline (VIBRAMYCIN) 100 MG capsule Take 100 mg by mouth 2 (two) times daily. X 7 days.   01/28/2015 at Unknown time  . furosemide (LASIX) 20 MG tablet Take 20 mg by mouth daily as needed for edema.    01/26/2015  . hydrALAZINE (APRESOLINE) 25 MG tablet Take 1 tablet (25 mg total) by mouth every 8 (eight) hours. 60 tablet 0 01/28/2015 at Unknown time  . metoprolol succinate (TOPROL-XL) 25 MG 24 hr tablet Take 25 mg by mouth daily. (replaces carvedilol)   01/27/2015 at 2300  . nitroGLYCERIN (NITROSTAT) 0.4 MG SL tablet Place 0.4 mg under the tongue every 5 (five) minutes as needed for chest pain. After 3rd dose if no relief call 911   prn  . omeprazole (PRILOSEC) 20 MG capsule Take 20 mg by mouth daily.   01/28/2015 at Unknown time  . oxybutynin (DITROPAN-XL) 5 MG 24 hr tablet Take 5 mg by mouth at bedtime.   01/27/2015 at Unknown time  . potassium chloride (KLOR-CON 10) 10 MEQ tablet Take 10 mEq by mouth daily as needed. Take one whenever taking one  furosemide as needed   01/26/2015  . vitamin B-12 (CYANOCOBALAMIN) 1000 MCG tablet Take 1,000 mcg by mouth daily.   01/28/2015 at Unknown time  . warfarin (COUMADIN) 2 MG tablet Take 2 mg by mouth every Wednesday. Note dose   01/25/2015  . warfarin (COUMADIN) 3 MG tablet Take 3 mg by mouth See admin instructions. Take 1 tablet orally every day except on Wednesday take 2mg  tablet.   01/27/2015 at Unknown time   Scheduled:  . aspirin  162.5 mg Oral Daily  . docusate sodium  100 mg Oral Daily  . doxycycline  100 mg Oral BID  . hydrALAZINE  25 mg Oral 3 times per day  . latanoprost  1 drop Both Eyes QHS  . metoprolol succinate  25 mg Oral Daily  . oxybutynin  5 mg Oral QHS  . pantoprazole  40 mg  Oral Daily  . vitamin B-12  1,000 mcg Oral Daily  . warfarin  3 mg Oral q1800  . Warfarin - Pharmacist Dosing Inpatient   Does not apply q1800    Assessment: Pt is a 81 year old female presenting with UTI/confusion. Pt has a PMH of afib and is on warfarin. Home dose is 3mg  qd except wed, 2mg  on wed. Goal of Therapy:  INR 2-3 Monitor platelets by anticoagulation protocol: Yes   Plan:  Pt INR is subtherapeutic on admission (1.57). Will give 4.5mg  tonight. Recheck INR in the AM. Will continue to monitor daily while pt is on antibiotics.  10/9 AM INR 1.73. Home dose of 3 mg daily ordered. Recheck INR in AM.  Atalia Litzinger S 01/29/2015,5:23 AM

## 2015-01-30 LAB — BASIC METABOLIC PANEL
Anion gap: 8 (ref 5–15)
BUN: 17 mg/dL (ref 6–20)
CHLORIDE: 90 mmol/L — AB (ref 101–111)
CO2: 23 mmol/L (ref 22–32)
CREATININE: 0.72 mg/dL (ref 0.44–1.00)
Calcium: 8.8 mg/dL — ABNORMAL LOW (ref 8.9–10.3)
GFR calc non Af Amer: >60 mL/min (ref >60)
Glucose, Bld: 114 mg/dL — ABNORMAL HIGH (ref 65–99)
POTASSIUM: 3.6 mmol/L (ref 3.5–5.1)
SODIUM: 121 mmol/L — AB (ref 135–145)

## 2015-01-30 LAB — MAGNESIUM: MAGNESIUM: 1.6 mg/dL — AB (ref 1.7–2.4)

## 2015-01-30 LAB — PROTIME-INR
INR: 1.86
Prothrombin Time: 21.6 seconds — ABNORMAL HIGH (ref 11.4–15.0)

## 2015-01-30 MED ORDER — SODIUM CHLORIDE 1 G PO TABS
1.0000 g | ORAL_TABLET | Freq: Three times a day (TID) | ORAL | Status: DC
Start: 2015-01-30 — End: 2015-02-04
  Administered 2015-01-30 – 2015-02-04 (×17): 1 g via ORAL
  Filled 2015-01-30 (×18): qty 1

## 2015-01-30 MED ORDER — MAGNESIUM SULFATE 2 GM/50ML IV SOLN
2.0000 g | Freq: Once | INTRAVENOUS | Status: AC
  Administered 2015-01-30: 2 g via INTRAVENOUS
  Filled 2015-01-30: qty 50

## 2015-01-30 NOTE — Consult Note (Signed)
ANTICOAGULATION CONSULT NOTE - Initial Consult  Pharmacy Consult for warfarin Indication: atrial fibrillation  Allergies  Allergen Reactions  . Coreg [Carvedilol]   . Erythromycin Base Nausea And Vomiting  . Penicillins Swelling    "swelling of throat" Has patient had a PCN reaction causing immediate rash, facial/tongue/throat swelling, SOB or lightheadedness with hypotension: Yes Has patient had a PCN reaction causing severe rash involving mucus membranes or skin necrosis: No Has patient had a PCN reaction that required hospitalization No Has patient had a PCN reaction occurring within the last 10 years: No If all of the above answers are "NO", then may proceed with Cephalosporin use.  . Vitamin D Analogs   . Cefuroxime Axetil Nausea Only and Rash  . Morphine And Related Rash    Patient Measurements: Height: 5\' 6"  (167.6 cm) Weight: 174 lb (78.926 kg) IBW/kg (Calculated) : 59.3 Heparin Dosing Weight:   Vital Signs: Temp: 97.5 F (36.4 C) (10/10 0431) Temp Source: Oral (10/10 0431) BP: 171/67 mmHg (10/10 0431) Pulse Rate: 60 (10/10 0431)  Labs:  Recent Labs  01/28/15 1556 01/29/15 0446 01/29/15 0447 01/30/15 0403  HGB 10.8*  --   --   --   HCT 32.3*  --   --   --   PLT 143*  --   --   --   LABPROT 19.0*  --  20.4* 21.6*  INR 1.57  --  1.73 1.86  CREATININE 0.91 0.78  --  0.72    Estimated Creatinine Clearance: 44.6 mL/min (by C-G formula based on Cr of 0.72).   Medical History: Past Medical History  Diagnosis Date  . Hypertension   . Arthritis     osteoarthritis  . Hyperlipidemia   . Temporal giant cell arteritis (HCC)     left eye  . Atrial fibrillation ([place])   . GERD (gastroesophageal reflux disease)   . CHF (congestive heart failure) ([place])   . Glaucoma   . DJD (degenerative joint disease)   . TIA (transient ischemic attack)   . Bell's palsy   . Cancer ([place])     skin    Medications:  Prescriptions prior to admission  Medication Sig Dispense  Refill Last Dose  . aspirin 325 MG tablet Take 162.5 mg by mouth daily.   01/28/2015 at Unknown time  . benazepril-hydrochlorthiazide (LOTENSIN HCT) 20-25 MG per tablet Take 1 tablet by mouth daily.   01/28/2015 at Unknown time  . bimatoprost (LUMIGAN) 0.03 % ophthalmic solution Place 1 drop into the right eye at bedtime.   01/27/2015 at Unknown time  . doxycycline (VIBRAMYCIN) 100 MG capsule Take 100 mg by mouth 2 (two) times daily. X 7 days.   01/28/2015 at Unknown time  . furosemide (LASIX) 20 MG tablet Take 20 mg by mouth daily as needed for edema.    01/26/2015  . hydrALAZINE (APRESOLINE) 25 MG tablet Take 1 tablet (25 mg total) by mouth every 8 (eight) hours. 60 tablet 0 01/28/2015 at Unknown time  . metoprolol succinate (TOPROL-XL) 25 MG 24 hr tablet Take 25 mg by mouth daily. (replaces carvedilol)   01/27/2015 at 2300  . nitroGLYCERIN (NITROSTAT) 0.4 MG SL tablet Place 0.4 mg under the tongue every 5 (five) minutes as needed for chest pain. After 3rd dose if no relief call 911   prn  . omeprazole (PRILOSEC) 20 MG capsule Take 20 mg by mouth daily.   01/28/2015 at Unknown time  . oxybutynin (DITROPAN-XL) 5 MG 24 hr tablet Take 5 mg  by mouth at bedtime.   01/27/2015 at Unknown time  . potassium chloride (KLOR-CON 10) 10 MEQ tablet Take 10 mEq by mouth daily as needed. Take one whenever taking one furosemide as needed   01/26/2015  . vitamin B-12 (CYANOCOBALAMIN) 1000 MCG tablet Take 1,000 mcg by mouth daily.   01/28/2015 at Unknown time  . warfarin (COUMADIN) 2 MG tablet Take 2 mg by mouth every Wednesday. Note dose   01/25/2015  . warfarin (COUMADIN) 3 MG tablet Take 3 mg by mouth See admin instructions. Take 1 tablet orally every day except on Wednesday take 2mg  tablet.   01/27/2015 at Unknown time   Scheduled:  . aspirin  162.5 mg Oral Daily  . benazepril  20 mg Oral Daily  . docusate sodium  100 mg Oral Daily  . doxycycline  100 mg Oral BID  . hydrALAZINE  25 mg Oral 3 times per day  . latanoprost   1 drop Both Eyes QHS  . levofloxacin (LEVAQUIN) IV  250 mg Intravenous Q24H  . metoprolol succinate  25 mg Oral Daily  . oxybutynin  5 mg Oral QHS  . pantoprazole  40 mg Oral Daily  . vitamin B-12  1,000 mcg Oral Daily  . warfarin  3 mg Oral q1800  . Warfarin - Pharmacist Dosing Inpatient   Does not apply q1800    Assessment: Pt is a 81 year old female presenting with UTI/confusion. Pt has a PMH of afib and is on warfarin. Home dose is 3mg  qd except wed, 2mg  on wed. Goal of Therapy:  INR 2-3 Monitor platelets by anticoagulation protocol: Yes   Plan:  Pt INR is subtherapeutic on admission (1.57). Will give 4.5mg  tonight. Recheck INR in the AM. Will continue to monitor daily while pt is on antibiotics.  10/9 AM INR 1.73. Home dose of 3 mg daily ordered. Recheck INR in AM.  10/10 INR 1.86. Continue current regimen. INR in AM.  Kamylah Manzo S 01/30/2015,5:41 AM

## 2015-01-30 NOTE — Care Management Note (Signed)
Case Management Note  Patient Details  Name: Joyce Vasquez MRN: [id number] Date of Birth: Nov 09, 1916  Subjective/Objective:      81yo Joyce Vasquez was admitted 01/28/15 with confusion. Dx: UTI. Previous Dx include Pacemaker, Bells Palsy, CHF, A-Fib, HTN. Joyce Vasquez is a resident of [place]. Her daughter Joyce Vasquez is very involved with Joyce Vasquez's care. Pharmacy= [facility]. PCP=Dr Norman Clay. Assistive equipment includes a rolling walker, cane, and shower chair. If Joyce Swords requires home health after this hospital discharge, her daughter chose [facility]; [phone] which is located at [facility]. Case management will follow for discharge planning.                Action/Plan:   Expected Discharge Date:                  Expected Discharge Plan:     In-House Referral:     Discharge planning Services     Post Acute Care Choice:    Choice offered to:     DME Arranged:    DME Agency:     HH Arranged:    [place] Agency:     Status of Service:     Medicare Important Message Given:    Date Medicare IM Given:    Medicare IM give by:    Date Additional Medicare IM Given:    Additional Medicare Important Message give by:     If discussed at [place] of Stay Meetings, dates discussed:    Additional Comments:  Caeli Linehan A, RN 01/30/2015, 12:12 PM

## 2015-01-30 NOTE — Progress Notes (Signed)
[place] at [place] NAME: Claribel Sachs    MR#:  [id number]  DATE OF BIRTH:  11-27-1916  SUBJECTIVE:  CHIEF COMPLAINT:   Chief Complaint  Patient presents with  . Dizziness  better generalized weakness, hallucination last night per her daughter.  REVIEW OF SYSTEMS:  CONSTITUTIONAL: No fever, generalized weakness.  EYES: No blurred or double vision.  EARS, NOSE, AND THROAT: No tinnitus or ear pain.  RESPIRATORY: No cough, shortness of breath, wheezing or hemoptysis.  CARDIOVASCULAR: No chest pain, orthopnea, edema.  GASTROINTESTINAL: No nausea, vomiting, diarrhea or abdominal pain.  GENITOURINARY: No dysuria, hematuria.  ENDOCRINE: No polyuria, nocturia,  HEMATOLOGY: No anemia, easy bruising or bleeding SKIN: No rash or lesion. MUSCULOSKELETAL: No joint pain or arthritis.   NEUROLOGIC: No tingling, numbness, weakness.  PSYCHIATRY: No anxiety or depression.   DRUG ALLERGIES:   Allergies  Allergen Reactions  . Coreg [Carvedilol]   . Erythromycin Base Nausea And Vomiting  . Penicillins Swelling    "swelling of throat" Has patient had a PCN reaction causing immediate rash, facial/tongue/throat swelling, SOB or lightheadedness with hypotension: Yes Has patient had a PCN reaction causing severe rash involving mucus membranes or skin necrosis: No Has patient had a PCN reaction that required hospitalization No Has patient had a PCN reaction occurring within the last 10 years: No If all of the above answers are "NO", then may proceed with Cephalosporin use.  . Vitamin D Analogs   . Cefuroxime Axetil Nausea Only and Rash  . Morphine And Related Rash    VITALS:  Blood pressure 162/59, pulse 60, temperature 97.5 F (36.4 C), temperature source Oral, resp. rate 18, height 5\' 6"  (1.676 m), weight 78.926 kg (174 lb), SpO2 99 %.  PHYSICAL EXAMINATION:  GENERAL:  81 y.o.-year-old patient lying in the bed with no acute distress.  EYES:  Pupils equal, round, reactive to light and accommodation. No scleral icterus. Extraocular muscles intact.  HEENT: Head atraumatic, normocephalic. Oropharynx and nasopharynx clear. Moist oral mucosa. NECK:  Supple, no jugular venous distention. No thyroid enlargement, no tenderness.  LUNGS: Normal breath sounds bilaterally, no wheezing, mild rales. No use of accessory muscles of respiration.  CARDIOVASCULAR: S1, S2 normal. No murmurs, rubs, or gallops.  ABDOMEN: Soft, nontender, nondistended. Bowel sounds present. No organomegaly or mass.  EXTREMITIES: No pedal edema, cyanosis, or clubbing.  NEUROLOGIC: Cranial nerves II through XII are intact. Muscle strength 4/5 in all extremities. Sensation intact. Gait not checked.  PSYCHIATRIC: The patient is alert and oriented x 3.  SKIN: No obvious rash, lesion, or ulcer.    LABORATORY PANEL:   CBC  Recent Labs Lab 01/28/15 1556  WBC 7.0  HGB 10.8*  HCT 32.3*  PLT 143*   ------------------------------------------------------------------------------------------------------------------  Chemistries   Recent Labs Lab 01/28/15 1556  01/30/15 0403  NA 120*  < > 121*  K 3.5  < > 3.6  CL 86*  < > 90*  CO2 25  < > 23  GLUCOSE 105*  < > 114*  BUN 27*  < > 17  CREATININE 0.91  < > 0.72  CALCIUM 9.0  < > 8.8*  MG  --   < > 1.6*  AST 31  --   --   ALT 19  --   --   ALKPHOS 93  --   --   BILITOT 0.6  --   --   < > = values in this interval not displayed. ------------------------------------------------------------------------------------------------------------------  Cardiac Enzymes No results for input(s): TROPONINI in the last 168 hours. ------------------------------------------------------------------------------------------------------------------  RADIOLOGY:  No results found.  EKG:   Orders placed or performed during the hospital encounter of 01/28/15  . EKG 12-Lead  . EKG 12-Lead    ASSESSMENT AND PLAN:   1. Altered  mental status secondary to hyponatremia/UTI. Mental status improved. Discontinued hydrochlorothiazide. Recent history of acute cystitis, repeated urinalysis showed UTI, continue IV levofloxacin and  urine culture shows GNR.    2. 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 Sodium level is still low. Discontinued hydrochlorothiazide which was started recently during previous admission in August. Restrict by mouth fluids to 1800 mL discontinue IV fluids normal saline, start salt tablet and follow-up BMP.  3. Hypertension. Continue metoprolol, resume benazepril.  4. History of chronic congestive heart failure and sick sinus syndrome Stable. hold Lasix due to hyponatremia  5. Chronic history of atrial fibrillation Continue Lopressor and Coumadin management per pharmacy, follow-up INR.  *Hypomagnesemia. Better, One more dose IV magnesium and follow-up level. * Hypokalemia. Given potassium supplement and improved.  PT evaluation suggested no PT follow-up.  All the records are reviewed and case discussed with Care Management/Social Workerr. Management plans discussed with the patient, her daughter and they are in agreement.  CODE STATUS: DO NOT RESUSCITATE  TOTAL TIME TAKING CARE OF THIS PATIENT: 38 minutes.   POSSIBLE D/C IN 3 DAYS, DEPENDING ON CLINICAL CONDITION.   Demetrios Loll M.D on 01/30/2015 at 1:19 PM  Between 7am to 6pm - Pager - [phone]  After 6pm go to [email or web address] - password [email or web address] [facility]  Office  [phone]  CC: Primary care physician; Chrisandra Carota, MD

## 2015-01-31 LAB — BASIC METABOLIC PANEL
Anion gap: 9 (ref 5–15)
BUN: 13 mg/dL (ref 6–20)
CHLORIDE: 87 mmol/L — AB (ref 101–111)
CO2: 24 mmol/L (ref 22–32)
CREATININE: 0.66 mg/dL (ref 0.44–1.00)
Calcium: 8.7 mg/dL — ABNORMAL LOW (ref 8.9–10.3)
GFR calc Af Amer: >60 mL/min (ref >60)
GFR calc non Af Amer: >60 mL/min (ref >60)
Glucose, Bld: 110 mg/dL — ABNORMAL HIGH (ref 65–99)
Potassium: 3.4 mmol/L — ABNORMAL LOW (ref 3.5–5.1)
SODIUM: 120 mmol/L — AB (ref 135–145)

## 2015-01-31 LAB — MAGNESIUM: Magnesium: 1.7 mg/dL (ref 1.7–2.4)

## 2015-01-31 LAB — URINE CULTURE: Culture: >=100000

## 2015-01-31 LAB — URIC ACID: URIC ACID, SERUM: 4.2 mg/dL (ref 2.3–6.6)

## 2015-01-31 LAB — PROTIME-INR
INR: 1.73
PROTHROMBIN TIME: 20.4 s — AB (ref 11.4–15.0)

## 2015-01-31 LAB — OSMOLALITY, URINE: OSMOLALITY UR: 344 mosm/kg (ref 300–900)

## 2015-01-31 LAB — CORTISOL: Cortisol, Plasma: 15 ug/dL

## 2015-01-31 LAB — SODIUM, URINE, RANDOM: Sodium, Ur: 51 mmol/L

## 2015-01-31 LAB — TSH: TSH: 4.4 u[IU]/mL (ref 0.350–4.500)

## 2015-01-31 LAB — OSMOLALITY: OSMOLALITY: 238 mosm/kg — AB (ref 275–295)

## 2015-01-31 LAB — SODIUM
SODIUM: 114 mmol/L — AB (ref 135–145)
Sodium: 113 mmol/L — CL (ref 135–145)

## 2015-01-31 MED ORDER — SODIUM CHLORIDE 3 % IV SOLN
INTRAVENOUS | Status: DC
  Administered 2015-01-31: 25 mL/h via INTRAVENOUS
  Administered 2015-02-01: 40 mL/h via INTRAVENOUS
  Filled 2015-01-31 (×7): qty 500

## 2015-01-31 MED ORDER — SODIUM CHLORIDE 0.9 % IV SOLN
500.0000 mg | Freq: Two times a day (BID) | INTRAVENOUS | Status: DC
  Filled 2015-01-31 (×2): qty 0.5

## 2015-01-31 MED ORDER — SODIUM CHLORIDE 0.9 % IV SOLN
500.0000 mg | Freq: Two times a day (BID) | INTRAVENOUS | Status: DC
  Administered 2015-01-31 – 2015-02-02 (×4): 500 mg via INTRAVENOUS
  Filled 2015-01-31 (×5): qty 0.5

## 2015-01-31 MED ORDER — PROMETHAZINE HCL 25 MG/ML IJ SOLN
12.5000 mg | Freq: Four times a day (QID) | INTRAMUSCULAR | Status: DC | PRN
  Filled 2015-01-31: qty 1

## 2015-01-31 MED ORDER — SODIUM CHLORIDE 0.9 % IV SOLN
INTRAVENOUS | Status: DC
  Administered 2015-01-31: 17:00:00 via INTRAVENOUS

## 2015-01-31 NOTE — Consult Note (Signed)
[facility] CONSULT NOTE    Date: 01/31/2015                  Patient Name:  Joyce Vasquez  MRN: [id number]  DOB: May 27, 1916  Age / Sex: 81 y.o., female         PCP: Chrisandra Carota, MD                 Service Requesting Consult: Dr. Bridgett Larsson                 Reason for Consult: hyponatremia            History of Present Illness: Patient is a 81 y.o. female with a PMHx of hypertension, osteoarthritis, hyperlipidemia, temporal arteritis, atrial fibrillation, GERD, congestive heart failure, glaucoma, history of TIA, Bell's palsy, who was admitted to [facility] on 01/28/2015 for evaluation of altered mental status.  patient remains confused at the time of interview therefore history is obtained through chart review. Apparently the patient has been confused over the past 2 weeks. She was recently diagnosed with a urinary tract infection as an outpatient and started on doxycycline. She also saw her primary care physician and it was noted that the patient had hyponatremia. Her baseline sodium is in the low 130s. Patient was apparently started on benazepril/hydrochlorothiazide in August. The patient denied nausea, vomiting, or diarrhea though unclear as to whether this is accurate. Patient did receive a small amount of IV fluid hydration with normal saline however her sodium remains low.    Medications: Outpatient medications: Prescriptions prior to admission  Medication Sig Dispense Refill Last Dose  . aspirin 325 MG tablet Take 162.5 mg by mouth daily.   01/28/2015 at Unknown time  . benazepril-hydrochlorthiazide (LOTENSIN HCT) 20-25 MG per tablet Take 1 tablet by mouth daily.   01/28/2015 at Unknown time  . bimatoprost (LUMIGAN) 0.03 % ophthalmic solution Place 1 drop into the right eye at bedtime.   01/27/2015 at Unknown time  . doxycycline (VIBRAMYCIN) 100 MG capsule Take 100 mg by mouth 2 (two) times daily. X 7 days.   01/28/2015 at Unknown time  . furosemide (LASIX) 20 MG tablet  Take 20 mg by mouth daily as needed for edema.    01/26/2015  . hydrALAZINE (APRESOLINE) 25 MG tablet Take 1 tablet (25 mg total) by mouth every 8 (eight) hours. 60 tablet 0 01/28/2015 at Unknown time  . metoprolol succinate (TOPROL-XL) 25 MG 24 hr tablet Take 25 mg by mouth daily. (replaces carvedilol)   01/27/2015 at 2300  . nitroGLYCERIN (NITROSTAT) 0.4 MG SL tablet Place 0.4 mg under the tongue every 5 (five) minutes as needed for chest pain. After 3rd dose if no relief call 911   prn  . omeprazole (PRILOSEC) 20 MG capsule Take 20 mg by mouth daily.   01/28/2015 at Unknown time  . oxybutynin (DITROPAN-XL) 5 MG 24 hr tablet Take 5 mg by mouth at bedtime.   01/27/2015 at Unknown time  . potassium chloride (KLOR-CON 10) 10 MEQ tablet Take 10 mEq by mouth daily as needed. Take one whenever taking one furosemide as needed   01/26/2015  . vitamin B-12 (CYANOCOBALAMIN) 1000 MCG tablet Take 1,000 mcg by mouth daily.   01/28/2015 at Unknown time  . warfarin (COUMADIN) 2 MG tablet Take 2 mg by mouth every Wednesday. Note dose   01/25/2015  . warfarin (COUMADIN) 3 MG tablet Take 3 mg by mouth See admin instructions. Take 1 tablet orally every  day except on Wednesday take 2mg  tablet.   01/27/2015 at Unknown time    Current medications: Current Facility-Administered Medications  Medication Dose Route Frequency Provider Last Rate Last Dose  . acetaminophen (TYLENOL) tablet 650 mg  650 mg Oral Q6H PRN Lytle Butte, MD   650 mg at 01/30/15 1317  . aspirin tablet 162.5 mg  162.5 mg Oral Daily Lytle Butte, MD   162.5 mg at 01/31/15 0954  . benazepril (LOTENSIN) tablet 20 mg  20 mg Oral Daily Demetrios Loll, MD   20 mg at 01/31/15 0954  . diphenhydrAMINE (BENADRYL) capsule 25 mg  25 mg Oral QHS PRN Lytle Butte, MD   25 mg at 01/30/15 1830  . docusate sodium (COLACE) capsule 100 mg  100 mg Oral Daily Lytle Butte, MD   100 mg at 01/31/15 0955  . hydrALAZINE (APRESOLINE) injection 10 mg  10 mg Intravenous Q4H PRN Lytle Butte, MD      . hydrALAZINE (APRESOLINE) tablet 25 mg  25 mg Oral 3 times per day Lytle Butte, MD   25 mg at 01/31/15 1344  . latanoprost (XALATAN) 0.005 % ophthalmic solution 1 drop  1 drop Both Eyes QHS Lytle Butte, MD   1 drop at 01/30/15 2225  . meropenem (MERREM) 500 mg in sodium chloride 0.9 % 50 mL IVPB  500 mg Intravenous Q12H Darylene Price [place], [facility]      . metoprolol succinate (TOPROL-XL) 24 hr tablet 25 mg  25 mg Oral Daily Lytle Butte, MD   25 mg at 01/31/15 0954  . nitroGLYCERIN (NITROSTAT) SL tablet 0.4 mg  0.4 mg Sublingual Q5 min PRN Lytle Butte, MD      . ondansetron [facility]) tablet 4 mg  4 mg Oral Q6H PRN Nicholes Mango, MD       Or  . ondansetron (ZOFRAN) injection 4 mg  4 mg Intravenous Q6H PRN Nicholes Mango, MD      . oxybutynin (DITROPAN-XL) 24 hr tablet 5 mg  5 mg Oral QHS Lytle Butte, MD   5 mg at 01/30/15 2224  . pantoprazole (PROTONIX) EC tablet 40 mg  40 mg Oral Daily Lytle Butte, MD   40 mg at 01/31/15 0954  . sodium chloride tablet 1 g  1 g Oral TID WC Demetrios Loll, MD   1 g at 01/31/15 1344  . vitamin B-12 (CYANOCOBALAMIN) tablet 1,000 mcg  1,000 mcg Oral Daily Lytle Butte, MD   1,000 mcg at 01/31/15 0954  . warfarin (COUMADIN) tablet 3 mg  3 mg Oral q1800 Lytle Butte, MD   3 mg at 01/30/15 1751  . Warfarin - Pharmacist Dosing Inpatient   Does not apply q1800 Ramond Dial, RPH          Allergies: Allergies  Allergen Reactions  . Coreg [Carvedilol]   . Erythromycin Base Nausea And Vomiting  . Penicillins Swelling    "swelling of throat" Has patient had a PCN reaction causing immediate rash, facial/tongue/throat swelling, SOB or lightheadedness with hypotension: Yes Has patient had a PCN reaction causing severe rash involving mucus membranes or skin necrosis: No Has patient had a PCN reaction that required hospitalization No Has patient had a PCN reaction occurring within the last 10 years: No If all of the above answers are "NO", then may proceed with  Cephalosporin use.  . Vitamin D Analogs   . Cefuroxime Axetil Nausea Only and Rash  .  Morphine And Related Rash      Past Medical History: Past Medical History  Diagnosis Date  . Hypertension   . Arthritis     osteoarthritis  . Hyperlipidemia   . Temporal giant cell arteritis (HCC)     left eye  . Atrial fibrillation ([place])   . GERD (gastroesophageal reflux disease)   . CHF (congestive heart failure) ([place])   . Glaucoma   . DJD (degenerative joint disease)   . TIA (transient ischemic attack)   . Bell's palsy   . Cancer [facility])     skin     Past Surgical History: Past Surgical History  Procedure Laterality Date  . Carotid endarterectomy Right   . Appendectomy    . Abdominal hysterectomy    . Cholecystectomy    . Cardiac catheterization    . Joint replacement Bilateral     Right and Left knee Replacement  . Breast surgery Left     Excisional Breast Biopsy  . Tonsillectomy    . Eye surgery Right     Cataract Extraction--right eye only  . Pacemaker insertion Left 11/29/2014    Procedure: INSERTION PACEMAKER;  Surgeon: Isaias Cowman, MD;  Location: [facility] ORS;  Service: Cardiovascular;  Laterality: Left;     Family History: History reviewed. No pertinent family history.   Social History: Social History   Social History  . Marital Status: Widowed    Spouse Name: N/A  . Number of Children: N/A  . Years of Education: N/A   Occupational History  . Not on file.   Social History Main Topics  . Smoking status: Never Smoker   . Smokeless tobacco: Never Used  . Alcohol Use: No  . Drug Use: No  . Sexual Activity: Not on file   Other Topics Concern  . Not on file   Social History Narrative     Review of Systems: Unable to obtain from patient as she's confused.  Vital Signs: Blood pressure 158/61, pulse 60, temperature 97.9 F (36.6 C), temperature source Oral, resp. rate 18, height 5\' 6"  (1.676 m), weight 78.926 kg (174 lb), SpO2 94 %.  Weight  trends: Filed Weights   01/28/15 1551  Weight: 78.926 kg (174 lb)    Physical Exam: General: NAD, slender  Head: Normocephalic, atraumatic.  Eyes: Anicteric, EOMI  Nose: Mucous membranes moist, not inflammed, nonerythematous.  Throat: Oropharynx nonerythematous, no exudate appreciated.   Neck: Supple, trachea midline.  Lungs:  Normal respiratory effort. Clear to auscultation BL without crackles or wheezes.  Heart: RRR. S1 and S2 normal without gallop, murmur, or rubs.  Abdomen:  BS normoactive. Soft, Nondistended, non-tender.  No masses or organomegaly.  Extremities: No pretibial edema.  Neurologic: Awake, follows commands, confused however.  Skin: No visible rashes, scars.    Lab results: Basic Metabolic Panel:  Recent Labs Lab 01/29/15 0446 01/30/15 0403 01/31/15 0445  NA 122* 121* 120*  K 3.2* 3.6 3.4*  CL 88* 90* 87*  CO2 24 23 24   GLUCOSE 99 114* 110*  BUN 22* 17 13  CREATININE 0.78 0.72 0.66  CALCIUM 8.7* 8.8* 8.7*  MG 1.1* 1.6* 1.7    Liver Function Tests:  Recent Labs Lab 01/28/15 1556  AST 31  ALT 19  ALKPHOS 93  BILITOT 0.6  PROT 6.7  ALBUMIN 4.0   No results for input(s): LIPASE, AMYLASE in the last 168 hours. No results for input(s): AMMONIA in the last 168 hours.  CBC:  Recent Labs Lab 01/28/15 1556  WBC 7.0  HGB 10.8*  HCT 32.3*  MCV 86.5  PLT 143*    Cardiac Enzymes: No results for input(s): CKTOTAL, CKMB, CKMBINDEX, TROPONINI in the last 168 hours.  BNP: Invalid input(s): POCBNP  CBG: No results for input(s): GLUCAP in the last 168 hours.  Microbiology: Results for orders placed or performed during the hospital encounter of 01/28/15  MRSA PCR Screening     Status: None   Collection Time: 01/28/15  3:40 PM  Result Value Ref Range Status   MRSA by PCR NEGATIVE NEGATIVE Final    Comment:        The GeneXpert MRSA Assay (FDA approved for NASAL specimens only), is one component of a comprehensive MRSA  colonization surveillance program. It is not intended to diagnose MRSA infection nor to guide or monitor treatment for MRSA infections.   Urine culture     Status: None (Preliminary result)   Collection Time: 01/28/15  5:23 PM  Result Value Ref Range Status   Specimen Description URINE, CLEAN CATCH  Final   Special Requests NONE  Final   Culture   Final    50,000 COLONIES/mL GRAM NEGATIVE RODS >=100,000 COLONIES/mL GRAM NEGATIVE RODS IDENTIFICATION AND SUSCEPTIBILITIES TO FOLLOW    Report Status PENDING  Incomplete  Urine culture     Status: None   Collection Time: 01/29/15  7:00 AM  Result Value Ref Range Status   Specimen Description URINE, CLEAN CATCH  Final   Special Requests NONE  Final   Culture   Final    >=100,000 COLONIES/mL ESCHERICHIA COLI Results Called to: BRANDY MANSFIELD AT 1349 01/31/15 CTJ ESBL-EXTENDED SPECTRUM BETA LACTAMASE-THE ORGANISM IS RESISTANT TO PENICILLINS, CEPHALOSPORINS AND AZTREONAM ACCORDING TO CLSI M100-S15 VOL.[employer].    Report Status 01/31/2015 FINAL  Final   Organism ID, Bacteria ESCHERICHIA COLI  Final      Susceptibility   Escherichia coli - MIC*    AMPICILLIN >=32 RESISTANT Resistant     CEFAZOLIN >=64 RESISTANT Resistant     CEFTRIAXONE >=64 RESISTANT Resistant     CIPROFLOXACIN >=4 RESISTANT Resistant     GENTAMICIN <=1 SENSITIVE Sensitive     IMIPENEM <=0.25 SENSITIVE Sensitive     NITROFURANTOIN <=16 SENSITIVE Sensitive     TRIMETH/SULFA >=320 RESISTANT Resistant     Extended ESBL POSITIVE Resistant     PIP/TAZO Value in next row Sensitive      SENSITIVE<=4    * >=100,000 COLONIES/mL ESCHERICHIA COLI    Coagulation Studies:  Recent Labs  01/29/15 0447 01/30/15 0403 01/31/15 0445  LABPROT 20.4* 21.6* 20.4*  INR 1.73 1.86 1.73    Urinalysis:  Recent Labs  01/29/15 0700  COLORURINE YELLOW*  LABSPEC 1.009  PHURINE 6.0  GLUCOSEU NEGATIVE  HGBUR 1+*  BILIRUBINUR NEGATIVE  KETONESUR NEGATIVE  PROTEINUR  NEGATIVE  NITRITE NEGATIVE  LEUKOCYTESUR 3+*      Imaging:  No results found.   Assessment & Plan: Pt is a 81 y.o. yo female  with a PMHx of hypertension, osteoarthritis, hyperlipidemia, temporal arteritis, atrial fibrillation, GERD, congestive heart failure, glaucoma, history of TIA, Bell's palsy, who was admitted to [facility] on 01/28/2015 for evaluation of altered mental status.  1.  Hyponatremia. 2.  Hypokalemia. 3.  E. Coli UTI. 4.  Altered mental status.   Plan:   Patient presents with hyponatremia in the setting of use of Hydrocort thiazide as well as acute urinary tract infection. We will proceed with additional workup including serum osmolality, urine osmolality, uric  acid, TSH, and cortisol check. I suspect that there is some element of hypovolemia. Therefore we will start the patient on 0.9 normal saline at 50 cc per hour. We will check serum sodium every 4 hours for the next 24-48 hours. Patient is currently on meropenem for treatment of the urinary tract infection. We suspect that her mental status will improve with increase in sodium as well as treatment of the underlying urinary tract infection. Thank you for consultation.

## 2015-01-31 NOTE — Progress Notes (Addendum)
CRITICAL VALUE ALERT  Critical value received:  Urine Culture, ESBL positive  Date of notification:  01/31/2015  Time of notification:  1500  Critical value read back: yes  Nurse who received alert:  Truitt Leep, RN  MD notified (1st page):  Bridgett Larsson  Time of first page:    MD notified (2nd page):  Time of second page:  Responding MD:  Bridgett Larsson  Time MD responded:

## 2015-01-31 NOTE — Progress Notes (Signed)
ANTIBIOTIC CONSULT NOTE - INITIAL  Pharmacy Consult for meropenem Indication: ESBL UTI  Allergies  Allergen Reactions  . Coreg [Carvedilol]   . Erythromycin Base Nausea And Vomiting  . Penicillins Swelling    "swelling of throat" Has patient had a PCN reaction causing immediate rash, facial/tongue/throat swelling, SOB or lightheadedness with hypotension: Yes Has patient had a PCN reaction causing severe rash involving mucus membranes or skin necrosis: No Has patient had a PCN reaction that required hospitalization No Has patient had a PCN reaction occurring within the last 10 years: No If all of the above answers are "NO", then may proceed with Cephalosporin use.  . Vitamin D Analogs   . Cefuroxime Axetil Nausea Only and Rash  . Morphine And Related Rash    Patient Measurements: Height: 5\' 6"  (167.6 cm) Weight: 174 lb (78.926 kg) IBW/kg (Calculated) : 59.3  Vital Signs: Temp: 97.9 F (36.6 C) (10/11 1211) Temp Source: Oral (10/11 1211) BP: 158/61 mmHg (10/11 1343) Pulse Rate: 60 (10/11 1343) Intake/Output from previous day: 10/10 0701 - 10/11 0700 In: 360 [P.O.:360] Out: 300 [Urine:300] Intake/Output from this shift: Total I/O In: 240 [P.O.:240] Out: 200 [Urine:200]  Labs:  Recent Labs  01/28/15 1556 01/29/15 0446 01/30/15 0403 01/31/15 0445  WBC 7.0  --   --   --   HGB 10.8*  --   --   --   PLT 143*  --   --   --   CREATININE 0.91 0.78 0.72 0.66   Estimated Creatinine Clearance: 44.6 mL/min (by C-G formula based on Cr of 0.66).    Microbiology: Recent Results (from the past 720 hour(s))  MRSA PCR Screening     Status: None   Collection Time: 01/28/15  3:40 PM  Result Value Ref Range Status   MRSA by PCR NEGATIVE NEGATIVE Final    Comment:        The GeneXpert MRSA Assay (FDA approved for NASAL specimens only), is one component of a comprehensive MRSA colonization surveillance program. It is not intended to diagnose MRSA infection nor to guide  or monitor treatment for MRSA infections.   Urine culture     Status: None (Preliminary result)   Collection Time: 01/28/15  5:23 PM  Result Value Ref Range Status   Specimen Description URINE, CLEAN CATCH  Final   Special Requests NONE  Final   Culture   Final    50,000 COLONIES/mL GRAM NEGATIVE RODS >=100,000 COLONIES/mL GRAM NEGATIVE RODS IDENTIFICATION AND SUSCEPTIBILITIES TO FOLLOW    Report Status PENDING  Incomplete  Urine culture     Status: None   Collection Time: 01/29/15  7:00 AM  Result Value Ref Range Status   Specimen Description URINE, CLEAN CATCH  Final   Special Requests NONE  Final   Culture   Final    >=100,000 COLONIES/mL ESCHERICHIA COLI Results Called to: BRANDY MANSFIELD AT 1349 01/31/15 CTJ ESBL-EXTENDED SPECTRUM BETA LACTAMASE-THE ORGANISM IS RESISTANT TO PENICILLINS, CEPHALOSPORINS AND AZTREONAM ACCORDING TO CLSI M100-S15 VOL.[place].    Report Status 01/31/2015 FINAL  Final   Organism ID, Bacteria ESCHERICHIA COLI  Final      Susceptibility   Escherichia coli - MIC*    AMPICILLIN >=32 RESISTANT Resistant     CEFAZOLIN >=64 RESISTANT Resistant     CEFTRIAXONE >=64 RESISTANT Resistant     CIPROFLOXACIN >=4 RESISTANT Resistant     GENTAMICIN <=1 SENSITIVE Sensitive     IMIPENEM <=0.25 SENSITIVE Sensitive  NITROFURANTOIN <=16 SENSITIVE Sensitive     TRIMETH/SULFA >=320 RESISTANT Resistant     Extended ESBL POSITIVE Resistant     PIP/TAZO Value in next row Sensitive      SENSITIVE<=4    * >=100,000 COLONIES/mL ESCHERICHIA COLI    Medications:  Anti-infectives    Start     Dose/Rate Route Frequency Ordered Stop   01/31/15 1630  meropenem (MERREM) 500 mg in sodium chloride 0.9 % 50 mL IVPB     500 mg 100 mL/hr over 30 Minutes Intravenous Every 12 hours 01/31/15 1520     01/29/15 0600  Levofloxacin (LEVAQUIN) IVPB 250 mg  Status:  Discontinued     250 mg 50 mL/hr over 60 Minutes Intravenous Every 24 hours 01/29/15 0548 01/31/15 1503    01/28/15 2200  doxycycline (VIBRA-TABS) tablet 100 mg  Status:  Discontinued     100 mg Oral 2 times daily 01/28/15 2046 01/30/15 1324     Assessment: Pharmacy consulted to dose meropenem for ESBL-positive UTI  Plan:  Meropenem 500 mg IV q12h based on renal function and indication  Lenis Noon 01/31/2015,3:21 PM

## 2015-01-31 NOTE — Progress Notes (Signed)
ANTICOAGULATION CONSULT NOTE - Follow Up Consult  Pharmacy Consult for warfarin Indication: atrial fibrillation  Patient Measurements: Height: 5\' 6"  (167.6 cm) Weight: 174 lb (78.926 kg) IBW/kg (Calculated) : 59.3  Labs:  Recent Labs  01/28/15 1556 01/29/15 0446 01/29/15 0447 01/30/15 0403 01/31/15 0445  HGB 10.8*  --   --   --   --   HCT 32.3*  --   --   --   --   PLT 143*  --   --   --   --   LABPROT 19.0*  --  20.4* 21.6* 20.4*  INR 1.57  --  1.73 1.86 1.73  CREATININE 0.91 0.78  --  0.72 0.66    Estimated Creatinine Clearance: 44.6 mL/min (by C-G formula based on Cr of 0.66).   Assessment: Pharmacy dosing warfarin in this 81 year old female for atrial fibrillation. Home warfarin dose is 3mg  warfarin PO daily except for Wednesdays. Pt takes 2mg  warfarin on Wednesday.   Goal of Therapy:  INR 2-3   Plan:  INR therapeutic today at 1.73, continue with current regimen and give 3mg  dose of warfarin tonight.  Date: INR: Dose: 10/8 1.57 4.5 mg 10/9 1.73 3 mg 10/10 1.86 3 mg 10/11 1.73 3 mg  Darylene Price Edric Fetterman 01/31/2015,12:04 PM

## 2015-01-31 NOTE — Progress Notes (Addendum)
Patient c/o mild chest pressure, Dr. Manuella Ghazi notified. Nitro administered, will reassess. Wilnette Kales

## 2015-01-31 NOTE — Progress Notes (Addendum)
Joyce Vasquez at [place] NAME: Joyce Vasquez    MR#:  [id number]  DATE OF BIRTH:  Jun 25, 1916  SUBJECTIVE:  CHIEF COMPLAINT:   Chief Complaint  Patient presents with  . Dizziness   no complaint.  REVIEW OF SYSTEMS:  CONSTITUTIONAL: No fever, no generalized weakness.  EYES: No blurred or double vision.  EARS, NOSE, AND THROAT: No tinnitus or ear pain.  RESPIRATORY: No cough, shortness of breath, wheezing or hemoptysis.  CARDIOVASCULAR: No chest pain, orthopnea, edema.  GASTROINTESTINAL: No nausea, vomiting, diarrhea or abdominal pain.  GENITOURINARY: No dysuria, hematuria.  ENDOCRINE: No polyuria, nocturia,  HEMATOLOGY: No anemia, easy bruising or bleeding SKIN: No rash or lesion. MUSCULOSKELETAL: No joint pain or arthritis.   NEUROLOGIC: No tingling, numbness, weakness.  PSYCHIATRY: No anxiety or depression.   DRUG ALLERGIES:   Allergies  Allergen Reactions  . Coreg [Carvedilol]   . Erythromycin Base Nausea And Vomiting  . Penicillins Swelling    "swelling of throat" Has patient had a PCN reaction causing immediate rash, facial/tongue/throat swelling, SOB or lightheadedness with hypotension: Yes Has patient had a PCN reaction causing severe rash involving mucus membranes or skin necrosis: No Has patient had a PCN reaction that required hospitalization No Has patient had a PCN reaction occurring within the last 10 years: No If all of the above answers are "NO", then may proceed with Cephalosporin use.  . Vitamin D Analogs   . Cefuroxime Axetil Nausea Only and Rash  . Morphine And Related Rash    VITALS:  Blood pressure 165/62, pulse 58, temperature 97.9 F (36.6 C), temperature source Oral, resp. rate 18, height 5\' 6"  (1.676 m), weight 78.926 kg (174 lb), SpO2 94 %.  PHYSICAL EXAMINATION:  GENERAL:  81 y.o.-year-old patient lying in the bed with no acute distress.  EYES: Pupils equal, round, reactive to light and  accommodation. No scleral icterus. Extraocular muscles intact.  HEENT: Head atraumatic, normocephalic. Oropharynx and nasopharynx clear. Moist oral mucosa. NECK:  Supple, no jugular venous distention. No thyroid enlargement, no tenderness.  LUNGS: Normal breath sounds bilaterally, no wheezing, bilateral mild basilar rales. No use of accessory muscles of respiration.  CARDIOVASCULAR: S1, S2 normal. No murmurs, rubs, or gallops.  ABDOMEN: Soft, nontender, nondistended. Bowel sounds present. No organomegaly or mass.  EXTREMITIES: No pedal edema, cyanosis, or clubbing.  NEUROLOGIC: Cranial nerves II through XII are intact. Muscle strength 4/5 in all extremities. Sensation intact. Gait not checked.  PSYCHIATRIC: The patient is alert and oriented x 3.  SKIN: No obvious rash, lesion, or ulcer.    LABORATORY PANEL:   CBC  Recent Labs Lab 01/28/15 1556  WBC 7.0  HGB 10.8*  HCT 32.3*  PLT 143*   ------------------------------------------------------------------------------------------------------------------  Chemistries   Recent Labs Lab 01/28/15 1556  01/31/15 0445  NA 120*  < > 120*  K 3.5  < > 3.4*  CL 86*  < > 87*  CO2 25  < > 24  GLUCOSE 105*  < > 110*  BUN 27*  < > 13  CREATININE 0.91  < > 0.66  CALCIUM 9.0  < > 8.7*  MG  --   < > 1.7  AST 31  --   --   ALT 19  --   --   ALKPHOS 93  --   --   BILITOT 0.6  --   --   < > = values in this interval not displayed. ------------------------------------------------------------------------------------------------------------------  Cardiac Enzymes  No results for input(s): TROPONINI in the last 168 hours. ------------------------------------------------------------------------------------------------------------------  RADIOLOGY:  No results found.  EKG:   Orders placed or performed during the hospital encounter of 01/28/15  . EKG 12-Lead  . EKG 12-Lead    ASSESSMENT AND PLAN:   1. Altered mental status secondary to  hyponatremia/UTI. Mental status improved. Discontinued hydrochlorothiazide.   *  ESBL UTI, Recent history of acute cystitis, repeated urinalysis showed UTI,  urine culture shows ESBL E Coli. Discontinue IV levofloxacin and start meropenem, ID consult.   2. 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 Sodium level is still low. Discontinued hydrochlorothiazide which was started recently during previous admission in August. Restrict by mouth fluids to 1800 mL Resume IV fluids normal saline per Dr. Holley Vasquez, continue salt tablet and follow-up BMP.  3. Hypertension. Continue metoprolol, resumed benazepril.  4. History of chronic congestive heart failure and sick sinus syndrome Stable. hold Lasix due to hyponatremia  5. Chronic history of atrial fibrillation Continue Lopressor and Coumadin management per pharmacy, follow-up INR.  *Hypomagnesemiaimproved after IV magnesium. * Hypokalemia. Given potassium supplement and improved.  PT evaluation suggested no PT follow-up.  All the records are reviewed and case discussed with Care Management/Social Workerr. Management plans discussed with the patient, her daughter and they are in agreement. I discussed with Dr. Holley Vasquez.  CODE STATUS: DO NOT RESUSCITATE  TOTAL TIME TAKING CARE OF THIS PATIENT: 42 minutes.   POSSIBLE D/C IN 3 DAYS, DEPENDING ON CLINICAL CONDITION.   Demetrios Loll M.D on 01/31/2015 at 1:19 PM  Between 7am to 6pm - Pager - [phone]  After 6pm go to [email or web address] - password [email or web address] [place] [facility]  Office  [phone]  CC: Primary care physician; Joyce Carota, MD

## 2015-01-31 NOTE — Progress Notes (Signed)
CRITICAL VALUE ALERT  Critical value received:  Sodium  Date of notification:  01/31/2015  Time of notification:  0710  Critical value read back: yes  Nurse who received alert:  Truitt Leep  MD notified (1st page):  Dr. Manuella Ghazi, did not given new orders, asked for Dr. Holley Raring to paged.   Time of first page:  0710  MD notified (2nd page): [place]  Time of second page: 0712  Dr. Holley Raring responded at [phone], ordered to d/c NS and start on 3% saline at 32ml/hr  [facility]

## 2015-02-01 ENCOUNTER — Inpatient Hospital Stay: Payer: Medicare Other

## 2015-02-01 LAB — CBC WITH DIFFERENTIAL/PLATELET
BASOS ABS: 0 10*3/uL (ref 0–0.1)
Basophils Relative: 0 %
EOS ABS: 0 10*3/uL (ref 0–0.7)
EOS PCT: 1 %
HCT: 31.1 % — ABNORMAL LOW (ref 35.0–47.0)
HEMOGLOBIN: 10.4 g/dL — AB (ref 12.0–16.0)
LYMPHS PCT: 14 %
Lymphs Abs: 1 10*3/uL (ref 1.0–3.6)
MCH: 28.7 pg (ref 26.0–34.0)
MCHC: 33.5 g/dL (ref 32.0–36.0)
MCV: 85.7 fL (ref 80.0–100.0)
Monocytes Absolute: 0.4 10*3/uL (ref 0.2–0.9)
Monocytes Relative: 5 %
NEUTROS PCT: 80 %
Neutro Abs: 5.8 10*3/uL (ref 1.4–6.5)
PLATELETS: 146 10*3/uL — AB (ref 150–440)
RBC: 3.63 MIL/uL — AB (ref 3.80–5.20)
RDW: 14.7 % — ABNORMAL HIGH (ref 11.5–14.5)
WBC: 7.3 10*3/uL (ref 3.6–11.0)

## 2015-02-01 LAB — PROTEIN ELECTROPHORESIS, SERUM
A/G RATIO SPE: 1.4 (ref 0.7–1.7)
ALBUMIN ELP: 3.4 g/dL (ref 2.9–4.4)
ALPHA-1-GLOBULIN: 0.2 g/dL (ref 0.0–0.4)
Alpha-2-Globulin: 0.6 g/dL (ref 0.4–1.0)
Beta Globulin: 1 g/dL (ref 0.7–1.3)
Gamma Globulin: 0.7 g/dL (ref 0.4–1.8)
Globulin, Total: 2.5 g/dL (ref 2.2–3.9)
TOTAL PROTEIN ELP: 5.9 g/dL — AB (ref 6.0–8.5)

## 2015-02-01 LAB — BASIC METABOLIC PANEL
ANION GAP: 9 (ref 5–15)
BUN: 14 mg/dL (ref 6–20)
CALCIUM: 8.7 mg/dL — AB (ref 8.9–10.3)
CO2: 22 mmol/L (ref 22–32)
Chloride: 87 mmol/L — ABNORMAL LOW (ref 101–111)
Creatinine, Ser: 0.63 mg/dL (ref 0.44–1.00)
GLUCOSE: 118 mg/dL — AB (ref 65–99)
Potassium: 3.6 mmol/L (ref 3.5–5.1)
SODIUM: 118 mmol/L — AB (ref 135–145)

## 2015-02-01 LAB — URINE CULTURE: Culture: 50000

## 2015-02-01 LAB — SODIUM
SODIUM: 118 mmol/L — AB (ref 135–145)
SODIUM: 119 mmol/L — AB (ref 135–145)
Sodium: 118 mmol/L — CL (ref 135–145)

## 2015-02-01 LAB — PROTIME-INR
INR: 1.77
PROTHROMBIN TIME: 20.8 s — AB (ref 11.4–15.0)

## 2015-02-01 MED ORDER — WARFARIN SODIUM 4 MG PO TABS
4.0000 mg | ORAL_TABLET | Freq: Once | ORAL | Status: AC
  Administered 2015-02-01: 4 mg via ORAL
  Filled 2015-02-01: qty 1

## 2015-02-01 NOTE — Progress Notes (Addendum)
[place] [facility] [place] NAME: Joyce Vasquez    MR#:  [id number]  DATE OF BIRTH:  12/12/1916  SUBJECTIVE:  CHIEF COMPLAINT:   Chief Complaint  Patient presents with  . Dizziness   no complaint. According to patient's daughter, the patient had 1 episode of vomiting last night and has generalized weakness.  REVIEW OF SYSTEMS:  CONSTITUTIONAL: No fever, no generalized weakness.  EYES: No blurred or double vision.  EARS, NOSE, AND THROAT: No tinnitus or ear pain.  RESPIRATORY: No cough, shortness of breath, wheezing or hemoptysis.  CARDIOVASCULAR: No chest pain, orthopnea, edema.  GASTROINTESTINAL: No nausea, vomiting, diarrhea or abdominal pain.  GENITOURINARY: No dysuria, hematuria.  ENDOCRINE: No polyuria, nocturia,  HEMATOLOGY: No anemia, easy bruising or bleeding SKIN: No rash or lesion. MUSCULOSKELETAL: No joint pain or arthritis.   NEUROLOGIC: No tingling, numbness, weakness.  PSYCHIATRY: No anxiety or depression.   DRUG ALLERGIES:   Allergies  Allergen Reactions  . Coreg [Carvedilol]   . Erythromycin Base Nausea And Vomiting  . Penicillins Swelling    "swelling of throat" Has patient had a PCN reaction causing immediate rash, facial/tongue/throat swelling, SOB or lightheadedness with hypotension: Yes Has patient had a PCN reaction causing severe rash involving mucus membranes or skin necrosis: No Has patient had a PCN reaction that required hospitalization No Has patient had a PCN reaction occurring within the last 10 years: No If all of the above answers are "NO", then may proceed with Cephalosporin use.  . Vitamin D Analogs   . Cefuroxime Axetil Nausea Only and Rash  . Morphine And Related Rash    VITALS:  Blood pressure 161/69, pulse 67, temperature 97.4 F (36.3 C), temperature source Oral, resp. rate 18, height 5\' 6"  (1.676 m), weight 78.926 kg (174 lb), SpO2 97 %.  PHYSICAL EXAMINATION:  GENERAL:  81  y.o.-year-old patient lying in the bed with no acute distress, but looks tired.  EYES: Pupils equal, round, reactive to light and accommodation. No scleral icterus. Extraocular muscles intact.  HEENT: Head atraumatic, normocephalic. Oropharynx and nasopharynx clear. Moist oral mucosa. NECK:  Supple, no jugular venous distention. No thyroid enlargement, no tenderness.  LUNGS: Normal breath sounds bilaterally, no wheezing, bilateral mild basilar rales. No use of accessory muscles of respiration.  CARDIOVASCULAR: S1, S2 normal. No murmurs, rubs, or gallops.  ABDOMEN: Soft, nontender, nondistended. Bowel sounds present. No organomegaly or mass.  EXTREMITIES: No pedal edema, cyanosis, or clubbing.  NEUROLOGIC: Cranial nerves II through XII are intact. Muscle strength 3/5 in all extremities. Sensation intact. Gait not checked.  PSYCHIATRIC: The patient is alert and oriented x 3.  SKIN: No obvious rash, lesion, or ulcer.    LABORATORY PANEL:   CBC  Recent Labs Lab 02/01/15 0510  WBC 7.3  HGB 10.4*  HCT 31.1*  PLT 146*   ------------------------------------------------------------------------------------------------------------------  Chemistries   Recent Labs Lab 01/28/15 1556  01/31/15 0445  02/01/15 0510 02/01/15 0903  NA 120*  < > 120*  < > 118* 118*  K 3.5  < > 3.4*  --  3.6  --   CL 86*  < > 87*  --  87*  --   CO2 25  < > 24  --  22  --   GLUCOSE 105*  < > 110*  --  118*  --   BUN 27*  < > 13  --  14  --   CREATININE 0.91  < > 0.66  --  0.63  --   CALCIUM 9.0  < > 8.7*  --  8.7*  --   MG  --   < > 1.7  --   --   --   AST 31  --   --   --   --   --   ALT 19  --   --   --   --   --   ALKPHOS 93  --   --   --   --   --   BILITOT 0.6  --   --   --   --   --   < > = values in this interval not displayed. ------------------------------------------------------------------------------------------------------------------  Cardiac Enzymes No results for input(s): TROPONINI in  the last 168 hours. ------------------------------------------------------------------------------------------------------------------  RADIOLOGY:  No results found.  EKG:   Orders placed or performed during the hospital encounter of 01/28/15  . EKG 12-Lead  . EKG 12-Lead    ASSESSMENT AND PLAN:   1. Altered mental status secondary to hyponatremia/UTI. Mental status improved. Discontinued hydrochlorothiazide.   *  ESBL UTI, Recent history of acute cystitis, repeated urinalysis showed UTI,  urine culture shows ESBL E Coli. Discontinued IV levofloxacin and start meropenem, follow-up ID consult.   2. 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  Discontinued hydrochlorothiazide which was started recently during previous admission in August. Restrict by mouth fluids to 1800 mL Sodium level decreased to 113 last night. Dr. Holley Raring started 3% saline, sodium increased to 118 today. Follow-up sodium level every 4 hours.   3. Hypertension. Continue metoprolol, benazepril and hydralazine.  4. History of chronic congestive heart failure and sick sinus syndrome Stable. hold Lasix due to hyponatremia. But has basilar rales, will get CXR.  5. Chronic history of atrial fibrillation Continue Lopressor and Coumadin management per pharmacy, follow-up INR.  *Hypomagnesemia, improved after IV magnesium. * Hypokalemia. Given potassium supplement and improved.  PT reevaluation.  All the records are reviewed and case discussed with Care Management/Social Workerr. Management plans discussed with the patient, her daughter and they are in agreement.  I discussed with Dr. Holley Raring.  CODE STATUS: DO NOT RESUSCITATE  TOTAL TIME TAKING CARE OF THIS PATIENT: 39 minutes.   POSSIBLE D/C IN 3 DAYS, DEPENDING ON CLINICAL CONDITION.   Demetrios Loll M.D on 02/01/2015 at 2:03 PM  Between 7am to 6pm - Pager - [phone]  After 6pm go to [email or web address] - password [email or web address] [place]  [facility]  Office  [phone]  CC: Primary care physician; Chrisandra Carota, MD

## 2015-02-01 NOTE — Progress Notes (Signed)
Patient has no acute event overnight. She remained hemodynamically stable with VS WDL for patient. Patient has a dropped in her sodium lab value, patient was switched from 0.9 NS to 3 NS. Patient's sodium level increased from 113 to 118. Patient c/o chest pressure was relieved with 1 time dose of sublingual nitro. She denied any further pain or nausea. Daughter was at the bedside overnight.  PRN hydralazine was administered for systolic KM>628. BP rechecked SBP came down to 150s

## 2015-02-01 NOTE — Progress Notes (Signed)
ANTICOAGULATION CONSULT NOTE - Follow Up Consult  Pharmacy Consult for warfarin Indication: atrial fibrillation  Vital Signs: Temp: 98.4 F (36.9 C) (10/12 0453) Temp Source: Oral (10/12 0453) BP: 150/45 mmHg (10/12 1008) Pulse Rate: 67 (10/12 1008)  Labs:  Recent Labs  01/30/15 0403 01/31/15 0445 02/01/15 0510  HGB  --   --  10.4*  HCT  --   --  31.1*  PLT  --   --  146*  LABPROT 21.6* 20.4* 20.8*  INR 1.86 1.73 1.77  CREATININE 0.72 0.66 0.63    Assessment: INR subtherapeutic at 1.77 today  Goal of Therapy:  INR 2-3 Monitor platelets by anticoagulation protocol: Yes   Plan:  Increase warfarin dose and give 4mg  dose tonight. Order INR for tomorrow morning.  Joyce Vasquez 02/01/2015,11:18 AM

## 2015-02-01 NOTE — Progress Notes (Signed)
Patient sleeping at this time. Daughter/POA educated on PICC - consent signed. [facility] called, stated someone is already here doing a few other patients, but will put her on the list.

## 2015-02-01 NOTE — Progress Notes (Signed)
Per Dr. Holley Raring, Protein Electro 24hr urine, should be changed to just a one time sample test.  Toniann Ket, RN

## 2015-02-01 NOTE — Progress Notes (Signed)
[facility]  ROUNDING NOTE   Subjective:  Sodium slowly rising. Currently 118.    Objective:  Vital signs in last 24 hours:  Temp:  [97.4 F (36.3 C)-98.4 F (36.9 C)] 97.4 F (36.3 C) (10/12 1210) Pulse Rate:  [57-67] 67 (10/12 1210) Resp:  [18-23] 18 (10/12 1210) BP: (150-185)/(45-75) 161/69 mmHg (10/12 1210) SpO2:  [96 %-98 %] 97 % (10/12 1210)  Weight change:  Filed Weights   01/28/15 1551  Weight: 78.926 kg (174 lb)    Intake/Output: I/O last 3 completed shifts: In: 620.8 [P.O.:240; I.V.:330.8; IV Piggyback:50] Out: 600 [Urine:600]   Intake/Output this shift:  Total I/O In: 50 [IV Piggyback:50] Out: 100 [Urine:100]  Physical Exam: General: NAD, sitting up in bed  Head: Normocephalic, atraumatic. Moist oral mucosal membranes  Eyes: Anicteric  Neck: Supple, trachea midline  Lungs:  Mild basilar rales  Heart: Regular rate and rhythm  Abdomen:  Soft, nontender, BS present  Extremities:  trace peripheral edema.  Neurologic: Awake, alert, but confused.   Skin: No lesions       Basic Metabolic Panel:  Recent Labs Lab 01/28/15 1556 01/29/15 0446 01/30/15 0403 01/31/15 0445 01/31/15 1730 01/31/15 2101 02/01/15 0510 02/01/15 0903 02/01/15 1331  NA 120* 122* 121* 120* 114* 113* 118* 118* 118*  K 3.5 3.2* 3.6 3.4*  --   --  3.6  --   --   CL 86* 88* 90* 87*  --   --  87*  --   --   CO2 25 24 23 24   --   --  22  --   --   GLUCOSE 105* 99 114* 110*  --   --  118*  --   --   BUN 27* 22* 17 13  --   --  14  --   --   CREATININE 0.91 0.78 0.72 0.66  --   --  0.63  --   --   CALCIUM 9.0 8.7* 8.8* 8.7*  --   --  8.7*  --   --   MG  --  1.1* 1.6* 1.7  --   --   --   --   --     Liver Function Tests:  Recent Labs Lab 01/28/15 1556  AST 31  ALT 19  ALKPHOS 93  BILITOT 0.6  PROT 6.7  ALBUMIN 4.0   No results for input(s): LIPASE, AMYLASE in the last 168 hours. No results for input(s): AMMONIA in the last 168 hours.  CBC:  Recent  Labs Lab 01/28/15 1556 02/01/15 0510  WBC 7.0 7.3  NEUTROABS  --  5.8  HGB 10.8* 10.4*  HCT 32.3* 31.1*  MCV 86.5 85.7  PLT 143* 146*    Cardiac Enzymes: No results for input(s): CKTOTAL, CKMB, CKMBINDEX, TROPONINI in the last 168 hours.  BNP: Invalid input(s): POCBNP  CBG: No results for input(s): GLUCAP in the last 168 hours.  Microbiology: Results for orders placed or performed during the hospital encounter of 01/28/15  MRSA PCR Screening     Status: None   Collection Time: 01/28/15  3:40 PM  Result Value Ref Range Status   MRSA by PCR NEGATIVE NEGATIVE Final    Comment:        The GeneXpert MRSA Assay (FDA approved for NASAL specimens only), is one component of a comprehensive MRSA colonization surveillance program. It is not intended to diagnose MRSA infection nor to guide or monitor treatment for MRSA infections.   Urine  culture     Status: None   Collection Time: 01/28/15  5:23 PM  Result Value Ref Range Status   Specimen Description URINE, CLEAN CATCH  Final   Special Requests NONE  Final   Culture   Final    50,000 COLONIES/mL KLEBSIELLA PNEUMONIAE >=100,000 COLONIES/mL ESCHERICHIA COLI Results Called to: DONNISHA ROBERTSON AT 1212 ON 10/102/16 CTJ ESBL-EXTENDED SPECTRUM BETA LACTAMASE-THE ORGANISM IS RESISTANT TO PENICILLINS, CEPHALOSPORINS AND AZTREONAM ACCORDING TO CLSI M100-S15 VOL.[place].    Report Status 02/01/2015 FINAL  Final   Organism ID, Bacteria KLEBSIELLA PNEUMONIAE  Final   Organism ID, Bacteria ESCHERICHIA COLI  Final      Susceptibility   Escherichia coli - MIC*    AMPICILLIN >=32 RESISTANT Resistant     CEFAZOLIN >=64 RESISTANT Resistant     CEFTRIAXONE >=64 RESISTANT Resistant     CIPROFLOXACIN >=4 RESISTANT Resistant     GENTAMICIN <=1 SENSITIVE Sensitive     IMIPENEM <=0.25 SENSITIVE Sensitive     NITROFURANTOIN <=16 SENSITIVE Sensitive     TRIMETH/SULFA >=320 RESISTANT Resistant     Extended ESBL POSITIVE Resistant      PIP/TAZO Value in next row Sensitive      SENSITIVE<=4    ERTAPENEM Value in next row Sensitive      SENSITIVE<=0.5    * >=100,000 COLONIES/mL ESCHERICHIA COLI   Klebsiella pneumoniae - MIC*    AMPICILLIN Value in next row Resistant      SENSITIVE<=0.5    CEFAZOLIN Value in next row Sensitive      SENSITIVE<=0.5    CEFTRIAXONE Value in next row Sensitive      SENSITIVE<=0.5    CIPROFLOXACIN Value in next row Sensitive      SENSITIVE<=0.5    GENTAMICIN Value in next row Sensitive      SENSITIVE<=0.5    IMIPENEM Value in next row Sensitive      SENSITIVE<=0.5    NITROFURANTOIN Value in next row Intermediate      SENSITIVE<=0.5    TRIMETH/SULFA Value in next row Sensitive      SENSITIVE<=0.5    PIP/TAZO Value in next row Sensitive      SENSITIVE<=4    ERTAPENEM Value in next row Sensitive      SENSITIVE<=0.5    * 50,000 COLONIES/mL KLEBSIELLA PNEUMONIAE  Urine culture     Status: None   Collection Time: 01/29/15  7:00 AM  Result Value Ref Range Status   Specimen Description URINE, CLEAN CATCH  Final   Special Requests NONE  Final   Culture   Final    >=100,000 COLONIES/mL ESCHERICHIA COLI Results Called to: BRANDY MANSFIELD AT 1349 01/31/15 CTJ ESBL-EXTENDED SPECTRUM BETA LACTAMASE-THE ORGANISM IS RESISTANT TO PENICILLINS, CEPHALOSPORINS AND AZTREONAM ACCORDING TO CLSI M100-S15 VOL.[place].    Report Status 01/31/2015 FINAL  Final   Organism ID, Bacteria ESCHERICHIA COLI  Final      Susceptibility   Escherichia coli - MIC*    AMPICILLIN >=32 RESISTANT Resistant     CEFAZOLIN >=64 RESISTANT Resistant     CEFTRIAXONE >=64 RESISTANT Resistant     CIPROFLOXACIN >=4 RESISTANT Resistant     GENTAMICIN <=1 SENSITIVE Sensitive     IMIPENEM <=0.25 SENSITIVE Sensitive     NITROFURANTOIN <=16 SENSITIVE Sensitive     TRIMETH/SULFA >=320 RESISTANT Resistant     Extended ESBL POSITIVE Resistant     PIP/TAZO Value in next row Sensitive      SENSITIVE<=4    * >=  100,000  COLONIES/mL ESCHERICHIA COLI    Coagulation Studies:  Recent Labs  01/30/15 0403 01/31/15 0445 02/01/15 0510  LABPROT 21.6* 20.4* 20.8*  INR 1.86 1.73 1.77    Urinalysis: No results for input(s): COLORURINE, LABSPEC, PHURINE, GLUCOSEU, HGBUR, BILIRUBINUR, KETONESUR, PROTEINUR, UROBILINOGEN, NITRITE, LEUKOCYTESUR in the last 72 hours.  Invalid input(s): APPERANCEUR    Imaging: No results found.   Medications:   . sodium chloride (hypertonic) 25 mL/hr (01/31/15 2000)   . aspirin  162.5 mg Oral Daily  . benazepril  20 mg Oral Daily  . docusate sodium  100 mg Oral Daily  . hydrALAZINE  25 mg Oral 3 times per day  . latanoprost  1 drop Both Eyes QHS  . meropenem (MERREM) IV  500 mg Intravenous Q12H  . metoprolol succinate  25 mg Oral Daily  . oxybutynin  5 mg Oral QHS  . pantoprazole  40 mg Oral Daily  . sodium chloride  1 g Oral TID WC  . vitamin B-12  1,000 mcg Oral Daily  . warfarin  4 mg Oral ONCE-1800  . Warfarin - Pharmacist Dosing Inpatient   Does not apply q1800   acetaminophen, diphenhydrAMINE, hydrALAZINE, nitroGLYCERIN, ondansetron **OR** ondansetron (ZOFRAN) IV, promethazine  Assessment/ Plan:  81 y.o. female with a PMHx of hypertension, osteoarthritis, hyperlipidemia, temporal arteritis, atrial fibrillation, GERD, congestive heart failure, glaucoma, history of TIA, Bell's palsy, who was admitted to [facility] on 01/28/2015 for evaluation of altered mental status.  1. Hyponatremia. 2. Hypokalemia. 3. E. Coli UTI. 4. Altered mental status.   Plan:  Patient still has quite significant hyponatremia.  Serum sodium had fallen as low as 113.  Currently up to 118.  We will increase 3% saline to 40 cc per hour.  Continue to monitor serum sodium closely.  Continue treatment of UTI with meropenem at this time.  LOS: 4 Peri Kreft 10/12/20162:45 PM

## 2015-02-01 NOTE — Progress Notes (Signed)
Notified Dr. Bridgett Larsson of critical lab value: Na 118. Per family request informed MD tremor with the right arm. Per MD continue to monitor patient. Patient is having some expiratory wheezing, MD ordered CXR. Will continue to monitor patient.

## 2015-02-01 NOTE — Progress Notes (Signed)
Demetria from lab called with critical lab value of sodium of 118. Notified Dr. Bridgett Larsson.

## 2015-02-01 NOTE — Progress Notes (Signed)
Called Dr. Holley Raring to clarify order for sodium chloride 3% dose change. Per pharmacy has to be administered via central line only. Dr. Holley Raring requested I notify Dr. Bridgett Larsson for a PICC line consult. Order for PICC line placement per Dr. Bridgett Larsson. Facility can not place line will contact [employer].

## 2015-02-01 NOTE — Care Management Important Message (Signed)
Important Message  Patient Details  Name: Joyce Vasquez MRN: [id number] Date of Birth: 04-11-1917   Medicare Important Message Given:  Yes-second notification given    Katrina Stack, RN 02/01/2015, 5:40 PM

## 2015-02-01 NOTE — Consult Note (Signed)
[facility] Infectious Disease     Reason for Consult:ESBL E coli UTI    Referring Physician: Estanislado Spire Date of Admission:  01/28/2015   Active Problems:   Hyponatremia   HPI: Joyce Vasquez is a 81 y.o. female with a known history of hypertension, chronic congestive heart failure, Bell's policy, hyperlipidemia, chronic atrial fibrillation on Coumadin and multiple other medical problems admitted with hyponatremia. She had recently been dxed with a UTI as otpt with AMS sxs. Had started doxy but admitted based on low Na.  Since admission cx has been + for ESBL E coli and Klebsiella. UA on admission showed TNTC WBC.  Has been on levo since admit but started meropenem 10/11.  Currently less confused, no dysuria. No fevers chills since admit. WBC nml She is allergic to PCN, Cefuroxime and erythromycin.   Past Medical History  Diagnosis Date  . Hypertension   . Arthritis     osteoarthritis  . Hyperlipidemia   . Temporal giant cell arteritis (HCC)     left eye  . Atrial fibrillation ([place])   . GERD (gastroesophageal reflux disease)   . CHF (congestive heart failure) ([place])   . Glaucoma   . DJD (degenerative joint disease)   . TIA (transient ischemic attack)   . Bell's palsy   . Cancer [facility])     skin   Past Surgical History  Procedure Laterality Date  . Carotid endarterectomy Right   . Appendectomy    . Abdominal hysterectomy    . Cholecystectomy    . Cardiac catheterization    . Joint replacement Bilateral     Right and Left knee Replacement  . Breast surgery Left     Excisional Breast Biopsy  . Tonsillectomy    . Eye surgery Right     Cataract Extraction--right eye only  . Pacemaker insertion Left 11/29/2014    Procedure: INSERTION PACEMAKER;  Surgeon: Isaias Cowman, MD;  Location: [facility] ORS;  Service: Cardiovascular;  Laterality: Left;   Social History  Substance Use Topics  . Smoking status: Never Smoker   . Smokeless tobacco: Never Used  . Alcohol Use: No   History  reviewed. No pertinent family history.  Allergies:  Allergies  Allergen Reactions  . Coreg [Carvedilol]   . Erythromycin Base Nausea And Vomiting  . Penicillins Swelling    "swelling of throat" Has patient had a PCN reaction causing immediate rash, facial/tongue/throat swelling, SOB or lightheadedness with hypotension: Yes Has patient had a PCN reaction causing severe rash involving mucus membranes or skin necrosis: No Has patient had a PCN reaction that required hospitalization No Has patient had a PCN reaction occurring within the last 10 years: No If all of the above answers are "NO", then may proceed with Cephalosporin use.  . Vitamin D Analogs   . Cefuroxime Axetil Nausea Only and Rash  . Morphine And Related Rash    Current antibiotics: Antibiotics Given (last 72 hours)    Date/Time Action Medication Dose Rate   01/30/15 0621 Given   Levofloxacin (LEVAQUIN) IVPB 250 mg 250 mg 50 mL/hr   01/30/15 0909 Given   doxycycline (VIBRA-TABS) tablet 100 mg 100 mg    01/31/15 0600 Given   Levofloxacin (LEVAQUIN) IVPB 250 mg 250 mg 50 mL/hr   01/31/15 1723 Given   meropenem (MERREM) 500 mg in sodium chloride 0.9 % 50 mL IVPB 500 mg 100 mL/hr   02/01/15 0825 Given   meropenem (MERREM) 500 mg in sodium chloride 0.9 % 50  mL IVPB 500 mg 100 mL/hr   02/01/15 2124 Given   meropenem (MERREM) 500 mg in sodium chloride 0.9 % 50 mL IVPB 500 mg 100 mL/hr      MEDICATIONS: . aspirin  162.5 mg Oral Daily  . benazepril  20 mg Oral Daily  . docusate sodium  100 mg Oral Daily  . hydrALAZINE  25 mg Oral 3 times per day  . latanoprost  1 drop Both Eyes QHS  . meropenem (MERREM) IV  500 mg Intravenous Q12H  . metoprolol succinate  25 mg Oral Daily  . oxybutynin  5 mg Oral QHS  . pantoprazole  40 mg Oral Daily  . sodium chloride  1 g Oral TID WC  . vitamin B-12  1,000 mcg Oral Daily  . Warfarin - Pharmacist Dosing Inpatient   Does not apply q1800    Review of Systems - 11 systems reviewed  and negative per HPI   OBJECTIVE: Temp:  [97.4 F (36.3 C)-98.4 F (36.9 C)] 97.4 F (36.3 C) (10/12 1210) Pulse Rate:  [60-67] 67 (10/12 1210) Resp:  [18-22] 18 (10/12 1210) BP: (150-185)/(45-73) 161/69 mmHg (10/12 1210) SpO2:  [96 %-98 %] 97 % (10/12 1210) Physical Exam  Constitutional:  Quiet, lying in med.Frail  appears well-developed and well-nourished. No distress.  HENT: [place]/AT, PERRLA, no scleral icterus Mouth/Throat: Oropharynx is clear and moist. No oropharyngeal exudate.  Cardiovascular: Normal rate, regular rhythm and normal heart sounds. Exam reveals no gallop and no friction rub.  No murmur heard.  Pulmonary/Chest: Effort normal and breath sounds normal. No respiratory distress.  has no wheezes.  Neck = supple, no nuchal rigidity Abdominal: Soft. Bowel sounds are normal.  exhibits no distension. There is no tenderness.  Lymphadenopathy: no cervical adenopathy. No axillary adenopathy Neurological: alert and oriented to person, place, and time.  Skin: Skin is warm and dry. No rash noted. No erythema.  Psychiatric: a normal mood and affect.  behavior is normal.    LABS: Results for orders placed or performed during the hospital encounter of 01/28/15 (from the past 48 hour(s))  Protime-INR     Status: Abnormal   Collection Time: 01/31/15  4:45 AM  Result Value Ref Range   Prothrombin Time 20.4 (H) 11.4 - 15.0 seconds   INR 1.73   Magnesium     Status: None   Collection Time: 01/31/15  4:45 AM  Result Value Ref Range   Magnesium 1.7 1.7 - 2.4 mg/dL  Basic metabolic panel     Status: Abnormal   Collection Time: 01/31/15  4:45 AM  Result Value Ref Range   Sodium 120 (L) 135 - 145 mmol/L   Potassium 3.4 (L) 3.5 - 5.1 mmol/L   Chloride 87 (L) 101 - 111 mmol/L   CO2 24 22 - 32 mmol/L   Glucose, Bld 110 (H) 65 - 99 mg/dL   BUN 13 6 - 20 mg/dL   Creatinine, Ser 0.66 0.44 - 1.00 mg/dL   Calcium 8.7 (L) 8.9 - 10.3 mg/dL   GFR calc non Af Amer >60 >60 mL/min   GFR calc  Af Amer >60 >60 mL/min    Comment: (NOTE) The eGFR has been calculated using the CKD EPI equation. This calculation has not been validated in all clinical situations. eGFR's persistently <60 mL/min signify possible Chronic Kidney Disease.    Anion gap 9 5 - 15  Osmolality     Status: Abnormal   Collection Time: 01/31/15  5:30 PM  Result Value Ref  Range   Osmolality 238 (LL) 275 - 295 mOsm/kg    Comment: CRITICAL RESULT CALLED TO, READ BACK BY AND VERIFIED WITH: PUNJI BAKARE AT 1955 01/31/2015 BY TFK   Cortisol     Status: None   Collection Time: 01/31/15  5:30 PM  Result Value Ref Range   Cortisol, Plasma 15.0 ug/dL    Comment: (NOTE) AM    6.7 - 22.6 ug/dL PM   <10.0       ug/dL Performed at [facility]   TSH     Status: None   Collection Time: 01/31/15  5:30 PM  Result Value Ref Range   TSH 4.400 0.350 - 4.500 uIU/mL  Protein electrophoresis, serum     Status: Abnormal   Collection Time: 01/31/15  5:30 PM  Result Value Ref Range   Total Protein ELP 5.9 (L) 6.0 - 8.5 g/dL   Albumin ELP 3.4 2.9 - 4.4 g/dL   Alpha-1-Globulin 0.2 0.0 - 0.4 g/dL   Alpha-2-Globulin 0.6 0.4 - 1.0 g/dL   Beta Globulin 1.0 0.7 - 1.3 g/dL   Gamma Globulin 0.7 0.4 - 1.8 g/dL   M-Spike, % Not Observed Not Observed g/dL   SPE Interp. Comment     Comment: (NOTE) The SPE pattern appears essentially unremarkable. Evidence of monoclonal protein is not apparent. Performed At: [facility] [place], [place] [id number] Lindon Romp MD VZ:[id number]    Comment Comment     Comment: (NOTE) Protein electrophoresis scan will follow via computer, mail, or courier delivery.    GLOBULIN, TOTAL 2.5 2.2 - 3.9 g/dL   A/G Ratio 1.4 0.7 - 1.7  Uric acid     Status: None   Collection Time: 01/31/15  5:30 PM  Result Value Ref Range   Uric Acid, Serum 4.2 2.3 - 6.6 mg/dL  Sodium     Status: Abnormal   Collection Time: 01/31/15  5:30 PM  Result Value Ref Range   Sodium 114  (LL) 135 - 145 mmol/L    Comment: CRITICAL RESULT CALLED TO, READ BACK BY AND VERIFIED WITH: RESULT REPEATED AND VERIFIED BRANDI MANCEFIELD ON 01/31/15 AT 1906PM BY TB   Sodium, urine, random     Status: None   Collection Time: 01/31/15  5:54 PM  Result Value Ref Range   Sodium, Ur 51 mmol/L  Osmolality, urine     Status: None   Collection Time: 01/31/15  5:54 PM  Result Value Ref Range   Osmolality, Ur 344 300 - 900 mOsm/kg  Sodium     Status: Abnormal   Collection Time: 01/31/15  9:01 PM  Result Value Ref Range   Sodium 113 (LL) 135 - 145 mmol/L    Comment: CRITICAL RESULT CALLED TO, READ BACK BY AND VERIFIED WITH TUNJI BAKARE AT 2149 ON 01/31/15 RWW   Protime-INR     Status: Abnormal   Collection Time: 02/01/15  5:10 AM  Result Value Ref Range   Prothrombin Time 20.8 (H) 11.4 - 15.0 seconds   INR 1.28   Basic metabolic panel     Status: Abnormal   Collection Time: 02/01/15  5:10 AM  Result Value Ref Range   Sodium 118 (LL) 135 - 145 mmol/L    Comment: CRITICAL RESULT CALLED TO, READ BACK BY AND VERIFIED WITH TUNJI BAKARE AT 0559 ON 02/01/15.Marland KitchenMarland KitchenLa Paz Regional    Potassium 3.6 3.5 - 5.1 mmol/L   Chloride 87 (L) 101 - 111 mmol/L   CO2 22 22 - 32 mmol/L  Glucose, Bld 118 (H) 65 - 99 mg/dL   BUN 14 6 - 20 mg/dL   Creatinine, Ser 0.63 0.44 - 1.00 mg/dL   Calcium 8.7 (L) 8.9 - 10.3 mg/dL   GFR calc non Af Amer >60 >60 mL/min   GFR calc Af Amer >60 >60 mL/min    Comment: (NOTE) The eGFR has been calculated using the CKD EPI equation. This calculation has not been validated in all clinical situations. eGFR's persistently <60 mL/min signify possible Chronic Kidney Disease.    Anion gap 9 5 - 15  CBC with Differential/Platelet     Status: Abnormal   Collection Time: 02/01/15  5:10 AM  Result Value Ref Range   WBC 7.3 3.6 - 11.0 K/uL   RBC 3.63 (L) 3.80 - 5.20 MIL/uL   Hemoglobin 10.4 (L) 12.0 - 16.0 g/dL   HCT 31.1 (L) 35.0 - 47.0 %   MCV 85.7 80.0 - 100.0 fL   MCH 28.7 26.0 -  34.0 pg   MCHC 33.5 32.0 - 36.0 g/dL   RDW 14.7 (H) 11.5 - 14.5 %   Platelets 146 (L) 150 - 440 K/uL   Neutrophils Relative % 80 %   Neutro Abs 5.8 1.4 - 6.5 K/uL   Lymphocytes Relative 14 %   Lymphs Abs 1.0 1.0 - 3.6 K/uL   Monocytes Relative 5 %   Monocytes Absolute 0.4 0.2 - 0.9 K/uL   Eosinophils Relative 1 %   Eosinophils Absolute 0.0 0 - 0.7 K/uL   Basophils Relative 0 %   Basophils Absolute 0.0 0 - 0.1 K/uL  Sodium     Status: Abnormal   Collection Time: 02/01/15  9:03 AM  Result Value Ref Range   Sodium 118 (LL) 135 - 145 mmol/L    Comment: CRITICAL RESULT CALLED TO, READ BACK BY AND VERIFIED WITH DANISHA ROBERSON AT 6734 02/01/15 DAS   Sodium     Status: Abnormal   Collection Time: 02/01/15  1:31 PM  Result Value Ref Range   Sodium 118 (LL) 135 - 145 mmol/L    Comment: CRITICAL RESULT CALLED TO, READ BACK BY AND VERIFIED WITH DANISHA ROBERTSON AT 1444 ON 02/01/15 BY MLZ   Sodium     Status: Abnormal   Collection Time: 02/01/15  7:11 PM  Result Value Ref Range   Sodium 119 (LL) 135 - 145 mmol/L    Comment: CRITICAL RESULT CALLED TO, READ BACK BY AND VERIFIED WITH ANTHONY STANFIELD AT 2003 ON 02/01/15 BY MLZ    No components found for: ESR, C REACTIVE PROTEIN MICRO: Recent Results (from the past 720 hour(s))  MRSA PCR Screening     Status: None   Collection Time: 01/28/15  3:40 PM  Result Value Ref Range Status   MRSA by PCR NEGATIVE NEGATIVE Final    Comment:        The GeneXpert MRSA Assay (FDA approved for NASAL specimens only), is one component of a comprehensive MRSA colonization surveillance program. It is not intended to diagnose MRSA infection nor to guide or monitor treatment for MRSA infections.   Urine culture     Status: None   Collection Time: 01/28/15  5:23 PM  Result Value Ref Range Status   Specimen Description URINE, CLEAN CATCH  Final   Special Requests NONE  Final   Culture   Final    50,000 COLONIES/mL KLEBSIELLA  PNEUMONIAE >=100,000 COLONIES/mL ESCHERICHIA COLI Results Called to: DONNISHA ROBERTSON AT 1212 ON 10/102/16 CTJ ESBL-EXTENDED SPECTRUM BETA LACTAMASE-THE  ORGANISM IS RESISTANT TO PENICILLINS, CEPHALOSPORINS AND AZTREONAM ACCORDING TO CLSI M100-S15 VOL.[place].    Report Status 02/01/2015 FINAL  Final   Organism ID, Bacteria KLEBSIELLA PNEUMONIAE  Final   Organism ID, Bacteria ESCHERICHIA COLI  Final      Susceptibility   Escherichia coli - MIC*    AMPICILLIN >=32 RESISTANT Resistant     CEFAZOLIN >=64 RESISTANT Resistant     CEFTRIAXONE >=64 RESISTANT Resistant     CIPROFLOXACIN >=4 RESISTANT Resistant     GENTAMICIN <=1 SENSITIVE Sensitive     IMIPENEM <=0.25 SENSITIVE Sensitive     NITROFURANTOIN <=16 SENSITIVE Sensitive     TRIMETH/SULFA >=320 RESISTANT Resistant     Extended ESBL POSITIVE Resistant     PIP/TAZO Value in next row Sensitive      SENSITIVE<=4    ERTAPENEM Value in next row Sensitive      SENSITIVE<=0.5    * >=100,000 COLONIES/mL ESCHERICHIA COLI   Klebsiella pneumoniae - MIC*    AMPICILLIN Value in next row Resistant      SENSITIVE<=0.5    CEFAZOLIN Value in next row Sensitive      SENSITIVE<=0.5    CEFTRIAXONE Value in next row Sensitive      SENSITIVE<=0.5    CIPROFLOXACIN Value in next row Sensitive      SENSITIVE<=0.5    GENTAMICIN Value in next row Sensitive      SENSITIVE<=0.5    IMIPENEM Value in next row Sensitive      SENSITIVE<=0.5    NITROFURANTOIN Value in next row Intermediate      SENSITIVE<=0.5    TRIMETH/SULFA Value in next row Sensitive      SENSITIVE<=0.5    PIP/TAZO Value in next row Sensitive      SENSITIVE<=4    ERTAPENEM Value in next row Sensitive      SENSITIVE<=0.5    * 50,000 COLONIES/mL KLEBSIELLA PNEUMONIAE  Urine culture     Status: None   Collection Time: 01/29/15  7:00 AM  Result Value Ref Range Status   Specimen Description URINE, CLEAN CATCH  Final   Special Requests NONE  Final   Culture   Final     >=100,000 COLONIES/mL ESCHERICHIA COLI Results Called to: BRANDY MANSFIELD AT 1349 01/31/15 CTJ ESBL-EXTENDED SPECTRUM BETA LACTAMASE-THE ORGANISM IS RESISTANT TO PENICILLINS, CEPHALOSPORINS AND AZTREONAM ACCORDING TO CLSI M100-S15 VOL.[place].    Report Status 01/31/2015 FINAL  Final   Organism ID, Bacteria ESCHERICHIA COLI  Final      Susceptibility   Escherichia coli - MIC*    AMPICILLIN >=32 RESISTANT Resistant     CEFAZOLIN >=64 RESISTANT Resistant     CEFTRIAXONE >=64 RESISTANT Resistant     CIPROFLOXACIN >=4 RESISTANT Resistant     GENTAMICIN <=1 SENSITIVE Sensitive     IMIPENEM <=0.25 SENSITIVE Sensitive     NITROFURANTOIN <=16 SENSITIVE Sensitive     TRIMETH/SULFA >=320 RESISTANT Resistant     Extended ESBL POSITIVE Resistant     PIP/TAZO Value in next row Sensitive      SENSITIVE<=4    * >=100,000 COLONIES/mL ESCHERICHIA COLI    IMAGING: Dg Chest 2 View  02/01/2015  CLINICAL DATA:  Shortness of breath. EXAM: CHEST  2 VIEW COMPARISON:  11/29/2014 and 11/23/2014 FINDINGS: The patient has pulmonary vascular congestion with bilateral pulmonary edema and small to moderate bilateral pleural effusions. Calcification in the thoracic aorta. Pacemaker in place. No acute osseous abnormality. IMPRESSION: Findings consistent with congestive heart failure  with pulmonary vascular congestion, slight pulmonary edema, and small to moderate bilateral pleural effusions. Aortic atherosclerosis. Electronically Signed   By: Lorriane Shire M.D.   On: 02/01/2015 15:46   Dg Chest Port 1 View  02/01/2015  CLINICAL DATA:  PICC line placement.  Congestive heart failure. EXAM: PORTABLE CHEST 1 VIEW COMPARISON:  02/01/2015 FINDINGS: A new left arm PICC line is seen with the tip curled back on itself in the left brachiocephalic vein. Single lead transvenous pacemaker remains in appropriate position. Cardiomegaly is stable as well as diffuse interstitial edema pattern. Small bilateral pleural effusions  and left basilar atelectasis or consolidation are also unchanged. IMPRESSION: Abnormal left arm PICC line position, with tip curled back on itself in the left brachiocephalic vein. Stable congestive heart failure, with bilateral pleural effusions and left basilar atelectasis or consolidation. Electronically Signed   By: Earle Gell M.D.   On: 02/01/2015 18:40   Dg Chest Port 1 View  02/01/2015  CLINICAL DATA:  PICC line placement. EXAM: PORTABLE CHEST 1 VIEW COMPARISON:  02/01/2015 and prior radiograph FINDINGS: A left PICC line is noted with tip overlying the SVC/ left brachiocephalic junction. Cardiomegaly, pulmonary edema and bilateral lower lung atelectasis/ airspace disease again noted. Probable bilateral pleural effusions are present. There is no evidence pneumothorax. IMPRESSION: Left PICC line with tip overlying the SVC/ left brachiocephalic junction. Unchanged cardiomegaly, pulmonary edema and bilateral lower lung atelectasis/ airspace disease. Electronically Signed   By: Margarette Canada M.D.   On: 02/01/2015 18:39    Assessment:   Joyce Vasquez is a 81 y.o. female with a known history of hypertension, chronic congestive heart failure, Bell's policy, hyperlipidemia, chronic atrial fibrillation on Coumadin and multiple other medical problems admitted with hyponatremia. She had recently been dxed with a UTI as otpt with AMS sxs. Had started doxy but admitted based on low Na.  Since admission cx has been + for ESBL E coli and Klebsiella. UA on admission showed TNTC WBC.  Has been on levo since admit but started meropenem 10/11.  Currently less confused, no dysuria. No fevers chills since admit. WBC nml She is allergic to PCN, Cefuroxime and erythromycin.   Recommendations Continue meropenem while inpatient Can change at dc to oral macrobid 100 bid to which the ESBL E coli was sensitive a and Klebsiella intermediate.  Recent changes in the drug recommendations suggest this can be effective even in  elderly patients with reduced GFR down to 30.   For the Klebsiella would also give same duration bactrim ds bid  Would rec a 10 day course of abx from start date of the meropenem 10/11- stop date 10/21     Thank you very much for allowing me to participate in the care of this patient. Please call with questions.   Cheral Marker. Ola Spurr, MD

## 2015-02-01 NOTE — Care Management (Signed)
Informed during progression rounds that patient become confused and there is an increase in weakness since admission.  It is thought that this is due to current UTI and significant hyponatremia .  She had a PICC line inserted today and currently on 3% saline.  Md requested that physical therapy re evaluate patient to determine if there is a change in physical therapy recommendations.  MD is feeling that patient is going to require skilled nursing

## 2015-02-01 NOTE — Progress Notes (Signed)
PICC line is okay to use per Murvin Natal from the vascular access team. Verified with x-ray.

## 2015-02-02 ENCOUNTER — Inpatient Hospital Stay: Payer: Medicare Other

## 2015-02-02 LAB — SODIUM
SODIUM: 124 mmol/L — AB (ref 135–145)
SODIUM: 128 mmol/L — AB (ref 135–145)
SODIUM: 130 mmol/L — AB (ref 135–145)
Sodium: 121 mmol/L — ABNORMAL LOW (ref 135–145)
Sodium: 129 mmol/L — ABNORMAL LOW (ref 135–145)

## 2015-02-02 LAB — PROTIME-INR
INR: 2.12
PROTHROMBIN TIME: 23.9 s — AB (ref 11.4–15.0)

## 2015-02-02 MED ORDER — SODIUM CHLORIDE 0.9 % IV SOLN
500.0000 mg | INTRAVENOUS | Status: DC
  Administered 2015-02-02 – 2015-02-03 (×2): 500 mg via INTRAVENOUS
  Filled 2015-02-02 (×3): qty 0.5

## 2015-02-02 MED ORDER — WARFARIN SODIUM 4 MG PO TABS
4.0000 mg | ORAL_TABLET | Freq: Once | ORAL | Status: AC
  Administered 2015-02-02: 4 mg via ORAL
  Filled 2015-02-02: qty 1

## 2015-02-02 MED ORDER — WARFARIN SODIUM 3 MG PO TABS: 3.0000 mg | ORAL_TABLET | Freq: Once | ORAL | Status: DC

## 2015-02-02 MED ORDER — METOPROLOL SUCCINATE ER 25 MG PO TB24
25.0000 mg | ORAL_TABLET | Freq: Every day | ORAL | Status: DC
  Administered 2015-02-03 – 2015-02-04 (×2): 25 mg via ORAL
  Filled 2015-02-02 (×2): qty 1

## 2015-02-02 NOTE — Progress Notes (Signed)
[facility] NAME: Joyce Vasquez    MR#:  [id number]  DATE OF BIRTH:  December 14, 1916  SUBJECTIVE:  CHIEF COMPLAINT:   Chief Complaint  Patient presents with  . Dizziness   no complaint. Tolerated PT. BP was low at 80/58, repeat BP is 90/50.  REVIEW OF SYSTEMS:  CONSTITUTIONAL: No fever, no generalized weakness.  EYES: No blurred or double vision.  EARS, NOSE, AND THROAT: No tinnitus or ear pain.  RESPIRATORY: No cough, shortness of breath, wheezing or hemoptysis.  CARDIOVASCULAR: No chest pain, orthopnea, edema.  GASTROINTESTINAL: No nausea, vomiting, diarrhea or abdominal pain.  GENITOURINARY: No dysuria, hematuria.  ENDOCRINE: No polyuria, nocturia,  HEMATOLOGY: No anemia, easy bruising or bleeding SKIN: No rash or lesion. MUSCULOSKELETAL: No joint pain or arthritis.   NEUROLOGIC: No tingling, numbness, weakness.  PSYCHIATRY: No anxiety or depression.   DRUG ALLERGIES:   Allergies  Allergen Reactions  . Coreg [Carvedilol]   . Erythromycin Base Nausea And Vomiting  . Penicillins Swelling    "swelling of throat" Has patient had a PCN reaction causing immediate rash, facial/tongue/throat swelling, SOB or lightheadedness with hypotension: Yes Has patient had a PCN reaction causing severe rash involving mucus membranes or skin necrosis: No Has patient had a PCN reaction that required hospitalization No Has patient had a PCN reaction occurring within the last 10 years: No If all of the above answers are "NO", then may proceed with Cephalosporin use.  . Vitamin D Analogs   . Cefuroxime Axetil Nausea Only and Rash  . Morphine And Related Rash    VITALS:  Blood pressure 90/50, pulse 74, temperature 97.8 F (36.6 C), temperature source Oral, resp. rate 18, height 5\' 6"  (1.676 m), weight 78.926 kg (174 lb), SpO2 98 %.  PHYSICAL EXAMINATION:  GENERAL:  81 y.o.-year-old patient lying in the bed with no acute distress, but  looks tired.  EYES: Pupils equal, round, reactive to light and accommodation. No scleral icterus. Extraocular muscles intact.  HEENT: Head atraumatic, normocephalic. Oropharynx and nasopharynx clear. Moist oral mucosa. NECK:  Supple, no jugular venous distention. No thyroid enlargement, no tenderness.  LUNGS: Normal breath sounds bilaterally, no wheezing, no rales. No use of accessory muscles of respiration.  CARDIOVASCULAR: S1, S2 normal. No murmurs, rubs, or gallops.  ABDOMEN: Soft, nontender, nondistended. Bowel sounds present. No organomegaly or mass.  EXTREMITIES: No pedal edema, cyanosis, or clubbing.  NEUROLOGIC: Cranial nerves II through XII are intact. Muscle strength 3/5 in all extremities. Sensation intact. Gait not checked.  PSYCHIATRIC: The patient is alert and oriented x 3.  SKIN: No obvious rash, lesion, or ulcer.    LABORATORY PANEL:   CBC  Recent Labs Lab 02/01/15 0510  WBC 7.3  HGB 10.4*  HCT 31.1*  PLT 146*   ------------------------------------------------------------------------------------------------------------------  Chemistries   Recent Labs Lab 01/28/15 1556  01/31/15 0445  02/01/15 0510  02/02/15 1000  NA 120*  < > 120*  < > 118*  < > 124*  K 3.5  < > 3.4*  --  3.6  --   --   CL 86*  < > 87*  --  87*  --   --   CO2 25  < > 24  --  22  --   --   GLUCOSE 105*  < > 110*  --  118*  --   --   BUN 27*  < > 13  --  14  --   --  CREATININE 0.91  < > 0.66  --  0.63  --   --   CALCIUM 9.0  < > 8.7*  --  8.7*  --   --   MG  --   < > 1.7  --   --   --   --   AST 31  --   --   --   --   --   --   ALT 19  --   --   --   --   --   --   ALKPHOS 93  --   --   --   --   --   --   BILITOT 0.6  --   --   --   --   --   --   < > = values in this interval not displayed. ------------------------------------------------------------------------------------------------------------------  Cardiac Enzymes No results for input(s): TROPONINI in the last 168  hours. ------------------------------------------------------------------------------------------------------------------  RADIOLOGY:  Dg Chest 2 View  02/01/2015  CLINICAL DATA:  Shortness of breath. EXAM: CHEST  2 VIEW COMPARISON:  11/29/2014 and 11/23/2014 FINDINGS: The patient has pulmonary vascular congestion with bilateral pulmonary edema and small to moderate bilateral pleural effusions. Calcification in the thoracic aorta. Pacemaker in place. No acute osseous abnormality. IMPRESSION: Findings consistent with congestive heart failure with pulmonary vascular congestion, slight pulmonary edema, and small to moderate bilateral pleural effusions. Aortic atherosclerosis. Electronically Signed   By: Lorriane Shire M.D.   On: 02/01/2015 15:46   Dg Chest Port 1 View  02/02/2015  CLINICAL DATA:  PICC line placement EXAM: PORTABLE CHEST 1 VIEW COMPARISON:  02/01/2015 FINDINGS: Stable cardiac enlargement and tortuous, ectatic and calcified thoracic aorta. Persistent bilateral pleural effusions and bibasilar atelectasis. The left PICC line is folded back on itself in the left brachiocephalic vein. There could be a stenosis in the brachiocephalic vein due to the pacemaker. IMPRESSION: The left PICC line is folded back on itself in the mid brachiocephalic vein possibly due to a stenosis from the pacer wire. The tip of the catheter is probably near the left jugular vein confluence. Electronically Signed   By: Marijo Sanes M.D.   On: 02/02/2015 08:51   Dg Chest Port 1 View  02/01/2015  CLINICAL DATA:  PICC line placement.  Congestive heart failure. EXAM: PORTABLE CHEST 1 VIEW COMPARISON:  02/01/2015 FINDINGS: A new left arm PICC line is seen with the tip curled back on itself in the left brachiocephalic vein. Single lead transvenous pacemaker remains in appropriate position. Cardiomegaly is stable as well as diffuse interstitial edema pattern. Small bilateral pleural effusions and left basilar atelectasis or  consolidation are also unchanged. IMPRESSION: Abnormal left arm PICC line position, with tip curled back on itself in the left brachiocephalic vein. Stable congestive heart failure, with bilateral pleural effusions and left basilar atelectasis or consolidation. Electronically Signed   By: Earle Gell M.D.   On: 02/01/2015 18:40   Dg Chest Port 1 View  02/01/2015  CLINICAL DATA:  PICC line placement. EXAM: PORTABLE CHEST 1 VIEW COMPARISON:  02/01/2015 and prior radiograph FINDINGS: A left PICC line is noted with tip overlying the SVC/ left brachiocephalic junction. Cardiomegaly, pulmonary edema and bilateral lower lung atelectasis/ airspace disease again noted. Probable bilateral pleural effusions are present. There is no evidence pneumothorax. IMPRESSION: Left PICC line with tip overlying the SVC/ left brachiocephalic junction. Unchanged cardiomegaly, pulmonary edema and bilateral lower lung atelectasis/ airspace disease. Electronically Signed   By: Margarette Canada  M.D.   On: 02/01/2015 18:39    EKG:   Orders placed or performed during the hospital encounter of 01/28/15  . EKG 12-Lead  . EKG 12-Lead    ASSESSMENT AND PLAN:   1. Altered mental status secondary to hyponatremia/UTI. Mental status improved. Discontinued hydrochlorothiazide.   *  ESBL UTI, Recent history of acute cystitis, repeated urinalysis showed UTI,  urine culture shows ESBL E Coli. Discontinued IV levofloxacin and started meropenem. Per Dr. Ola Spurr, continue meropenem in house, change to macrobid after discharge until 10/21.   2. 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  Discontinued hydrochlorothiazide which was started recently during previous admission in August. Restrict by mouth fluids to 1800 mL Sodium level increased to 124 this am, continue 3% saline. Follow-up sodium level every 4 hours.   3. Hypertension. Continue metoprolol, hold benazepril and hydralazine.  4. History of chronic congestive  heart failure and sick sinus syndrome Stable. hold Lasix due to hyponatremia. CXR: no pulmonary edema.   5. Chronic history of atrial fibrillation Continue Lopressor and Coumadin management per pharmacy, follow-up INR.  *Hypomagnesemia, improved after IV magnesium. * Hypokalemia. Given potassium supplement and improved.  PT reevaluation suggested HHPT.  All the records are reviewed and case discussed with Care Management/Social Workerr. Management plans discussed with the patient, her daughter and they are in agreement.  I discussed with Dr. Holley Raring.  CODE STATUS: DO NOT RESUSCITATE  TOTAL TIME TAKING CARE OF THIS PATIENT: 37 minutes.   POSSIBLE D/C IN 2 DAYS, DEPENDING ON CLINICAL CONDITION.   Demetrios Loll M.D on 02/02/2015 at 3:43 PM  Between 7am to 6pm - Pager - [phone]  After 6pm go to [email or web address] - password [email or web address] [facility]  Office  [phone]  CC: Primary care physician; Chrisandra Carota, MD

## 2015-02-02 NOTE — Evaluation (Signed)
Physical Therapy Re-Evaluation Patient Details Name: Joyce Vasquez MRN: [id number] DOB: 06-Apr-1917 Today's Date: 02/02/2015   History of Present Illness  Pt admitted with AMS x2 weeks, low sodium, and UTI.  Pt evaluated and discharged by PT 01/29/15.  New consult received for re-eval.  PMH includes R CEA, pacemaker L 11/29/14, and B TKR.  Clinical Impression  Currently pt demonstrates impairments with strength, balance, and limitations with functional mobility.  Prior to admission, pt was modified independent with rollator.  Pt lives at [place].  Currently pt is CGA with transfers and ambulation 180 feet with rollator.  Pt required 4 attempts to stand with rollator but did well on 4th try with only CGA (pt's family member reports h/o knee issues causing difficulty with standing).  Pt did not have any loss of balance with ambulation with rollator or with turns R/L in room.  Pt appears somewhat weaker from prior evaluation but still doing fairly well with functional mobility using rollator; pt still with mild confusion during session.  Pt would benefit from skilled PT to address above noted impairments and functional limitations.  Recommend pt discharge home with HHPT when medically appropriate.     Follow Up Recommendations Home health PT    Equipment Recommendations   (pt already has 4ww)    Recommendations for Other Services       Precautions / Restrictions Precautions Precautions: Fall Restrictions Weight Bearing Restrictions: No      Mobility  Bed Mobility               General bed mobility comments: Not assessed d/t pt already sitting up in chair waiting for breakfast  Transfers Overall transfer level: Needs assistance Equipment used: 4-wheeled walker Transfers: Sit to/from Stand Sit to Stand: Min guard         General transfer comment: pt required 4 attempts to stand (pt's great granddaughter present and reports h/o difficulty standing d/t knee  issues) but did stand well on 4th attempt with only CGA  Ambulation/Gait Ambulation/Gait assistance: Min guard Ambulation Distance (Feet): 180 Feet Assistive device: 4-wheeled walker Gait Pattern/deviations: Step-through pattern Gait velocity: decreased (pt's granddaughter reports mildly decreased from baseline)   General Gait Details: decreased B step length/foot clearance/heelstrike but steady without any loss of balance; no significant fatigue noted  Stairs            Wheelchair Mobility    Modified Rankin (Stroke Patients Only)       Balance Overall balance assessment: Needs assistance Sitting-balance support: No upper extremity supported;Feet supported Sitting balance-Leahy Scale: Good     Standing balance support: Bilateral upper extremity supported (on RW) Standing balance-Leahy Scale: Good                               Pertinent Vitals/Pain Pain Assessment: No/denies pain  See flowsheet for HR vitals.    Home Living Family/patient expects to be discharged to::  ([facility])               Home Equipment: Walker - 4 wheels      Prior Function Level of Independence: Independent with assistive device(s)         Comments: Pt is independent with her rollator at [facility] where she often helps with a weekly exercise class.     Hand Dominance        Extremity/Trunk Assessment   Upper Extremity Assessment: Overall [facility]  for tasks assessed (L UE elevation not tested d/t recent pacemaker)           Lower Extremity Assessment: Generalized weakness         Communication   Communication: HOH  Cognition Arousal/Alertness: Awake/alert Behavior During Therapy: WFL for tasks assessed/performed Overall Cognitive Status: Impaired/Different from baseline (mild confusion noted; oriented to person, place, situation but reported it was October 9th 1914)                      General Comments   Nursing cleared  pt for participation in physical therapy.  Pt agreeable to PT session. Pt's great granddaughter present during session.    Exercises        Assessment/Plan    PT Assessment Patient needs continued PT services  PT Diagnosis Difficulty walking;Generalized weakness   PT Problem List Decreased strength;Decreased balance;Decreased mobility  PT Treatment Interventions DME instruction;Gait training;Functional mobility training;Therapeutic activities;Therapeutic exercise;Balance training;Patient/family education   PT Goals (Current goals can be found in the Care Plan section) Acute Rehab PT Goals Patient Stated Goal: go back to [facility] PT Goal Formulation: With patient Time For Goal Achievement: 02/16/15 Potential to Achieve Goals: Good    Frequency Min 2X/week   Barriers to discharge        Co-evaluation               End of Session Equipment Utilized During Treatment: Gait belt Activity Tolerance: Patient tolerated treatment well Patient left: in chair;with call bell/phone within reach;with chair alarm set;with family/visitor present Nurse Communication: Mobility status         Time: 9163-8466 PT Time Calculation (min) (ACUTE ONLY): 25 min   Charges:   PT Evaluation $PT Re-evaluation: 1 Procedure     PT G CodesLeitha Bleak 02/24/2015, 9:16 AM Leitha Bleak, [place]

## 2015-02-02 NOTE — Progress Notes (Signed)
Spoke with Dr Holley Raring regarding daughter's concern of Na going up 10 points.  Daughter stated that Dr Bridgett Larsson told family that it should not rise more than 7-8 points in 24 hour period.  Dr Holley Raring instructed to stop 3% saline at this time.   Call made to Dr Holley Raring per request of Dr Fritzi Mandes.  Lynnda Shields, RN

## 2015-02-02 NOTE — Care Management (Signed)
Physical therapy has re evaluated patient and continue to recommend that patient can return home with home health services.  Spoke with patient's daughter Epimenio Foot.  She says she does not have a choice of home health agency.  [place] has an office on the campus of [facility].  Discussed that there is a in home service agency , physical therapy- agency [employer]) and [place] [phone].  Left message at this agency to inquire about services that are provided.  Ms Charleston Ropes says if that agency can not provide services would like for [facility] to provide services.  She asks about whether home health can administer patient's meds.  Informed her that aides/companion aides can remind about meds but can not administer.  Discussed med planners and identify someone to fill and or assess for correct administration.  The home health nurse can check but not able to fill med planners and take on full responsibility of daily med administration

## 2015-02-02 NOTE — Progress Notes (Signed)
[facility]  ROUNDING NOTE   Subjective:  Pt sitting up in chair today. Family at bedside. Much more awake and alert. Oriented to self/place/time.  Na up to 124. Remains on 3% saline and has PICC in place.  Objective:  Vital signs in last 24 hours:  Temp:  [97.7 F (36.5 C)-97.8 F (36.6 C)] 97.8 F (36.6 C) (10/13 0855) Pulse Rate:  [61-74] 74 (10/13 0855) Resp:  [18-20] 18 (10/13 0855) BP: (80-182)/(49-71) 90/50 mmHg (10/13 1433) SpO2:  [98 %-99 %] 98 % (10/13 0855)  Weight change:  Filed Weights   01/28/15 1551  Weight: 78.926 kg (174 lb)    Intake/Output: I/O last 3 completed shifts: In: 590.8 [I.V.:540.8; IV Piggyback:50] Out: 100 [Urine:100]   Intake/Output this shift:     Physical Exam: General: NAD, sitting up in chair  Head: Normocephalic, atraumatic. Moist oral mucosal membranes  Eyes: Anicteric  Neck: Supple, trachea midline  Lungs:  CTAB  Heart: Regular rate and rhythm  Abdomen:  Soft, nontender, BS present  Extremities:  trace peripheral edema.  Neurologic: Awake, alert, oriented x 3 this AM, improved.  Skin: No lesions       Basic Metabolic Panel:  Recent Labs Lab 01/28/15 1556 01/29/15 0446 01/30/15 0403 01/31/15 0445  02/01/15 0510 02/01/15 0903 02/01/15 1331 02/01/15 1911 02/02/15 0518 02/02/15 1000  NA 120* 122* 121* 120*  < > 118* 118* 118* 119* 121* 124*  K 3.5 3.2* 3.6 3.4*  --  3.6  --   --   --   --   --   CL 86* 88* 90* 87*  --  87*  --   --   --   --   --   CO2 25 24 23 24   --  22  --   --   --   --   --   GLUCOSE 105* 99 114* 110*  --  118*  --   --   --   --   --   BUN 27* 22* 17 13  --  14  --   --   --   --   --   CREATININE 0.91 0.78 0.72 0.66  --  0.63  --   --   --   --   --   CALCIUM 9.0 8.7* 8.8* 8.7*  --  8.7*  --   --   --   --   --   MG  --  1.1* 1.6* 1.7  --   --   --   --   --   --   --   < > = values in this interval not displayed.  Liver Function Tests:  Recent Labs Lab 01/28/15 1556   AST 31  ALT 19  ALKPHOS 93  BILITOT 0.6  PROT 6.7  ALBUMIN 4.0   No results for input(s): LIPASE, AMYLASE in the last 168 hours. No results for input(s): AMMONIA in the last 168 hours.  CBC:  Recent Labs Lab 01/28/15 1556 02/01/15 0510  WBC 7.0 7.3  NEUTROABS  --  5.8  HGB 10.8* 10.4*  HCT 32.3* 31.1*  MCV 86.5 85.7  PLT 143* 146*    Cardiac Enzymes: No results for input(s): CKTOTAL, CKMB, CKMBINDEX, TROPONINI in the last 168 hours.  BNP: Invalid input(s): POCBNP  CBG: No results for input(s): GLUCAP in the last 168 hours.  Microbiology: Results for orders placed or performed during the hospital encounter of 01/28/15  MRSA PCR  Screening     Status: None   Collection Time: 01/28/15  3:40 PM  Result Value Ref Range Status   MRSA by PCR NEGATIVE NEGATIVE Final    Comment:        The GeneXpert MRSA Assay (FDA approved for NASAL specimens only), is one component of a comprehensive MRSA colonization surveillance program. It is not intended to diagnose MRSA infection nor to guide or monitor treatment for MRSA infections.   Urine culture     Status: None   Collection Time: 01/28/15  5:23 PM  Result Value Ref Range Status   Specimen Description URINE, CLEAN CATCH  Final   Special Requests NONE  Final   Culture   Final    50,000 COLONIES/mL KLEBSIELLA PNEUMONIAE >=100,000 COLONIES/mL ESCHERICHIA COLI Results Called to: DONNISHA ROBERTSON AT 1212 ON 10/102/16 CTJ ESBL-EXTENDED SPECTRUM BETA LACTAMASE-THE ORGANISM IS RESISTANT TO PENICILLINS, CEPHALOSPORINS AND AZTREONAM ACCORDING TO CLSI M100-S15 VOL.[place].    Report Status 02/01/2015 FINAL  Final   Organism ID, Bacteria KLEBSIELLA PNEUMONIAE  Final   Organism ID, Bacteria ESCHERICHIA COLI  Final      Susceptibility   Escherichia coli - MIC*    AMPICILLIN >=32 RESISTANT Resistant     CEFAZOLIN >=64 RESISTANT Resistant     CEFTRIAXONE >=64 RESISTANT Resistant     CIPROFLOXACIN >=4 RESISTANT  Resistant     GENTAMICIN <=1 SENSITIVE Sensitive     IMIPENEM <=0.25 SENSITIVE Sensitive     NITROFURANTOIN <=16 SENSITIVE Sensitive     TRIMETH/SULFA >=320 RESISTANT Resistant     Extended ESBL POSITIVE Resistant     PIP/TAZO Value in next row Sensitive      SENSITIVE<=4    ERTAPENEM Value in next row Sensitive      SENSITIVE<=0.5    * >=100,000 COLONIES/mL ESCHERICHIA COLI   Klebsiella pneumoniae - MIC*    AMPICILLIN Value in next row Resistant      SENSITIVE<=0.5    CEFAZOLIN Value in next row Sensitive      SENSITIVE<=0.5    CEFTRIAXONE Value in next row Sensitive      SENSITIVE<=0.5    CIPROFLOXACIN Value in next row Sensitive      SENSITIVE<=0.5    GENTAMICIN Value in next row Sensitive      SENSITIVE<=0.5    IMIPENEM Value in next row Sensitive      SENSITIVE<=0.5    NITROFURANTOIN Value in next row Intermediate      SENSITIVE<=0.5    TRIMETH/SULFA Value in next row Sensitive      SENSITIVE<=0.5    PIP/TAZO Value in next row Sensitive      SENSITIVE<=4    ERTAPENEM Value in next row Sensitive      SENSITIVE<=0.5    * 50,000 COLONIES/mL KLEBSIELLA PNEUMONIAE  Urine culture     Status: None   Collection Time: 01/29/15  7:00 AM  Result Value Ref Range Status   Specimen Description URINE, CLEAN CATCH  Final   Special Requests NONE  Final   Culture   Final    >=100,000 COLONIES/mL ESCHERICHIA COLI Results Called to: BRANDY MANSFIELD AT 1349 01/31/15 CTJ ESBL-EXTENDED SPECTRUM BETA LACTAMASE-THE ORGANISM IS RESISTANT TO PENICILLINS, CEPHALOSPORINS AND AZTREONAM ACCORDING TO CLSI M100-S15 VOL.[place].    Report Status 01/31/2015 FINAL  Final   Organism ID, Bacteria ESCHERICHIA COLI  Final      Susceptibility   Escherichia coli - MIC*    AMPICILLIN >=32 RESISTANT Resistant  CEFAZOLIN >=64 RESISTANT Resistant     CEFTRIAXONE >=64 RESISTANT Resistant     CIPROFLOXACIN >=4 RESISTANT Resistant     GENTAMICIN <=1 SENSITIVE Sensitive     IMIPENEM <=0.25  SENSITIVE Sensitive     NITROFURANTOIN <=16 SENSITIVE Sensitive     TRIMETH/SULFA >=320 RESISTANT Resistant     Extended ESBL POSITIVE Resistant     PIP/TAZO Value in next row Sensitive      SENSITIVE<=4    * >=100,000 COLONIES/mL ESCHERICHIA COLI    Coagulation Studies:  Recent Labs  01/31/15 0445 02/01/15 0510 02/02/15 0518  LABPROT 20.4* 20.8* 23.9*  INR 1.73 1.77 2.12    Urinalysis: No results for input(s): COLORURINE, LABSPEC, PHURINE, GLUCOSEU, HGBUR, BILIRUBINUR, KETONESUR, PROTEINUR, UROBILINOGEN, NITRITE, LEUKOCYTESUR in the last 72 hours.  Invalid input(s): APPERANCEUR    Imaging: Dg Chest 2 View  02/01/2015  CLINICAL DATA:  Shortness of breath. EXAM: CHEST  2 VIEW COMPARISON:  11/29/2014 and 11/23/2014 FINDINGS: The patient has pulmonary vascular congestion with bilateral pulmonary edema and small to moderate bilateral pleural effusions. Calcification in the thoracic aorta. Pacemaker in place. No acute osseous abnormality. IMPRESSION: Findings consistent with congestive heart failure with pulmonary vascular congestion, slight pulmonary edema, and small to moderate bilateral pleural effusions. Aortic atherosclerosis. Electronically Signed   By: Lorriane Shire M.D.   On: 02/01/2015 15:46   Dg Chest Port 1 View  02/02/2015  CLINICAL DATA:  PICC line placement EXAM: PORTABLE CHEST 1 VIEW COMPARISON:  02/01/2015 FINDINGS: Stable cardiac enlargement and tortuous, ectatic and calcified thoracic aorta. Persistent bilateral pleural effusions and bibasilar atelectasis. The left PICC line is folded back on itself in the left brachiocephalic vein. There could be a stenosis in the brachiocephalic vein due to the pacemaker. IMPRESSION: The left PICC line is folded back on itself in the mid brachiocephalic vein possibly due to a stenosis from the pacer wire. The tip of the catheter is probably near the left jugular vein confluence. Electronically Signed   By: Marijo Sanes M.D.   On:  02/02/2015 08:51   Dg Chest Port 1 View  02/01/2015  CLINICAL DATA:  PICC line placement.  Congestive heart failure. EXAM: PORTABLE CHEST 1 VIEW COMPARISON:  02/01/2015 FINDINGS: A new left arm PICC line is seen with the tip curled back on itself in the left brachiocephalic vein. Single lead transvenous pacemaker remains in appropriate position. Cardiomegaly is stable as well as diffuse interstitial edema pattern. Small bilateral pleural effusions and left basilar atelectasis or consolidation are also unchanged. IMPRESSION: Abnormal left arm PICC line position, with tip curled back on itself in the left brachiocephalic vein. Stable congestive heart failure, with bilateral pleural effusions and left basilar atelectasis or consolidation. Electronically Signed   By: Earle Gell M.D.   On: 02/01/2015 18:40   Dg Chest Port 1 View  02/01/2015  CLINICAL DATA:  PICC line placement. EXAM: PORTABLE CHEST 1 VIEW COMPARISON:  02/01/2015 and prior radiograph FINDINGS: A left PICC line is noted with tip overlying the SVC/ left brachiocephalic junction. Cardiomegaly, pulmonary edema and bilateral lower lung atelectasis/ airspace disease again noted. Probable bilateral pleural effusions are present. There is no evidence pneumothorax. IMPRESSION: Left PICC line with tip overlying the SVC/ left brachiocephalic junction. Unchanged cardiomegaly, pulmonary edema and bilateral lower lung atelectasis/ airspace disease. Electronically Signed   By: Margarette Canada M.D.   On: 02/01/2015 18:39     Medications:   . sodium chloride (hypertonic) 40 mL/hr (02/02/15 0959)   . aspirin  162.5 mg Oral Daily  . docusate sodium  100 mg Oral Daily  . latanoprost  1 drop Both Eyes QHS  . meropenem (MERREM) IV  500 mg Intravenous Q24H  . [START ON 02/03/2015] metoprolol succinate  25 mg Oral Daily  . oxybutynin  5 mg Oral QHS  . pantoprazole  40 mg Oral Daily  . sodium chloride  1 g Oral TID WC  . vitamin B-12  1,000 mcg Oral Daily  .  warfarin  4 mg Oral ONCE-1800  . Warfarin - Pharmacist Dosing Inpatient   Does not apply q1800   acetaminophen, diphenhydrAMINE, hydrALAZINE, nitroGLYCERIN, ondansetron **OR** ondansetron (ZOFRAN) IV, promethazine  Assessment/ Plan:  81 y.o. female with a PMHx of hypertension, osteoarthritis, hyperlipidemia, temporal arteritis, atrial fibrillation, GERD, congestive heart failure, glaucoma, history of TIA, Bell's palsy, who was admitted to [facility] on 01/28/2015 for evaluation of altered mental status.  1. Hyponatremia. 2. Hypokalemia. 3. E. Coli UTI. 4. Altered mental status.   Plan:  Overall patient much improved today in terms of mental status.  Much more interactive.  Family also at bedside.  Continue 3% saline via PICC.  Na up to 124.  Would continue Na checks as ordered.  Treatment of E. Coli per hospitalist.  Making satisfactory progress at present, will follow with you.   LOS: 5 Joyce Vasquez 10/13/20164:54 PM

## 2015-02-02 NOTE — Progress Notes (Signed)
ANTIBIOTIC CONSULT NOTE - FOLLOW UP  Pharmacy Consult for meropenem Indication: ESBL positive UTI  Allergies  Allergen Reactions  . Coreg [Carvedilol]   . Erythromycin Base Nausea And Vomiting  . Penicillins Swelling    "swelling of throat" Has patient had a PCN reaction causing immediate rash, facial/tongue/throat swelling, SOB or lightheadedness with hypotension: Yes Has patient had a PCN reaction causing severe rash involving mucus membranes or skin necrosis: No Has patient had a PCN reaction that required hospitalization No Has patient had a PCN reaction occurring within the last 10 years: No If all of the above answers are "NO", then may proceed with Cephalosporin use.  . Vitamin D Analogs   . Cefuroxime Axetil Nausea Only and Rash  . Morphine And Related Rash    Patient Measurements: Height: 5\' 6"  (167.6 cm) Weight: 174 lb (78.926 kg) IBW/kg (Calculated) : 59.3  Vital Signs: Temp: 97.8 F (36.6 C) (10/13 0855) Temp Source: Oral (10/13 0855) BP: 162/70 mmHg (10/13 0855) Pulse Rate: 74 (10/13 0855) Intake/Output from previous day: 10/12 0701 - 10/13 0700 In: 340.8 [I.V.:290.8; IV Piggyback:50] Out: 100 [Urine:100] Intake/Output from this shift:    Labs:  Recent Labs  01/31/15 0445 02/01/15 0510  WBC  --  7.3  HGB  --  10.4*  PLT  --  146*  CREATININE 0.66 0.63   Estimated Creatinine Clearance: 44.6 mL/min (by C-G formula based on Cr of 0.63).   Microbiology: Recent Results (from the past 720 hour(s))  MRSA PCR Screening     Status: None   Collection Time: 01/28/15  3:40 PM  Result Value Ref Range Status   MRSA by PCR NEGATIVE NEGATIVE Final    Comment:        The GeneXpert MRSA Assay (FDA approved for NASAL specimens only), is one component of a comprehensive MRSA colonization surveillance program. It is not intended to diagnose MRSA infection nor to guide or monitor treatment for MRSA infections.   Urine culture     Status: None   Collection  Time: 01/28/15  5:23 PM  Result Value Ref Range Status   Specimen Description URINE, CLEAN CATCH  Final   Special Requests NONE  Final   Culture   Final    50,000 COLONIES/mL KLEBSIELLA PNEUMONIAE >=100,000 COLONIES/mL ESCHERICHIA COLI Results Called to: DONNISHA ROBERTSON AT 1212 ON 10/102/16 CTJ ESBL-EXTENDED SPECTRUM BETA LACTAMASE-THE ORGANISM IS RESISTANT TO PENICILLINS, CEPHALOSPORINS AND AZTREONAM ACCORDING TO CLSI M100-S15 VOL.[place].    Report Status 02/01/2015 FINAL  Final   Organism ID, Bacteria KLEBSIELLA PNEUMONIAE  Final   Organism ID, Bacteria ESCHERICHIA COLI  Final      Susceptibility   Escherichia coli - MIC*    AMPICILLIN >=32 RESISTANT Resistant     CEFAZOLIN >=64 RESISTANT Resistant     CEFTRIAXONE >=64 RESISTANT Resistant     CIPROFLOXACIN >=4 RESISTANT Resistant     GENTAMICIN <=1 SENSITIVE Sensitive     IMIPENEM <=0.25 SENSITIVE Sensitive     NITROFURANTOIN <=16 SENSITIVE Sensitive     TRIMETH/SULFA >=320 RESISTANT Resistant     Extended ESBL POSITIVE Resistant     PIP/TAZO Value in next row Sensitive      SENSITIVE<=4    ERTAPENEM Value in next row Sensitive      SENSITIVE<=0.5    * >=100,000 COLONIES/mL ESCHERICHIA COLI   Klebsiella pneumoniae - MIC*    AMPICILLIN Value in next row Resistant      SENSITIVE<=0.5    CEFAZOLIN Value in  next row Sensitive      SENSITIVE<=0.5    CEFTRIAXONE Value in next row Sensitive      SENSITIVE<=0.5    CIPROFLOXACIN Value in next row Sensitive      SENSITIVE<=0.5    GENTAMICIN Value in next row Sensitive      SENSITIVE<=0.5    IMIPENEM Value in next row Sensitive      SENSITIVE<=0.5    NITROFURANTOIN Value in next row Intermediate      SENSITIVE<=0.5    TRIMETH/SULFA Value in next row Sensitive      SENSITIVE<=0.5    PIP/TAZO Value in next row Sensitive      SENSITIVE<=4    ERTAPENEM Value in next row Sensitive      SENSITIVE<=0.5    * 50,000 COLONIES/mL KLEBSIELLA PNEUMONIAE  Urine culture      Status: None   Collection Time: 01/29/15  7:00 AM  Result Value Ref Range Status   Specimen Description URINE, CLEAN CATCH  Final   Special Requests NONE  Final   Culture   Final    >=100,000 COLONIES/mL ESCHERICHIA COLI Results Called to: BRANDY MANSFIELD AT 1349 01/31/15 CTJ ESBL-EXTENDED SPECTRUM BETA LACTAMASE-THE ORGANISM IS RESISTANT TO PENICILLINS, CEPHALOSPORINS AND AZTREONAM ACCORDING TO CLSI M100-S15 VOL.[place].    Report Status 01/31/2015 FINAL  Final   Organism ID, Bacteria ESCHERICHIA COLI  Final      Susceptibility   Escherichia coli - MIC*    AMPICILLIN >=32 RESISTANT Resistant     CEFAZOLIN >=64 RESISTANT Resistant     CEFTRIAXONE >=64 RESISTANT Resistant     CIPROFLOXACIN >=4 RESISTANT Resistant     GENTAMICIN <=1 SENSITIVE Sensitive     IMIPENEM <=0.25 SENSITIVE Sensitive     NITROFURANTOIN <=16 SENSITIVE Sensitive     TRIMETH/SULFA >=320 RESISTANT Resistant     Extended ESBL POSITIVE Resistant     PIP/TAZO Value in next row Sensitive      SENSITIVE<=4    * >=100,000 COLONIES/mL ESCHERICHIA COLI    Anti-infectives    Start     Dose/Rate Route Frequency Ordered Stop   02/02/15 2000  meropenem (MERREM) 500 mg in sodium chloride 0.9 % 50 mL IVPB     500 mg 100 mL/hr over 30 Minutes Intravenous Every 24 hours 02/02/15 1119     01/31/15 1630  meropenem (MERREM) 500 mg in sodium chloride 0.9 % 50 mL IVPB  Status:  Discontinued     500 mg 100 mL/hr over 30 Minutes Intravenous Every 12 hours 01/31/15 1520 02/02/15 1116   01/31/15 1530  meropenem (MERREM) 500 mg in sodium chloride 0.9 % 50 mL IVPB  Status:  Discontinued     500 mg 100 mL/hr over 30 Minutes Intravenous Every 12 hours 01/31/15 1524 01/31/15 1526   01/29/15 0600  Levofloxacin (LEVAQUIN) IVPB 250 mg  Status:  Discontinued     250 mg 50 mL/hr over 60 Minutes Intravenous Every 24 hours 01/29/15 0548 01/31/15 1503   01/28/15 2200  doxycycline (VIBRA-TABS) tablet 100 mg  Status:  Discontinued      100 mg Oral 2 times daily 01/28/15 2046 01/30/15 1324      Assessment: Pharmacy dosing meropenem in this 81 year old female for ESBL-positive E.coli UTI. Cultures also growing Klebsiella which is sensitive to meropenem. Today is day #3 of meropenem, ID recommends antibiotic treatment through 10/21.  Patient was recently started on HD.  Plan:  Change meropenem dose to 500 mg IV daily to be given  after HD on days the patient receives HD. Pharmacy will continue to monitor and adjust dose as needed.   Joyce Vasquez 02/02/2015,11:20 AM

## 2015-02-02 NOTE — Progress Notes (Signed)
ANTICOAGULATION CONSULT NOTE - Follow Up Consult  Pharmacy Consult for warfarin Indication: atrial fibrillation   Vital Signs: Temp: 97.8 F (36.6 C) (10/13 0855) Temp Source: Oral (10/13 0855) BP: 94/49 mmHg (10/13 1345) Pulse Rate: 74 (10/13 0855)  Labs:  Recent Labs  01/31/15 0445 02/01/15 0510 02/02/15 0518  HGB  --  10.4*  --   HCT  --  31.1*  --   PLT  --  146*  --   LABPROT 20.4* 20.8* 23.9*  INR 1.73 1.77 2.12  CREATININE 0.66 0.63  --     Estimated Creatinine Clearance: 44.6 mL/min (by C-G formula based on Cr of 0.63).   Assessment: Pharmacy dosing warfarin in this 81 year old female for atrial fibrillation. Pt was on warfarin prior to admission with a home regimen of 3 mg warfarin daily except 2mg  on Wednesdays.   INR is therapeutic today at 2.12, which is an increase from yesterdays INR of 1.77. I believe this is most likely reflective of the drug interaction resulting from levofloxacin doses that she received from 10/19 - 10/11. I don't believe this is reflective of the dose increase yesterday since it takes ~3 days to see changes in INR resulting from dose changes.   Goal of Therapy:  INR 2-3 Monitor platelets by anticoagulation protocol: Yes   Plan:  Will give another dose of warfarin 4mg  this evening and continue to follow INR.  Darylene Price Felesia Stahlecker 02/02/2015,2:17 PM

## 2015-02-03 LAB — CBC WITH DIFFERENTIAL/PLATELET
Basophils Absolute: 0 10*3/uL (ref 0–0.1)
Basophils Relative: 0 %
EOS ABS: 0.1 10*3/uL (ref 0–0.7)
EOS PCT: 1 %
HCT: 29.8 % — ABNORMAL LOW (ref 35.0–47.0)
Hemoglobin: 9.8 g/dL — ABNORMAL LOW (ref 12.0–16.0)
LYMPHS ABS: 0.8 10*3/uL — AB (ref 1.0–3.6)
LYMPHS PCT: 7 %
MCH: 28.6 pg (ref 26.0–34.0)
MCHC: 33 g/dL (ref 32.0–36.0)
MCV: 86.5 fL (ref 80.0–100.0)
MONOS PCT: 5 %
Monocytes Absolute: 0.6 10*3/uL (ref 0.2–0.9)
Neutro Abs: 11 10*3/uL — ABNORMAL HIGH (ref 1.4–6.5)
Neutrophils Relative %: 87 %
PLATELETS: 136 10*3/uL — AB (ref 150–440)
RBC: 3.44 MIL/uL — AB (ref 3.80–5.20)
RDW: 15 % — ABNORMAL HIGH (ref 11.5–14.5)
WBC: 12.5 10*3/uL — ABNORMAL HIGH (ref 3.6–11.0)

## 2015-02-03 LAB — PROTEIN ELECTRO, RANDOM URINE
ALPHA-2-GLOBULIN, U: 12.1 %
Albumin ELP, Urine: 47 %
Alpha-1-Globulin, U: 2.3 %
BETA GLOBULIN, U: 28.6 %
GAMMA GLOBULIN, U: 9.9 %
Total Protein, Urine: 31.2 mg/dL

## 2015-02-03 LAB — SODIUM
SODIUM: 130 mmol/L — AB (ref 135–145)
SODIUM: 130 mmol/L — AB (ref 135–145)
Sodium: 131 mmol/L — ABNORMAL LOW (ref 135–145)
Sodium: 131 mmol/L — ABNORMAL LOW (ref 135–145)
Sodium: 129 mmol/L — ABNORMAL LOW (ref 135–145)

## 2015-02-03 LAB — PROTIME-INR
INR: 3.61
Prothrombin Time: 36 seconds — ABNORMAL HIGH (ref 11.4–15.0)

## 2015-02-03 MED ORDER — HYDRALAZINE HCL 25 MG PO TABS
25.0000 mg | ORAL_TABLET | Freq: Three times a day (TID) | ORAL | Status: DC
  Administered 2015-02-03 – 2015-02-04 (×3): 25 mg via ORAL
  Filled 2015-02-03 (×3): qty 1

## 2015-02-03 MED ORDER — SODIUM CHLORIDE 0.9 % IJ SOLN
10.0000 mL | Freq: Two times a day (BID) | INTRAMUSCULAR | Status: DC
  Administered 2015-02-03 – 2015-02-04 (×3): 10 mL via INTRAVENOUS

## 2015-02-03 MED ORDER — BENAZEPRIL HCL 20 MG PO TABS
20.0000 mg | ORAL_TABLET | Freq: Every day | ORAL | Status: DC
  Administered 2015-02-03 – 2015-02-04 (×2): 20 mg via ORAL
  Filled 2015-02-03 (×2): qty 1

## 2015-02-03 MED ORDER — ESTROGENS, CONJUGATED 0.625 MG/GM VA CREA
1.0000 | TOPICAL_CREAM | Freq: Every day | VAGINAL | Status: DC
  Administered 2015-02-03 – 2015-02-04 (×2): 1 via VAGINAL
  Filled 2015-02-03: qty 30

## 2015-02-03 NOTE — Progress Notes (Signed)
[place] [place] NAME: Joyce Vasquez    MR#:  [id number]  DATE OF BIRTH:  27-May-1916  SUBJECTIVE:  CHIEF COMPLAINT:   Chief Complaint  Patient presents with  . Dizziness   no complaint. Na 130 today, off 3% NS.  REVIEW OF SYSTEMS:  CONSTITUTIONAL: No fever, no generalized weakness.  EYES: No blurred or double vision.  EARS, NOSE, AND THROAT: No tinnitus or ear pain.  RESPIRATORY: No cough, shortness of breath, wheezing or hemoptysis.  CARDIOVASCULAR: No chest pain, orthopnea, edema.  GASTROINTESTINAL: No nausea, vomiting, diarrhea or abdominal pain.  GENITOURINARY: No dysuria, hematuria.  ENDOCRINE: No polyuria, nocturia,  HEMATOLOGY: No anemia, easy bruising or bleeding SKIN: No rash or lesion. MUSCULOSKELETAL: No joint pain or arthritis.   NEUROLOGIC: No tingling, numbness, weakness.  PSYCHIATRY: No anxiety or depression.   DRUG ALLERGIES:   Allergies  Allergen Reactions  . Coreg [Carvedilol]   . Erythromycin Base Nausea And Vomiting  . Penicillins Swelling    "swelling of throat" Has patient had a PCN reaction causing immediate rash, facial/tongue/throat swelling, SOB or lightheadedness with hypotension: Yes Has patient had a PCN reaction causing severe rash involving mucus membranes or skin necrosis: No Has patient had a PCN reaction that required hospitalization No Has patient had a PCN reaction occurring within the last 10 years: No If all of the above answers are "NO", then may proceed with Cephalosporin use.  . Vitamin D Analogs   . Cefuroxime Axetil Nausea Only and Rash  . Morphine And Related Rash    VITALS:  Blood pressure 168/65, pulse 66, temperature 98.4 F (36.9 C), temperature source Oral, resp. rate 19, height 5\' 6"  (1.676 m), weight 78.926 kg (174 lb), SpO2 98 %.  PHYSICAL EXAMINATION:  GENERAL:  81 y.o.-year-old patient lying in the bed with no acute distress, but looks tired.  EYES: Pupils  equal, round, reactive to light and accommodation. No scleral icterus. Extraocular muscles intact.  HEENT: Head atraumatic, normocephalic. Oropharynx and nasopharynx clear. Moist oral mucosa. NECK:  Supple, no jugular venous distention. No thyroid enlargement, no tenderness.  LUNGS: Normal breath sounds bilaterally, no wheezing, no rales. No use of accessory muscles of respiration.  CARDIOVASCULAR: S1, S2 normal. No murmurs, rubs, or gallops.  ABDOMEN: Soft, nontender, nondistended. Bowel sounds present. No organomegaly or mass.  EXTREMITIES: No pedal edema, cyanosis, or clubbing.  NEUROLOGIC: Cranial nerves II through XII are intact. Muscle strength 5/5 in all extremities. Sensation intact. Gait not checked.  PSYCHIATRIC: The patient is alert and oriented x 3.  SKIN: No obvious rash, lesion, or ulcer.    LABORATORY PANEL:   CBC  Recent Labs Lab 02/03/15 0637  WBC 12.5*  HGB 9.8*  HCT 29.8*  PLT 136*   ------------------------------------------------------------------------------------------------------------------  Chemistries   Recent Labs Lab 01/28/15 1556  01/31/15 0445  02/01/15 0510  02/03/15 1223  NA 120*  < > 120*  < > 118*  < > 131*  K 3.5  < > 3.4*  --  3.6  --   --   CL 86*  < > 87*  --  87*  --   --   CO2 25  < > 24  --  22  --   --   GLUCOSE 105*  < > 110*  --  118*  --   --   BUN 27*  < > 13  --  14  --   --   CREATININE  0.91  < > 0.66  --  0.63  --   --   CALCIUM 9.0  < > 8.7*  --  8.7*  --   --   MG  --   < > 1.7  --   --   --   --   AST 31  --   --   --   --   --   --   ALT 19  --   --   --   --   --   --   ALKPHOS 93  --   --   --   --   --   --   BILITOT 0.6  --   --   --   --   --   --   < > = values in this interval not displayed. ------------------------------------------------------------------------------------------------------------------  Cardiac Enzymes No results for input(s): TROPONINI in the last 168  hours. ------------------------------------------------------------------------------------------------------------------  RADIOLOGY:  Dg Chest 2 View  02/01/2015  CLINICAL DATA:  Shortness of breath. EXAM: CHEST  2 VIEW COMPARISON:  11/29/2014 and 11/23/2014 FINDINGS: The patient has pulmonary vascular congestion with bilateral pulmonary edema and small to moderate bilateral pleural effusions. Calcification in the thoracic aorta. Pacemaker in place. No acute osseous abnormality. IMPRESSION: Findings consistent with congestive heart failure with pulmonary vascular congestion, slight pulmonary edema, and small to moderate bilateral pleural effusions. Aortic atherosclerosis. Electronically Signed   By: Lorriane Shire M.D.   On: 02/01/2015 15:46   Dg Chest Port 1 View  02/02/2015  CLINICAL DATA:  PICC line placement EXAM: PORTABLE CHEST 1 VIEW COMPARISON:  02/01/2015 FINDINGS: Stable cardiac enlargement and tortuous, ectatic and calcified thoracic aorta. Persistent bilateral pleural effusions and bibasilar atelectasis. The left PICC line is folded back on itself in the left brachiocephalic vein. There could be a stenosis in the brachiocephalic vein due to the pacemaker. IMPRESSION: The left PICC line is folded back on itself in the mid brachiocephalic vein possibly due to a stenosis from the pacer wire. The tip of the catheter is probably near the left jugular vein confluence. Electronically Signed   By: Marijo Sanes M.D.   On: 02/02/2015 08:51   Dg Chest Port 1 View  02/01/2015  CLINICAL DATA:  PICC line placement.  Congestive heart failure. EXAM: PORTABLE CHEST 1 VIEW COMPARISON:  02/01/2015 FINDINGS: A new left arm PICC line is seen with the tip curled back on itself in the left brachiocephalic vein. Single lead transvenous pacemaker remains in appropriate position. Cardiomegaly is stable as well as diffuse interstitial edema pattern. Small bilateral pleural effusions and left basilar atelectasis or  consolidation are also unchanged. IMPRESSION: Abnormal left arm PICC line position, with tip curled back on itself in the left brachiocephalic vein. Stable congestive heart failure, with bilateral pleural effusions and left basilar atelectasis or consolidation. Electronically Signed   By: Earle Gell M.D.   On: 02/01/2015 18:40   Dg Chest Port 1 View  02/01/2015  CLINICAL DATA:  PICC line placement. EXAM: PORTABLE CHEST 1 VIEW COMPARISON:  02/01/2015 and prior radiograph FINDINGS: A left PICC line is noted with tip overlying the SVC/ left brachiocephalic junction. Cardiomegaly, pulmonary edema and bilateral lower lung atelectasis/ airspace disease again noted. Probable bilateral pleural effusions are present. There is no evidence pneumothorax. IMPRESSION: Left PICC line with tip overlying the SVC/ left brachiocephalic junction. Unchanged cardiomegaly, pulmonary edema and bilateral lower lung atelectasis/ airspace disease. Electronically Signed   By: Cleatis Polka.D.  On: 02/01/2015 18:39    EKG:   Orders placed or performed during the hospital encounter of 01/28/15  . EKG 12-Lead  . EKG 12-Lead    ASSESSMENT AND PLAN:   1. Altered mental status secondary to hyponatremia/UTI. Mental status improved. Discontinued hydrochlorothiazide.   *  ESBL UTI, Recent history of acute cystitis, repeated urinalysis showed UTI,  urine culture shows ESBL E Coli. Discontinued IV levofloxacin and started meropenem. Per Dr. Ola Spurr, continue meropenem in house, change to macrobid after discharge until 10/21.   2. 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  Discontinued hydrochlorothiazide which was started recently during previous admission in August. Restrict by mouth fluids to 1800 mL Sodium level increased to 131 this am, discontinued 3% saline last night. Follow-up sodium level in am.  3. Hypertension. Continue metoprolol, benazepril and hydralazine.  4. History of chronic congestive  heart failure and sick sinus syndrome Stable. hold Lasix due to hyponatremia. CXR: no pulmonary edema.   5. Chronic history of atrial fibrillation Continue Lopressor and Coumadin management per pharmacy, follow-up INR.  *Hypomagnesemia, improved after IV magnesium. * Hypokalemia. Given potassium supplement and improved.  PT reevaluation suggested HHPT.  All the records are reviewed and case discussed with Care Management/Social Workerr. Management plans discussed with the patient, her daughter and they are in agreement.  I discussed with Dr. Holley Raring.  CODE STATUS: DO NOT RESUSCITATE  TOTAL TIME TAKING CARE OF THIS PATIENT: 35  minutes.   POSSIBLE D/C IN 1-2 DAYS, DEPENDING ON CLINICAL CONDITION.   Demetrios Loll M.D on 02/03/2015 at 1:37 PM  Between 7am to 6pm - Pager - [phone]  After 6pm go to [email or web address] - password [email or web address] [facility]  Office  [phone]  CC: Primary care physician; Chrisandra Carota, MD

## 2015-02-03 NOTE — Progress Notes (Signed)
Called MD to clarify order for conjugated estrogen vaginal cream. Per Dr. Ola Spurr apply a same amount to the tip of the urethra.

## 2015-02-03 NOTE — Progress Notes (Signed)
[facility]  ROUNDING NOTE   Subjective:  Sitting up and eating breakfast. Na up to 130. 3% saline was stopped last night.  Objective:  Vital signs in last 24 hours:  Temp:  [97.4 F (36.3 C)-97.9 F (36.6 C)] 97.9 F (36.6 C) (10/14 0450) Pulse Rate:  [59-61] 60 (10/14 1034) Resp:  [16] 16 (10/14 0450) BP: (80-166)/(43-74) 156/43 mmHg (10/14 1034) SpO2:  [97 %] 97 % (10/14 0450)  Weight change:  Filed Weights   01/28/15 1551  Weight: 78.926 kg (174 lb)    Intake/Output: I/O last 3 completed shifts: In: 400.7 [I.V.:400.7] Out: 0    Intake/Output this shift:  Total I/O In: 10 [I.V.:10] Out: 0   Physical Exam: General: NAD, sitting up in chair  Head: Normocephalic, atraumatic. Moist oral mucosal membranes  Eyes: Anicteric  Neck: Supple, trachea midline  Lungs:  CTAB  Heart: Regular rate and rhythm  Abdomen:  Soft, nontender, BS present  Extremities:  trace peripheral edema.  Neurologic: Awake, alert, oriented x 3 this AM, follows commands  Skin: No lesions       Basic Metabolic Panel:  Recent Labs Lab 01/28/15 1556 01/29/15 0446 01/30/15 0403 01/31/15 0445  02/01/15 0510  02/02/15 1420 02/02/15 1745 02/02/15 2115 02/03/15 0115 02/03/15 0637  NA 120* 122* 121* 120*  < > 118*  < > 128* 129* 130* 130* 130*  K 3.5 3.2* 3.6 3.4*  --  3.6  --   --   --   --   --   --   CL 86* 88* 90* 87*  --  87*  --   --   --   --   --   --   CO2 25 24 23 24   --  22  --   --   --   --   --   --   GLUCOSE 105* 99 114* 110*  --  118*  --   --   --   --   --   --   BUN 27* 22* 17 13  --  14  --   --   --   --   --   --   CREATININE 0.91 0.78 0.72 0.66  --  0.63  --   --   --   --   --   --   CALCIUM 9.0 8.7* 8.8* 8.7*  --  8.7*  --   --   --   --   --   --   MG  --  1.1* 1.6* 1.7  --   --   --   --   --   --   --   --   < > = values in this interval not displayed.  Liver Function Tests:  Recent Labs Lab 01/28/15 1556  AST 31  ALT 19  ALKPHOS 93   BILITOT 0.6  PROT 6.7  ALBUMIN 4.0   No results for input(s): LIPASE, AMYLASE in the last 168 hours. No results for input(s): AMMONIA in the last 168 hours.  CBC:  Recent Labs Lab 01/28/15 1556 02/01/15 0510 02/03/15 0637  WBC 7.0 7.3 12.5*  NEUTROABS  --  5.8 11.0*  HGB 10.8* 10.4* 9.8*  HCT 32.3* 31.1* 29.8*  MCV 86.5 85.7 86.5  PLT 143* 146* 136*    Cardiac Enzymes: No results for input(s): CKTOTAL, CKMB, CKMBINDEX, TROPONINI in the last 168 hours.  BNP: Invalid input(s): POCBNP  CBG: No results for  input(s): GLUCAP in the last 168 hours.  Microbiology: Results for orders placed or performed during the hospital encounter of 01/28/15  MRSA PCR Screening     Status: None   Collection Time: 01/28/15  3:40 PM  Result Value Ref Range Status   MRSA by PCR NEGATIVE NEGATIVE Final    Comment:        The GeneXpert MRSA Assay (FDA approved for NASAL specimens only), is one component of a comprehensive MRSA colonization surveillance program. It is not intended to diagnose MRSA infection nor to guide or monitor treatment for MRSA infections.   Urine culture     Status: None   Collection Time: 01/28/15  5:23 PM  Result Value Ref Range Status   Specimen Description URINE, CLEAN CATCH  Final   Special Requests NONE  Final   Culture   Final    50,000 COLONIES/mL KLEBSIELLA PNEUMONIAE >=100,000 COLONIES/mL ESCHERICHIA COLI Results Called to: DONNISHA ROBERTSON AT 1212 ON 10/102/16 CTJ ESBL-EXTENDED SPECTRUM BETA LACTAMASE-THE ORGANISM IS RESISTANT TO PENICILLINS, CEPHALOSPORINS AND AZTREONAM ACCORDING TO CLSI M100-S15 VOL.[place].    Report Status 02/01/2015 FINAL  Final   Organism ID, Bacteria KLEBSIELLA PNEUMONIAE  Final   Organism ID, Bacteria ESCHERICHIA COLI  Final      Susceptibility   Escherichia coli - MIC*    AMPICILLIN >=32 RESISTANT Resistant     CEFAZOLIN >=64 RESISTANT Resistant     CEFTRIAXONE >=64 RESISTANT Resistant     CIPROFLOXACIN >=4  RESISTANT Resistant     GENTAMICIN <=1 SENSITIVE Sensitive     IMIPENEM <=0.25 SENSITIVE Sensitive     NITROFURANTOIN <=16 SENSITIVE Sensitive     TRIMETH/SULFA >=320 RESISTANT Resistant     Extended ESBL POSITIVE Resistant     PIP/TAZO Value in next row Sensitive      SENSITIVE<=4    ERTAPENEM Value in next row Sensitive      SENSITIVE<=0.5    * >=100,000 COLONIES/mL ESCHERICHIA COLI   Klebsiella pneumoniae - MIC*    AMPICILLIN Value in next row Resistant      SENSITIVE<=0.5    CEFAZOLIN Value in next row Sensitive      SENSITIVE<=0.5    CEFTRIAXONE Value in next row Sensitive      SENSITIVE<=0.5    CIPROFLOXACIN Value in next row Sensitive      SENSITIVE<=0.5    GENTAMICIN Value in next row Sensitive      SENSITIVE<=0.5    IMIPENEM Value in next row Sensitive      SENSITIVE<=0.5    NITROFURANTOIN Value in next row Intermediate      SENSITIVE<=0.5    TRIMETH/SULFA Value in next row Sensitive      SENSITIVE<=0.5    PIP/TAZO Value in next row Sensitive      SENSITIVE<=4    ERTAPENEM Value in next row Sensitive      SENSITIVE<=0.5    * 50,000 COLONIES/mL KLEBSIELLA PNEUMONIAE  Urine culture     Status: None   Collection Time: 01/29/15  7:00 AM  Result Value Ref Range Status   Specimen Description URINE, CLEAN CATCH  Final   Special Requests NONE  Final   Culture   Final    >=100,000 COLONIES/mL ESCHERICHIA COLI Results Called to: BRANDY MANSFIELD AT 1349 01/31/15 CTJ ESBL-EXTENDED SPECTRUM BETA LACTAMASE-THE ORGANISM IS RESISTANT TO PENICILLINS, CEPHALOSPORINS AND AZTREONAM ACCORDING TO CLSI M100-S15 VOL.[place].    Report Status 01/31/2015 FINAL  Final   Organism ID, Bacteria ESCHERICHIA COLI  Final      Susceptibility   Escherichia coli - MIC*    AMPICILLIN >=32 RESISTANT Resistant     CEFAZOLIN >=64 RESISTANT Resistant     CEFTRIAXONE >=64 RESISTANT Resistant     CIPROFLOXACIN >=4 RESISTANT Resistant     GENTAMICIN <=1 SENSITIVE Sensitive     IMIPENEM  <=0.25 SENSITIVE Sensitive     NITROFURANTOIN <=16 SENSITIVE Sensitive     TRIMETH/SULFA >=320 RESISTANT Resistant     Extended ESBL POSITIVE Resistant     PIP/TAZO Value in next row Sensitive      SENSITIVE<=4    * >=100,000 COLONIES/mL ESCHERICHIA COLI    Coagulation Studies:  Recent Labs  02/01/15 0510 02/02/15 0518 02/03/15 0637  LABPROT 20.8* 23.9* 36.0*  INR 1.77 2.12 3.61    Urinalysis: No results for input(s): COLORURINE, LABSPEC, PHURINE, GLUCOSEU, HGBUR, BILIRUBINUR, KETONESUR, PROTEINUR, UROBILINOGEN, NITRITE, LEUKOCYTESUR in the last 72 hours.  Invalid input(s): APPERANCEUR    Imaging: Dg Chest 2 View  02/01/2015  CLINICAL DATA:  Shortness of breath. EXAM: CHEST  2 VIEW COMPARISON:  11/29/2014 and 11/23/2014 FINDINGS: The patient has pulmonary vascular congestion with bilateral pulmonary edema and small to moderate bilateral pleural effusions. Calcification in the thoracic aorta. Pacemaker in place. No acute osseous abnormality. IMPRESSION: Findings consistent with congestive heart failure with pulmonary vascular congestion, slight pulmonary edema, and small to moderate bilateral pleural effusions. Aortic atherosclerosis. Electronically Signed   By: Lorriane Shire M.D.   On: 02/01/2015 15:46   Dg Chest Port 1 View  02/02/2015  CLINICAL DATA:  PICC line placement EXAM: PORTABLE CHEST 1 VIEW COMPARISON:  02/01/2015 FINDINGS: Stable cardiac enlargement and tortuous, ectatic and calcified thoracic aorta. Persistent bilateral pleural effusions and bibasilar atelectasis. The left PICC line is folded back on itself in the left brachiocephalic vein. There could be a stenosis in the brachiocephalic vein due to the pacemaker. IMPRESSION: The left PICC line is folded back on itself in the mid brachiocephalic vein possibly due to a stenosis from the pacer wire. The tip of the catheter is probably near the left jugular vein confluence. Electronically Signed   By: Marijo Sanes M.D.    On: 02/02/2015 08:51   Dg Chest Port 1 View  02/01/2015  CLINICAL DATA:  PICC line placement.  Congestive heart failure. EXAM: PORTABLE CHEST 1 VIEW COMPARISON:  02/01/2015 FINDINGS: A new left arm PICC line is seen with the tip curled back on itself in the left brachiocephalic vein. Single lead transvenous pacemaker remains in appropriate position. Cardiomegaly is stable as well as diffuse interstitial edema pattern. Small bilateral pleural effusions and left basilar atelectasis or consolidation are also unchanged. IMPRESSION: Abnormal left arm PICC line position, with tip curled back on itself in the left brachiocephalic vein. Stable congestive heart failure, with bilateral pleural effusions and left basilar atelectasis or consolidation. Electronically Signed   By: Earle Gell M.D.   On: 02/01/2015 18:40   Dg Chest Port 1 View  02/01/2015  CLINICAL DATA:  PICC line placement. EXAM: PORTABLE CHEST 1 VIEW COMPARISON:  02/01/2015 and prior radiograph FINDINGS: A left PICC line is noted with tip overlying the SVC/ left brachiocephalic junction. Cardiomegaly, pulmonary edema and bilateral lower lung atelectasis/ airspace disease again noted. Probable bilateral pleural effusions are present. There is no evidence pneumothorax. IMPRESSION: Left PICC line with tip overlying the SVC/ left brachiocephalic junction. Unchanged cardiomegaly, pulmonary edema and bilateral lower lung atelectasis/ airspace disease. Electronically Signed   By: Cleatis Polka.D.  On: 02/01/2015 18:39     Medications:     . aspirin  162.5 mg Oral Daily  . docusate sodium  100 mg Oral Daily  . latanoprost  1 drop Both Eyes QHS  . meropenem (MERREM) IV  500 mg Intravenous Q24H  . metoprolol succinate  25 mg Oral Daily  . oxybutynin  5 mg Oral QHS  . pantoprazole  40 mg Oral Daily  . sodium chloride  10 mL Intravenous Q12H  . sodium chloride  1 g Oral TID WC  . vitamin B-12  1,000 mcg Oral Daily  . Warfarin - Pharmacist Dosing  Inpatient   Does not apply q1800   acetaminophen, diphenhydrAMINE, hydrALAZINE, nitroGLYCERIN, ondansetron **OR** ondansetron (ZOFRAN) IV, promethazine  Assessment/ Plan:  81 y.o. female with a PMHx of hypertension, osteoarthritis, hyperlipidemia, temporal arteritis, atrial fibrillation, GERD, congestive heart failure, glaucoma, history of TIA, Bell's palsy, who was admitted to [facility] on 01/28/2015 for evaluation of altered mental status.  1. Hyponatremia. 2. Hypokalemia. 3. E. Coli UTI. 4. Altered mental status.   Plan:  Has improved with improvement in Na.  We have stopped 3% saline.  It appears her baseline Na is in the low 130s.  Would elect to observe at this point in time.  Continue treatment of UTI per hospitalist/ID.  Mental status significantly improved.  No further renal input at this time.  Will sign off, please call with further questions.    LOS: 6 Praneel Haisley 10/14/201611:40 AM

## 2015-02-03 NOTE — Progress Notes (Signed)
ANTICOAGULATION CONSULT NOTE - Follow Up Consult  Pharmacy Consult for warfarin Indication: atrial fibrillation  Labs:  Recent Labs  02/01/15 0510 02/02/15 0518 02/03/15 0637  HGB 10.4*  --  9.8*  HCT 31.1*  --  29.8*  PLT 146*  --  136*  LABPROT 20.8* 23.9* 36.0*  INR 1.77 2.12 3.61  CREATININE 0.63  --   --    Assessment: INR supratherapeutic today at 3.61.  Significant increase from yesterdays INR of 2.12, most likely attributed to a delayed drug interaction with the levofloxacin doses received from 10/9-10/11. It often takes ~3-4 days to see INR changes resulting from dose changes and some drug interactions. I anticipate that INR may remain elevated tomorrow as well since dose was recently increased after patient had a subtherapeutic INR.  Patient's home regimen prior to admission was warfarin 3mg  PO daily with the exception of taking 2 mg on Wednesdays.   Dosing over the past few days: Date:  INR:  Dose:     Interacting meds: 10/10  1.86  3 mg        Levofloxacin 10/11  1.73  3 mg     Levofloxacin 10/12  1.77  4 mg     No major 10/13  2.12  4 mg     No major 10/14  3.61  HOLD     No major  Goal of Therapy:  INR 2-3 Monitor platelets by anticoagulation protocol: Yes   Plan:  Hold warfarin today and follow up with INR in the morning.  Darylene Price Jesten Cappuccio 02/03/2015,1:33 PM

## 2015-02-03 NOTE — Care Management (Signed)
3% saline has been stopped.  Sodium today is 131.  If remains stable may anticipate discharge within 24 hours.  Unable to reach [place] that is located on the campus of [facility].  [place].  Referral called and faxed via epic.  Requested [facility] on 10/16- if patient discharges 10/15.  Requesting SN PT Aide and SW- for long range planning.

## 2015-02-03 NOTE — Progress Notes (Signed)
[place] INFECTIOUS DISEASE PROGRESS NOTE Date of Admission:  01/28/2015     ID: Joyce Vasquez is a 81 y.o. female with ESBL E coli UTI, Hypnatremia  Active Problems:   Hyponatremia   Subjective: Feels a little stronger. No dyrusia, no fevers, abd pain  ROS  Eleven systems are reviewed and negative except per hpi  Medications:  Antibiotics Given (last 72 hours)    Date/Time Action Medication Dose Rate   01/31/15 1723 Given   meropenem (MERREM) 500 mg in sodium chloride 0.9 % 50 mL IVPB 500 mg 100 mL/hr   02/01/15 0825 Given   meropenem (MERREM) 500 mg in sodium chloride 0.9 % 50 mL IVPB 500 mg 100 mL/hr   02/01/15 2124 Given   meropenem (MERREM) 500 mg in sodium chloride 0.9 % 50 mL IVPB 500 mg 100 mL/hr   02/02/15 2440 Given   meropenem (MERREM) 500 mg in sodium chloride 0.9 % 50 mL IVPB 500 mg 100 mL/hr   02/02/15 2139 Given   meropenem (MERREM) 500 mg in sodium chloride 0.9 % 50 mL IVPB 500 mg 100 mL/hr     . aspirin  162.5 mg Oral Daily  . benazepril  20 mg Oral Daily  . docusate sodium  100 mg Oral Daily  . hydrALAZINE  25 mg Oral 3 times per day  . latanoprost  1 drop Both Eyes QHS  . meropenem (MERREM) IV  500 mg Intravenous Q24H  . metoprolol succinate  25 mg Oral Daily  . oxybutynin  5 mg Oral QHS  . pantoprazole  40 mg Oral Daily  . sodium chloride  10 mL Intravenous Q12H  . sodium chloride  1 g Oral TID WC  . vitamin B-12  1,000 mcg Oral Daily  . Warfarin - Pharmacist Dosing Inpatient   Does not apply q1800    Objective: Vital signs in last 24 hours: Temp:  [97.4 F (36.3 C)-98.4 F (36.9 C)] 98.4 F (36.9 C) (10/14 1242) Pulse Rate:  [59-66] 66 (10/14 1242) Resp:  [16-19] 19 (10/14 1242) BP: (122-168)/(43-74) 168/65 mmHg (10/14 1242) SpO2:  [97 %-98 %] 98 % (10/14 1242) Constitutional: Quiet, lying in med.Frail appears well-developed and well-nourished. No distress.  HENT: [place]/AT, PERRLA, no scleral icterus Mouth/Throat: Oropharynx is clear  and moist. No oropharyngeal exudate.  Cardiovascular: Normal rate, regular rhythm and normal heart sounds. Exam reveals no gallop and no friction rub.  No murmur heard.  Pulmonary/Chest: Effort normal and breath sounds normal. No respiratory distress. has no wheezes.  Neck = supple, no nuchal rigidity Abdominal: Soft. Bowel sounds are normal. exhibits no distension. There is no tenderness.  Lymphadenopathy: no cervical adenopathy. No axillary adenopathy Neurological: alert and oriented to person, place, and time.  Skin: Skin is warm and dry. No rash noted. No erythema.  Psychiatric: a normal mood and affect. behavior is normal.  Lab Results  Recent Labs  02/01/15 0510  02/03/15 0637 02/03/15 1223  WBC 7.3  --  12.5*  --   HGB 10.4*  --  9.8*  --   HCT 31.1*  --  29.8*  --   NA 118*  < > 130* 131*  K 3.6  --   --   --   CL 87*  --   --   --   CO2 22  --   --   --   BUN 14  --   --   --   CREATININE 0.63  --   --   --   < > =  values in this interval not displayed.  Microbiology: Results for orders placed or performed during the hospital encounter of 01/28/15  MRSA PCR Screening     Status: None   Collection Time: 01/28/15  3:40 PM  Result Value Ref Range Status   MRSA by PCR NEGATIVE NEGATIVE Final    Comment:        The GeneXpert MRSA Assay (FDA approved for NASAL specimens only), is one component of a comprehensive MRSA colonization surveillance program. It is not intended to diagnose MRSA infection nor to guide or monitor treatment for MRSA infections.   Urine culture     Status: None   Collection Time: 01/28/15  5:23 PM  Result Value Ref Range Status   Specimen Description URINE, CLEAN CATCH  Final   Special Requests NONE  Final   Culture   Final    50,000 COLONIES/mL KLEBSIELLA PNEUMONIAE >=100,000 COLONIES/mL ESCHERICHIA COLI Results Called to: DONNISHA ROBERTSON AT 1212 ON 10/102/16 [facility] ESBL-EXTENDED SPECTRUM BETA LACTAMASE-THE ORGANISM IS RESISTANT  TO PENICILLINS, CEPHALOSPORINS AND AZTREONAM ACCORDING TO CLSI M100-S15 VOL.[place].    Report Status 02/01/2015 FINAL  Final   Organism ID, Bacteria KLEBSIELLA PNEUMONIAE  Final   Organism ID, Bacteria ESCHERICHIA COLI  Final      Susceptibility   Escherichia coli - MIC*    AMPICILLIN >=32 RESISTANT Resistant     CEFAZOLIN >=64 RESISTANT Resistant     CEFTRIAXONE >=64 RESISTANT Resistant     CIPROFLOXACIN >=4 RESISTANT Resistant     GENTAMICIN <=1 SENSITIVE Sensitive     IMIPENEM <=0.25 SENSITIVE Sensitive     NITROFURANTOIN <=16 SENSITIVE Sensitive     TRIMETH/SULFA >=320 RESISTANT Resistant     Extended ESBL POSITIVE Resistant     PIP/TAZO Value in next row Sensitive      SENSITIVE<=4    ERTAPENEM Value in next row Sensitive      SENSITIVE<=0.5    * >=100,000 COLONIES/mL ESCHERICHIA COLI   Klebsiella pneumoniae - MIC*    AMPICILLIN Value in next row Resistant      SENSITIVE<=0.5    CEFAZOLIN Value in next row Sensitive      SENSITIVE<=0.5    CEFTRIAXONE Value in next row Sensitive      SENSITIVE<=0.5    CIPROFLOXACIN Value in next row Sensitive      SENSITIVE<=0.5    GENTAMICIN Value in next row Sensitive      SENSITIVE<=0.5    IMIPENEM Value in next row Sensitive      SENSITIVE<=0.5    NITROFURANTOIN Value in next row Intermediate      SENSITIVE<=0.5    TRIMETH/SULFA Value in next row Sensitive      SENSITIVE<=0.5    PIP/TAZO Value in next row Sensitive      SENSITIVE<=4    ERTAPENEM Value in next row Sensitive      SENSITIVE<=0.5    * 50,000 COLONIES/mL KLEBSIELLA PNEUMONIAE  Urine culture     Status: None   Collection Time: 01/29/15  7:00 AM  Result Value Ref Range Status   Specimen Description URINE, CLEAN CATCH  Final   Special Requests NONE  Final   Culture   Final    >=100,000 COLONIES/mL ESCHERICHIA COLI Results Called to: BRANDY MANSFIELD AT 1349 01/31/15 CTJ ESBL-EXTENDED SPECTRUM BETA LACTAMASE-THE ORGANISM IS RESISTANT TO PENICILLINS,  CEPHALOSPORINS AND AZTREONAM ACCORDING TO CLSI M100-S15 VOL.[place].    Report Status 01/31/2015 FINAL  Final   Organism ID, Bacteria ESCHERICHIA COLI  Final      Susceptibility   Escherichia coli - MIC*    AMPICILLIN >=32 RESISTANT Resistant     CEFAZOLIN >=64 RESISTANT Resistant     CEFTRIAXONE >=64 RESISTANT Resistant     CIPROFLOXACIN >=4 RESISTANT Resistant     GENTAMICIN <=1 SENSITIVE Sensitive     IMIPENEM <=0.25 SENSITIVE Sensitive     NITROFURANTOIN <=16 SENSITIVE Sensitive     TRIMETH/SULFA >=320 RESISTANT Resistant     Extended ESBL POSITIVE Resistant     PIP/TAZO Value in next row Sensitive      SENSITIVE<=4    * >=100,000 COLONIES/mL ESCHERICHIA COLI    Studies/Results: Dg Chest 2 View  02/01/2015  CLINICAL DATA:  Shortness of breath. EXAM: CHEST  2 VIEW COMPARISON:  11/29/2014 and 11/23/2014 FINDINGS: The patient has pulmonary vascular congestion with bilateral pulmonary edema and small to moderate bilateral pleural effusions. Calcification in the thoracic aorta. Pacemaker in place. No acute osseous abnormality. IMPRESSION: Findings consistent with congestive heart failure with pulmonary vascular congestion, slight pulmonary edema, and small to moderate bilateral pleural effusions. Aortic atherosclerosis. Electronically Signed   By: Lorriane Shire M.D.   On: 02/01/2015 15:46   Dg Chest Port 1 View  02/02/2015  CLINICAL DATA:  PICC line placement EXAM: PORTABLE CHEST 1 VIEW COMPARISON:  02/01/2015 FINDINGS: Stable cardiac enlargement and tortuous, ectatic and calcified thoracic aorta. Persistent bilateral pleural effusions and bibasilar atelectasis. The left PICC line is folded back on itself in the left brachiocephalic vein. There could be a stenosis in the brachiocephalic vein due to the pacemaker. IMPRESSION: The left PICC line is folded back on itself in the mid brachiocephalic vein possibly due to a stenosis from the pacer wire. The tip of the catheter is probably  near the left jugular vein confluence. Electronically Signed   By: Marijo Sanes M.D.   On: 02/02/2015 08:51   Dg Chest Port 1 View  02/01/2015  CLINICAL DATA:  PICC line placement.  Congestive heart failure. EXAM: PORTABLE CHEST 1 VIEW COMPARISON:  02/01/2015 FINDINGS: A new left arm PICC line is seen with the tip curled back on itself in the left brachiocephalic vein. Single lead transvenous pacemaker remains in appropriate position. Cardiomegaly is stable as well as diffuse interstitial edema pattern. Small bilateral pleural effusions and left basilar atelectasis or consolidation are also unchanged. IMPRESSION: Abnormal left arm PICC line position, with tip curled back on itself in the left brachiocephalic vein. Stable congestive heart failure, with bilateral pleural effusions and left basilar atelectasis or consolidation. Electronically Signed   By: Earle Gell M.D.   On: 02/01/2015 18:40   Dg Chest Port 1 View  02/01/2015  CLINICAL DATA:  PICC line placement. EXAM: PORTABLE CHEST 1 VIEW COMPARISON:  02/01/2015 and prior radiograph FINDINGS: A left PICC line is noted with tip overlying the SVC/ left brachiocephalic junction. Cardiomegaly, pulmonary edema and bilateral lower lung atelectasis/ airspace disease again noted. Probable bilateral pleural effusions are present. There is no evidence pneumothorax. IMPRESSION: Left PICC line with tip overlying the SVC/ left brachiocephalic junction. Unchanged cardiomegaly, pulmonary edema and bilateral lower lung atelectasis/ airspace disease. Electronically Signed   By: Margarette Canada M.D.   On: 02/01/2015 18:39    Assessment/Plan: AANIKA DEFOOR is a 81 y.o. female with a known history of hypertension, chronic congestive heart failure, [employer] policy, hyperlipidemia, chronic atrial fibrillation on Coumadin and multiple other medical problems admitted with hyponatremia. She had recently been dxed with a UTI as otpt with  [facility] sxs. Had started doxy but admitted based  on low Na. Since admission cx has been + for ESBL E coli and Klebsiella. UA on admission showed TNTC WBC.  Has been on levo since admit but started meropenem 10/11. Currently less confused, no dysuria. No fevers chills since admit. WBC nml She is allergic to PCN, Cefuroxime and erythromycin.  She has had multiple UTIs over last year  Recommendations Continue meropenem while inpatient Can change at dc to oral macrobid 100 bid to which the ESBL E coli was sensitive and Klebsiella intermediate. (Recent changes in the drug recommendations suggest this can be effective even in elderly patients with reduced GFR down to 30.)  For the Klebsiella would also give same duration bactrim ds bid   DURATION - Would rec a 10 day course of abx from start date of the meropenem 10/11- stop date 10/21    Prevention of recurrence - Following abx course would rec macrobid 100 qd for suppression for 3 months I have started vaginal estrogen cream to urtheral tip qd for 2 weeks then 3x week after that -should continue as otpt Cranberry supplements also suggested If recurs needs to see urology as otpt. Thank you very much for the consult. Will follow with you.  Charlen Bakula   02/03/2015, 2:33 PM

## 2015-02-03 NOTE — Progress Notes (Signed)
Patient ambulated in hall twice so far this shift with assistance from nursing staff and her own rolling walker. Once helped to a standing position, patient was pretty independent. Tolerated well. No complaints. Up in chair for most of the day so she could 'work on my leg exercises' - family at bedside today.

## 2015-02-03 NOTE — Progress Notes (Signed)
Per Raquel Sarna PT, patient is ok to walk in hall with walker - just needs to be gowned/gloved due to contact precautions.

## 2015-02-03 NOTE — Progress Notes (Signed)
Pt. Complained of loose stool yesterday, per nurse tech stool yesterday was more formed than loose and patient had been receiving stool softeners. Dr. Bridgett Larsson aware - MD does not want to test for c.diff at this time. Per MD, do not give today's stool softener. Will continue to monitor.

## 2015-02-04 LAB — SODIUM
SODIUM: 129 mmol/L — AB (ref 135–145)
SODIUM: 131 mmol/L — AB (ref 135–145)

## 2015-02-04 LAB — PROTIME-INR
INR: 3.24
PROTHROMBIN TIME: 33.1 s — AB (ref 11.4–15.0)

## 2015-02-04 MED ORDER — NITROFURANTOIN MONOHYD MACRO 100 MG PO CAPS: 100.0000 mg | ORAL_CAPSULE | Freq: Two times a day (BID) | ORAL | Status: DC

## 2015-02-04 MED ORDER — BENAZEPRIL HCL 20 MG PO TABS: 20.0000 mg | ORAL_TABLET | Freq: Every day | ORAL | Status: AC

## 2015-02-04 MED ORDER — ESTROGENS, CONJUGATED 0.625 MG/GM VA CREA: 1.0000 | TOPICAL_CREAM | Freq: Every day | VAGINAL | Status: AC

## 2015-02-04 NOTE — Discharge Instructions (Signed)
[facility]------HOME HEALTH NURSE, PHYSICAL THERAPY AIDE (IF NEEDED) AND SOCIAL WORK FOR LONG RANGE PLANNING .  IF DISCHARGE 10/15, AGENCY WILL SEE 10/16 (SUNDAY)  CALL [phone]  Heart healthy diet. Activity as tolerated. Hold coumadin for 2 days, f/u INR this Monday. Cranberry supplements Fall precaution. HHPT.

## 2015-02-04 NOTE — Progress Notes (Signed)
Patient given discharge teaching and paperwork regarding medications, diet, follow-up appointments and activity. Patient understanding verbalized. No complaints at this time. IV and telemetry discontinued prior to leaving. Skin assessment as previously charted and vitals are stable; on room air. Patient being discharged to home. Daughter  present during discharge teaching. No further needs by Care Management. Prescriptions given to patient/daughter.  Toniann Ket

## 2015-02-04 NOTE — Progress Notes (Signed)
ANTICOAGULATION CONSULT NOTE - Follow Up Consult  Pharmacy Consult for warfarin Indication: atrial fibrillation  Labs:  Recent Labs  02/02/15 0518 02/03/15 0637 02/04/15 0606  HGB  --  9.8*  --   HCT  --  29.8*  --   PLT  --  136*  --   LABPROT 23.9* 36.0* 33.1*  INR 2.12 3.61 3.24   Assessment: INR supratherapeutic today at 3.24.  10/14: Significant increase from yesterdays INR of 2.12, most likely attributed to a delayed drug interaction with the levofloxacin doses received from 10/9-10/11. It often takes ~3-4 days to see INR changes resulting from dose changes and some drug interactions. I anticipate that INR may remain elevated tomorrow as well since dose was recently increased after patient had a subtherapeutic INR.  Patient's home regimen prior to admission was warfarin 3mg  PO daily with the exception of taking 2 mg on Wednesdays.   Dosing over the past few days: Date:  INR:  Dose:     Interacting meds: 10/10  1.86  3 mg        Levofloxacin 10/11  1.73  3 mg     Levofloxacin 10/12  1.77  4 mg     No major 10/13  2.12  4 mg     No major 10/14  3.61  HOLD     No major 10/15  3.24  HOLD      Meropenem  Goal of Therapy:  INR 2-3 Monitor platelets by anticoagulation protocol: Yes   Plan:  10/15: Hold warfarin again today and follow up with INR in the morning.  Chinita Greenland PharmD Clinical Pharmacist 02/04/2015 8:11 AM

## 2015-02-04 NOTE — Progress Notes (Signed)
Per Care Manager, Jeani Hawking, patient is ready to be discharged.

## 2015-02-04 NOTE — Care Management Note (Signed)
Case Management Note  Patient Details  Name: Joyce Vasquez MRN: [id number] Date of Birth: 07/04/1916  Subjective/Objective:    Pharmacy recommending INR on Sunday morning. Waiting for MD order for this if [name] is discharged today. Also waiting for discharging MD to write an IV meropenum order and prescription.                 Action/Plan:   Expected Discharge Date:                  Expected Discharge Plan:     In-House Referral:     Discharge planning Services     Post Acute Care Choice:    Choice offered to:     DME Arranged:    DME Agency:     HH Arranged:    [place] Agency:     Status of Service:     Medicare Important Message Given:  Yes-second notification given Date Medicare IM Given:    Medicare IM give by:    Date Additional Medicare IM Given:    Additional Medicare Important Message give by:     If discussed at [place] of Stay Meetings, dates discussed:    Additional Comments:  Kalisa Girtman A, RN 02/04/2015, 9:36 AM

## 2015-02-04 NOTE — Progress Notes (Signed)
Spoke with Dr. Bridgett Larsson regarding patient discharge. Order to remove PICC line prior to discharge. MD aware of pharmacist's note regarding INR - patient is to hold coumadin Saturday and Sunday. Ok to proceed with discharge whenever Care Manager is ready.

## 2015-02-04 NOTE — Discharge Summary (Signed)
[place] at [facility] NAME: Joyce Vasquez    MR#:  [id number]  DATE OF BIRTH:  [birth year]  DATE OF ADMISSION:  01/28/2015 ADMITTING PHYSICIAN: Nicholes Mango, MD  DATE OF DISCHARGE: 02/04/2015  1:18 PM  PRIMARY CARE PHYSICIAN: Chrisandra Carota, MD    ADMISSION DIAGNOSIS:  Acute hyponatremia [E87.1] Other fatigue [R53.83]   DISCHARGE DIAGNOSIS:    SECONDARY DIAGNOSIS:   Past Medical History  Diagnosis Date  . Hypertension   . Arthritis     osteoarthritis  . Hyperlipidemia   . Temporal giant cell arteritis (HCC)     left eye  . Atrial fibrillation ([place])   . GERD (gastroesophageal reflux disease)   . CHF (congestive heart failure) ([place])   . Glaucoma   . DJD (degenerative joint disease)   . TIA (transient ischemic attack)   . Bell's palsy   . Cancer ([place])     skin    HOSPITAL COURSE:   1. Altered mental status secondary to hyponatremia/UTI. Mental status improved. Discontinued hydrochlorothiazide.   * ESBL UTI, Recent history of acute cystitis, repeated urinalysis showed UTI, urine culture shows ESBL E Coli. Discontinued IV levofloxacin and started meropenem. Per Dr. Ola Spurr, continue meropenem in house, change to macrobid after discharge until 10/21. Macrobid 100 qd for suppression for 3 months, Cranberry supplements also suggested. Vaginal estrogen cream to urtheral tip qd for 2 weeks then 3x week after that.    2. 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  Discontinued hydrochlorothiazide which was started recently during previous admission in August. Restrict by mouth fluids to 1800 mL The patient was treated with 3% saline which was discontinued after sodium level increased to 130.  Sodium level increased to 131 this am. Follow-up sodium level as outpatient.  3. Hypertension. Continue metoprolol, benazepril and hydralazine.  4. History of chronic congestive heart failure and sick sinus  syndrome Stable. hold Lasix due to hyponatremia. CXR: no pulmonary edema.   5. Chronic history of atrial fibrillation Treated with Lopressor and Coumadin management per pharmacy. INR is 3.24 today. Hold Coumadin for 2 days and follow-up INR on Monday.  *Hypomagnesemia, improved after IV magnesium. * Hypokalemia. Given potassium supplement and improved.  PT reevaluation suggested HHPT.   DISCHARGE CONDITIONS:   Stable, discharged to home with home PT and health.  CONSULTS OBTAINED:  Treatment Team:  Nicholes Mango, MD Anthonette Legato, MD Adrian Prows, MD  DRUG ALLERGIES:   Allergies  Allergen Reactions  . Coreg [Carvedilol]   . Erythromycin Base Nausea And Vomiting  . Penicillins Swelling    "swelling of throat" Has patient had a PCN reaction causing immediate rash, facial/tongue/throat swelling, SOB or lightheadedness with hypotension: Yes Has patient had a PCN reaction causing severe rash involving mucus membranes or skin necrosis: No Has patient had a PCN reaction that required hospitalization No Has patient had a PCN reaction occurring within the last 10 years: No If all of the above answers are "NO", then may proceed with Cephalosporin use.  . Vitamin D Analogs   . Cefuroxime Axetil Nausea Only and Rash  . Morphine And Related Rash    DISCHARGE MEDICATIONS:   Discharge Medication List as of 02/04/2015 11:49 AM    START taking these medications   Details  benazepril (LOTENSIN) 20 MG tablet Take 1 tablet (20 mg total) by mouth daily., Starting 02/04/2015, Until Discontinued, Print    conjugated estrogens (PREMARIN) vaginal cream Place 1 Applicatorful vaginally daily., Starting 02/04/2015, Until  Discontinued, Print      CONTINUE these medications which have CHANGED   Details  nitrofurantoin, macrocrystal-monohydrate, (MACROBID) 100 MG capsule Take 1 capsule (100 mg total) by mouth 2 (two) times daily. Bid for 6 days, then 100 mg po QD for 3 month, Starting  02/04/2015, Until Discontinued, Print      CONTINUE these medications which have NOT CHANGED   Details  aspirin 325 MG tablet Take 162.5 mg by mouth daily., Until Discontinued, Historical Med    bimatoprost (LUMIGAN) 0.03 % ophthalmic solution Place 1 drop into the right eye at bedtime., Until Discontinued, Historical Med    hydrALAZINE (APRESOLINE) 25 MG tablet Take 1 tablet (25 mg total) by mouth every 8 (eight) hours., Starting 12/05/2014, Until Discontinued, Normal    metoprolol succinate (TOPROL-XL) 25 MG 24 hr tablet Take 25 mg by mouth daily. (replaces carvedilol), Starting 01/27/2015, Until Sat 01/27/16, Historical Med    nitroGLYCERIN (NITROSTAT) 0.4 MG SL tablet Place 0.4 mg under the tongue every 5 (five) minutes as needed for chest pain. After 3rd dose if no relief call 911, Until Discontinued, Historical Med    omeprazole (PRILOSEC) 20 MG capsule Take 20 mg by mouth daily., Until Discontinued, Historical Med    oxybutynin (DITROPAN-XL) 5 MG 24 hr tablet Take 5 mg by mouth at bedtime., Until Discontinued, Historical Med    potassium chloride (KLOR-CON 10) 10 MEQ tablet Take 10 mEq by mouth daily as needed. Take one whenever taking one furosemide as needed, Until Discontinued, Historical Med    vitamin B-12 (CYANOCOBALAMIN) 1000 MCG tablet Take 1,000 mcg by mouth daily., Until Discontinued, Historical Med    !! warfarin (COUMADIN) 2 MG tablet Take 2 mg by mouth every Wednesday. Note dose, Until Discontinued, Historical Med    !! warfarin (COUMADIN) 3 MG tablet Take 3 mg by mouth See admin instructions. Take 1 tablet orally every day except on Wednesday take 2mg  tablet., Until Discontinued, Historical Med     !! - Potential duplicate medications found. Please discuss with provider.    STOP taking these medications     benazepril-hydrochlorthiazide (LOTENSIN HCT) 20-25 MG per tablet      doxycycline (VIBRAMYCIN) 100 MG capsule      furosemide (LASIX) 20 MG tablet           DISCHARGE INSTRUCTIONS:     If you experience worsening of your admission symptoms, develop shortness of breath, life threatening emergency, suicidal or homicidal thoughts you must seek medical attention immediately by calling 911 or calling your MD immediately  if symptoms less severe.  You Must read complete instructions/literature along with all the possible adverse reactions/side effects for all the Medicines you take and that have been prescribed to you. Take any new Medicines after you have completely understood and accept all the possible adverse reactions/side effects.   Please note  You were cared for by a hospitalist during your hospital stay. If you have any questions about your discharge medications or the care you received while you were in the hospital after you are discharged, you can call the unit and asked to speak with the hospitalist on call if the hospitalist that took care of you is not available. Once you are discharged, your primary care physician will handle any further medical issues. Please note that NO REFILLS for any discharge medications will be authorized once you are discharged, as it is imperative that you return to your primary care physician (or establish a relationship with a primary care  physician if you do not have one) for your aftercare needs so that they can reassess your need for medications and monitor your lab values.    Today   SUBJECTIVE   No complaint.   VITAL SIGNS:  Blood pressure 178/51, pulse 62, temperature 98 F (36.7 C), temperature source Oral, resp. rate 18, height 5\' 6"  (1.676 m), weight 78.926 kg (174 lb), SpO2 100 %.  I/O:   Intake/Output Summary (Last 24 hours) at 02/04/15 1413 Last data filed at 02/04/15 1100  Gross per 24 hour  Intake      0 ml  Output      0 ml  Net      0 ml    PHYSICAL EXAMINATION:  GENERAL:  81 y.o.-year-old patient lying in the bed with no acute distress.  EYES: Pupils equal, round,  reactive to light and accommodation. No scleral icterus. Extraocular muscles intact.  HEENT: Head atraumatic, normocephalic. Oropharynx and nasopharynx clear.  NECK:  Supple, no jugular venous distention. No thyroid enlargement, no tenderness.  LUNGS: Normal breath sounds bilaterally, no wheezing, rales,rhonchi or crepitation. No use of accessory muscles of respiration.  CARDIOVASCULAR: S1, S2 normal. No murmurs, rubs, or gallops.  ABDOMEN: Soft, non-tender, non-distended. Bowel sounds present. No organomegaly or mass.  EXTREMITIES: No pedal edema, cyanosis, or clubbing.  NEUROLOGIC: Cranial nerves II through XII are intact. Muscle strength 4/5 in all extremities. Sensation intact. Gait not checked.  PSYCHIATRIC: The patient is alert and oriented x 3.  SKIN: No obvious rash, lesion, or ulcer.   DATA REVIEW:   CBC  Recent Labs Lab 02/03/15 0637  WBC 12.5*  HGB 9.8*  HCT 29.8*  PLT 136*    Chemistries   Recent Labs Lab 01/28/15 1556  01/31/15 0445  02/01/15 0510  02/04/15 0606  NA 120*  < > 120*  < > 118*  < > 131*  K 3.5  < > 3.4*  --  3.6  --   --   CL 86*  < > 87*  --  87*  --   --   CO2 25  < > 24  --  22  --   --   GLUCOSE 105*  < > 110*  --  118*  --   --   BUN 27*  < > 13  --  14  --   --   CREATININE 0.91  < > 0.66  --  0.63  --   --   CALCIUM 9.0  < > 8.7*  --  8.7*  --   --   MG  --   < > 1.7  --   --   --   --   AST 31  --   --   --   --   --   --   ALT 19  --   --   --   --   --   --   ALKPHOS 93  --   --   --   --   --   --   BILITOT 0.6  --   --   --   --   --   --   < > = values in this interval not displayed.  Cardiac Enzymes No results for input(s): TROPONINI in the last 168 hours.  Microbiology Results  Results for orders placed or performed during the hospital encounter of 01/28/15  MRSA PCR Screening     Status: None   Collection  Time: 01/28/15  3:40 PM  Result Value Ref Range Status   MRSA by PCR NEGATIVE NEGATIVE Final    Comment:         The GeneXpert MRSA Assay (FDA approved for NASAL specimens only), is one component of a comprehensive MRSA colonization surveillance program. It is not intended to diagnose MRSA infection nor to guide or monitor treatment for MRSA infections.   Urine culture     Status: None   Collection Time: 01/28/15  5:23 PM  Result Value Ref Range Status   Specimen Description URINE, CLEAN CATCH  Final   Special Requests NONE  Final   Culture   Final    50,000 COLONIES/mL KLEBSIELLA PNEUMONIAE >=100,000 COLONIES/mL ESCHERICHIA COLI Results Called to: DONNISHA ROBERTSON AT 1212 ON 10/102/16 [facility]J ESBL-EXTENDED SPECTRUM BETA LACTAMASE-THE ORGANISM IS RESISTANT TO PENICILLINS, CEPHALOSPORINS AND AZTREONAM ACCORDING TO CLSI M100-S15 VOL.[place].    Report Status 02/01/2015 FINAL  Final   Organism ID, Bacteria KLEBSIELLA PNEUMONIAE  Final   Organism ID, Bacteria ESCHERICHIA COLI  Final      Susceptibility   Escherichia coli - MIC*    AMPICILLIN >=32 RESISTANT Resistant     CEFAZOLIN >=64 RESISTANT Resistant     CEFTRIAXONE >=64 RESISTANT Resistant     CIPROFLOXACIN >=4 RESISTANT Resistant     GENTAMICIN <=1 SENSITIVE Sensitive     IMIPENEM <=0.25 SENSITIVE Sensitive     NITROFURANTOIN <=16 SENSITIVE Sensitive     TRIMETH/SULFA >=320 RESISTANT Resistant     Extended ESBL POSITIVE Resistant     PIP/TAZO Value in next row Sensitive      SENSITIVE<=4    ERTAPENEM Value in next row Sensitive      SENSITIVE<=0.5    * >=100,000 COLONIES/mL ESCHERICHIA COLI   Klebsiella pneumoniae - MIC*    AMPICILLIN Value in next row Resistant      SENSITIVE<=0.5    CEFAZOLIN Value in next row Sensitive      SENSITIVE<=0.5    CEFTRIAXONE Value in next row Sensitive      SENSITIVE<=0.5    CIPROFLOXACIN Value in next row Sensitive      SENSITIVE<=0.5    GENTAMICIN Value in next row Sensitive      SENSITIVE<=0.5    IMIPENEM Value in next row Sensitive      SENSITIVE<=0.5    NITROFURANTOIN Value in  next row Intermediate      SENSITIVE<=0.5    TRIMETH/SULFA Value in next row Sensitive      SENSITIVE<=0.5    PIP/TAZO Value in next row Sensitive      SENSITIVE<=4    ERTAPENEM Value in next row Sensitive      SENSITIVE<=0.5    * 50,000 COLONIES/mL KLEBSIELLA PNEUMONIAE  Urine culture     Status: None   Collection Time: 01/29/15  7:00 AM  Result Value Ref Range Status   Specimen Description URINE, CLEAN CATCH  Final   Special Requests NONE  Final   Culture   Final    >=100,000 COLONIES/mL ESCHERICHIA COLI Results Called to: BRANDY MANSFIELD AT 1349 01/31/15 CTJ ESBL-EXTENDED SPECTRUM BETA LACTAMASE-THE ORGANISM IS RESISTANT TO PENICILLINS, CEPHALOSPORINS AND AZTREONAM ACCORDING TO CLSI M100-S15 VOL.[place].    Report Status 01/31/2015 FINAL  Final   Organism ID, Bacteria ESCHERICHIA COLI  Final      Susceptibility   Escherichia coli - MIC*    AMPICILLIN >=32 RESISTANT Resistant     CEFAZOLIN >=64 RESISTANT Resistant     CEFTRIAXONE >=  64 RESISTANT Resistant     CIPROFLOXACIN >=4 RESISTANT Resistant     GENTAMICIN <=1 SENSITIVE Sensitive     IMIPENEM <=0.25 SENSITIVE Sensitive     NITROFURANTOIN <=16 SENSITIVE Sensitive     TRIMETH/SULFA >=320 RESISTANT Resistant     Extended ESBL POSITIVE Resistant     PIP/TAZO Value in next row Sensitive      SENSITIVE<=4    * >=100,000 COLONIES/mL ESCHERICHIA COLI    RADIOLOGY:  No results found.      Management plans discussed with the patient, her daughter and they are in agreement.  CODE STATUS:     Code Status Orders        Start     Ordered   01/28/15 1949  Do not attempt resuscitation (DNR)   Continuous    Question Answer Comment  In the event of cardiac or respiratory ARREST Do not call a "code blue"   In the event of cardiac or respiratory ARREST Do not perform Intubation, CPR, defibrillation or ACLS   In the event of cardiac or respiratory ARREST Use medication by any route, position, wound care, and other  measures to relive pain and suffering. May use oxygen, suction and manual treatment of airway obstruction as needed for comfort.   Comments RN may pronounce      01/28/15 1948    Advance Directive Documentation        Most Recent Value   Type of Advance Directive  Healthcare Power of Attorney   Pre-existing out of facility DNR order (yellow form or pink MOST form)     "MOST" Form in Place?        TOTAL TIME TAKING CARE OF THIS PATIENT: 39 minutes.    Demetrios Loll M.D on 02/04/2015 at 2:13 PM   Between 7am to 6pm - Pager - [phone]  After 6pm go to [email or web address] - password [email or web address] [email or web address]  Office  [phone]  CC: Primary care physician; Chrisandra Carota, MD

## 2015-02-06 ENCOUNTER — Encounter: Payer: Self-pay | Admitting: Emergency Medicine

## 2015-02-06 ENCOUNTER — Inpatient Hospital Stay
Admission: EM | Admit: 2015-02-06 | Discharge: 2015-02-07 | DRG: 641 | Disposition: A | Discharge status: Skilled Nursing Facility | Payer: Medicare Other | Attending: Internal Medicine | Admitting: Internal Medicine

## 2015-02-06 DIAGNOSIS — Z880 Allergy status to penicillin: Secondary | ICD-10-CM

## 2015-02-06 DIAGNOSIS — E871 Hypo-osmolality and hyponatremia: Secondary | ICD-10-CM | POA: Diagnosis present

## 2015-02-06 DIAGNOSIS — I110 Hypertensive heart disease with heart failure: Secondary | ICD-10-CM | POA: Diagnosis present

## 2015-02-06 DIAGNOSIS — K219 Gastro-esophageal reflux disease without esophagitis: Secondary | ICD-10-CM | POA: Diagnosis present

## 2015-02-06 DIAGNOSIS — Z881 Allergy status to other antibiotic agents status: Secondary | ICD-10-CM

## 2015-02-06 DIAGNOSIS — G510 Bell's palsy: Secondary | ICD-10-CM | POA: Diagnosis present

## 2015-02-06 DIAGNOSIS — Z9109 Other allergy status, other than to drugs and biological substances: Secondary | ICD-10-CM

## 2015-02-06 DIAGNOSIS — R4182 Altered mental status, unspecified: Secondary | ICD-10-CM | POA: Diagnosis present

## 2015-02-06 DIAGNOSIS — Z66 Do not resuscitate: Secondary | ICD-10-CM | POA: Diagnosis present

## 2015-02-06 DIAGNOSIS — Z8673 Personal history of transient ischemic attack (TIA), and cerebral infarction without residual deficits: Secondary | ICD-10-CM

## 2015-02-06 DIAGNOSIS — Z96652 Presence of left artificial knee joint: Secondary | ICD-10-CM | POA: Diagnosis present

## 2015-02-06 DIAGNOSIS — E876 Hypokalemia: Secondary | ICD-10-CM | POA: Diagnosis not present

## 2015-02-06 DIAGNOSIS — E785 Hyperlipidemia, unspecified: Secondary | ICD-10-CM | POA: Diagnosis present

## 2015-02-06 DIAGNOSIS — I482 Chronic atrial fibrillation: Secondary | ICD-10-CM | POA: Diagnosis present

## 2015-02-06 DIAGNOSIS — M1990 Unspecified osteoarthritis, unspecified site: Secondary | ICD-10-CM | POA: Diagnosis present

## 2015-02-06 DIAGNOSIS — N390 Urinary tract infection, site not specified: Secondary | ICD-10-CM | POA: Diagnosis present

## 2015-02-06 DIAGNOSIS — H409 Unspecified glaucoma: Secondary | ICD-10-CM | POA: Diagnosis present

## 2015-02-06 DIAGNOSIS — Z7901 Long term (current) use of anticoagulants: Secondary | ICD-10-CM

## 2015-02-06 DIAGNOSIS — I509 Heart failure, unspecified: Secondary | ICD-10-CM | POA: Diagnosis present

## 2015-02-06 LAB — CBC WITH DIFFERENTIAL/PLATELET
Basophils Absolute: 0 10*3/uL (ref 0–0.1)
Basophils Relative: 0 %
EOS ABS: 0 10*3/uL (ref 0–0.7)
Eosinophils Relative: 1 %
HCT: 33.7 % — ABNORMAL LOW (ref 35.0–47.0)
HEMOGLOBIN: 11.2 g/dL — AB (ref 12.0–16.0)
LYMPHS ABS: 1.1 10*3/uL (ref 1.0–3.6)
LYMPHS PCT: 13 %
MCH: 28.4 pg (ref 26.0–34.0)
MCHC: 33.3 g/dL (ref 32.0–36.0)
MCV: 85.4 fL (ref 80.0–100.0)
Monocytes Absolute: 0.4 10*3/uL (ref 0.2–0.9)
Monocytes Relative: 6 %
NEUTROS ABS: 6.6 10*3/uL — AB (ref 1.4–6.5)
NEUTROS PCT: 80 %
Platelets: 174 10*3/uL (ref 150–440)
RBC: 3.95 MIL/uL (ref 3.80–5.20)
RDW: 15.1 % — ABNORMAL HIGH (ref 11.5–14.5)
WBC: 8.1 10*3/uL (ref 3.6–11.0)

## 2015-02-06 LAB — COMPREHENSIVE METABOLIC PANEL
ALK PHOS: 87 U/L (ref 38–126)
ALT: 19 U/L (ref 14–54)
AST: 26 U/L (ref 15–41)
Albumin: 3.7 g/dL (ref 3.5–5.0)
Anion gap: 11 (ref 5–15)
BUN: 11 mg/dL (ref 6–20)
CALCIUM: 9.1 mg/dL (ref 8.9–10.3)
CO2: 25 mmol/L (ref 22–32)
CREATININE: 0.55 mg/dL (ref 0.44–1.00)
Chloride: 91 mmol/L — ABNORMAL LOW (ref 101–111)
GFR calc non Af Amer: >60 mL/min (ref >60)
GLUCOSE: 123 mg/dL — AB (ref 65–99)
Potassium: 2.9 mmol/L — CL (ref 3.5–5.1)
SODIUM: 127 mmol/L — AB (ref 135–145)
Total Bilirubin: 1.2 mg/dL (ref 0.3–1.2)
Total Protein: 6.5 g/dL (ref 6.5–8.1)

## 2015-02-06 LAB — BASIC METABOLIC PANEL
Anion gap: 9 (ref 5–15)
BUN: 10 mg/dL (ref 6–20)
CO2: 25 mmol/L (ref 22–32)
Calcium: 8.8 mg/dL — ABNORMAL LOW (ref 8.9–10.3)
Chloride: 95 mmol/L — ABNORMAL LOW (ref 101–111)
Creatinine, Ser: 0.5 mg/dL (ref 0.44–1.00)
GFR calc Af Amer: >60 mL/min (ref >60)
Glucose, Bld: 124 mg/dL — ABNORMAL HIGH (ref 65–99)
POTASSIUM: 3.3 mmol/L — AB (ref 3.5–5.1)
SODIUM: 129 mmol/L — AB (ref 135–145)

## 2015-02-06 LAB — MAGNESIUM
MAGNESIUM: 1.4 mg/dL — AB (ref 1.7–2.4)
Magnesium: 1 mg/dL — ABNORMAL LOW (ref 1.7–2.4)

## 2015-02-06 LAB — TROPONIN I: TROPONIN I: 0.05 ng/mL — AB (ref <0.031)

## 2015-02-06 LAB — URINALYSIS COMPLETE WITH MICROSCOPIC (ARMC ONLY)
Bacteria, UA: NONE SEEN
Bilirubin Urine: NEGATIVE
Glucose, UA: NEGATIVE mg/dL
Nitrite: NEGATIVE
Protein, ur: 100 mg/dL — AB
Specific Gravity, Urine: 1.009 (ref 1.005–1.030)
pH: 5 (ref 5.0–8.0)

## 2015-02-06 LAB — PROTIME-INR
INR: 2.17
Prothrombin Time: 24.3 seconds — ABNORMAL HIGH (ref 11.4–15.0)

## 2015-02-06 MED ORDER — POTASSIUM CHLORIDE CRYS ER 20 MEQ PO TBCR
20.0000 meq | EXTENDED_RELEASE_TABLET | Freq: Once | ORAL | Status: AC
Start: 2015-02-06 — End: 2015-02-06
  Administered 2015-02-06: 20 meq via ORAL
  Filled 2015-02-06: qty 1

## 2015-02-06 MED ORDER — WARFARIN SODIUM 3 MG PO TABS
3.0000 mg | ORAL_TABLET | ORAL | Status: DC
  Administered 2015-02-06: 18:00:00 3 mg via ORAL
  Filled 2015-02-06: qty 1

## 2015-02-06 MED ORDER — POTASSIUM CHLORIDE CRYS ER 20 MEQ PO TBCR
20.0000 meq | EXTENDED_RELEASE_TABLET | Freq: Once | ORAL | Status: AC
  Administered 2015-02-06: 17:00:00 20 meq via ORAL
  Filled 2015-02-06: qty 1

## 2015-02-06 MED ORDER — VITAMIN B-12 1000 MCG PO TABS
1000.0000 ug | ORAL_TABLET | Freq: Every day | ORAL | Status: DC
  Administered 2015-02-06 – 2015-02-07 (×2): 1000 ug via ORAL
  Filled 2015-02-06 (×2): qty 1

## 2015-02-06 MED ORDER — SODIUM CHLORIDE 3 % IV SOLN: INTRAVENOUS | Status: DC

## 2015-02-06 MED ORDER — NITROGLYCERIN 0.4 MG SL SUBL: 0.4000 mg | SUBLINGUAL_TABLET | SUBLINGUAL | Status: DC | PRN

## 2015-02-06 MED ORDER — ASPIRIN 325 MG PO TABS
162.5000 mg | ORAL_TABLET | Freq: Every day | ORAL | Status: DC
  Administered 2015-02-06 – 2015-02-07 (×2): 162.5 mg via ORAL
  Filled 2015-02-06 (×2): qty 1

## 2015-02-06 MED ORDER — POTASSIUM CHLORIDE CRYS ER 10 MEQ PO TBCR: 10.0000 meq | EXTENDED_RELEASE_TABLET | Freq: Every day | ORAL | Status: DC | PRN

## 2015-02-06 MED ORDER — BENAZEPRIL HCL 20 MG PO TABS
20.0000 mg | ORAL_TABLET | Freq: Every day | ORAL | Status: DC
  Administered 2015-02-06 – 2015-02-07 (×2): 20 mg via ORAL
  Filled 2015-02-06 (×2): qty 1

## 2015-02-06 MED ORDER — SODIUM CHLORIDE 0.9 % IV BOLUS (SEPSIS)
500.0000 mL | Freq: Once | INTRAVENOUS | Status: AC
  Administered 2015-02-06: 500 mL via INTRAVENOUS

## 2015-02-06 MED ORDER — PANTOPRAZOLE SODIUM 40 MG PO TBEC
40.0000 mg | DELAYED_RELEASE_TABLET | Freq: Every day | ORAL | Status: DC
  Administered 2015-02-07: 11:00:00 40 mg via ORAL
  Filled 2015-02-06: qty 1

## 2015-02-06 MED ORDER — ESTROGENS, CONJUGATED 0.625 MG/GM VA CREA
1.0000 | TOPICAL_CREAM | Freq: Every day | VAGINAL | Status: DC
  Administered 2015-02-07: 11:00:00 1 via VAGINAL
  Filled 2015-02-06: qty 30

## 2015-02-06 MED ORDER — LATANOPROST 0.005 % OP SOLN
1.0000 [drp] | Freq: Every day | OPHTHALMIC | Status: DC
  Administered 2015-02-06: 21:00:00 1 [drp] via OPHTHALMIC
  Filled 2015-02-06: qty 2.5

## 2015-02-06 MED ORDER — HYDRALAZINE HCL 50 MG PO TABS
25.0000 mg | ORAL_TABLET | Freq: Three times a day (TID) | ORAL | Status: DC
  Administered 2015-02-06 – 2015-02-07 (×3): 25 mg via ORAL
  Filled 2015-02-06 (×3): qty 1

## 2015-02-06 MED ORDER — WARFARIN SODIUM 2 MG PO TABS: 2.0000 mg | ORAL_TABLET | ORAL | Status: DC

## 2015-02-06 MED ORDER — NITROFURANTOIN MONOHYD MACRO 100 MG PO CAPS: 100.0000 mg | ORAL_CAPSULE | Freq: Two times a day (BID) | ORAL | Status: DC

## 2015-02-06 MED ORDER — POTASSIUM CHLORIDE 10 MEQ/100ML IV SOLN
10.0000 meq | INTRAVENOUS | Status: AC
  Administered 2015-02-07 (×4): 10 meq via INTRAVENOUS
  Filled 2015-02-06 (×4): qty 100

## 2015-02-06 MED ORDER — MAGNESIUM SULFATE 2 GM/50ML IV SOLN
2.0000 g | Freq: Once | INTRAVENOUS | Status: AC
  Administered 2015-02-06: 2 g via INTRAVENOUS
  Filled 2015-02-06: qty 50

## 2015-02-06 MED ORDER — OXYBUTYNIN CHLORIDE ER 5 MG PO TB24
5.0000 mg | ORAL_TABLET | Freq: Every day | ORAL | Status: DC
  Administered 2015-02-06: 21:00:00 5 mg via ORAL
  Filled 2015-02-06: qty 1

## 2015-02-06 MED ORDER — SENNOSIDES-DOCUSATE SODIUM 8.6-50 MG PO TABS: 1.0000 | ORAL_TABLET | Freq: Every evening | ORAL | Status: DC | PRN

## 2015-02-06 MED ORDER — METOPROLOL SUCCINATE ER 25 MG PO TB24
25.0000 mg | ORAL_TABLET | Freq: Every day | ORAL | Status: DC
  Administered 2015-02-06 – 2015-02-07 (×2): 25 mg via ORAL
  Filled 2015-02-06 (×2): qty 1

## 2015-02-06 MED ORDER — WARFARIN - PHARMACIST DOSING INPATIENT: Freq: Every day | Status: DC

## 2015-02-06 MED ORDER — POTASSIUM CHLORIDE IN NACL 40-0.9 MEQ/L-% IV SOLN
INTRAVENOUS | Status: DC
Start: 2015-02-06 — End: 2015-02-07
  Administered 2015-02-06: 16:00:00 75 mL/h via INTRAVENOUS
  Filled 2015-02-06 (×3): qty 1000

## 2015-02-06 MED ORDER — ACETAMINOPHEN 325 MG PO TABS
650.0000 mg | ORAL_TABLET | Freq: Four times a day (QID) | ORAL | Status: DC | PRN
  Administered 2015-02-07: 650 mg via ORAL
  Filled 2015-02-06: qty 2

## 2015-02-06 MED ORDER — ACETAMINOPHEN 650 MG RE SUPP: 650.0000 mg | Freq: Four times a day (QID) | RECTAL | Status: DC | PRN

## 2015-02-06 NOTE — H&P (Signed)
[place] at [place] NAME: Joyce Vasquez    MR#:  [id number]  DATE OF BIRTH:  1917/04/03  DATE OF ADMISSION:  02/06/2015  PRIMARY CARE PHYSICIAN: Chrisandra Carota, MD   REQUESTING/REFERRING PHYSICIAN: Dr Corky Downs   Chief complaints:   generalized weakness HISTORY OF PRESENT ILLNESS:  Joyce Vasquez  is a 81 y.o. female with a known history of chronic A. fib on Coumadin, hypertension, hyperlipidemia, glaucoma who was just discharged 02/04/2015 after she was admitted for acute hyponatremia hypomagnesemia and hypo kalemia.patient was discharged to [facility]. Her hydrochlorothiazide and Lasix were discontinued.   She was brought in after family noted patient is confused has been having a lot of weakness getting around in her apartment at [facility]. Brought to the emergency room her sodium was 127, magnesium 1.0, potassium is 2.9. She is being admitted for IV electrolyte replacement generalized weakness/deconditioning.  Patient per se does not have any other complaints. She also is on Macrobid for her recent UTI  PAST MEDICAL HISTORY:   Past Medical History  Diagnosis Date  . Hypertension   . Arthritis     osteoarthritis  . Hyperlipidemia   . Temporal giant cell arteritis (HCC)     left eye  . Atrial fibrillation ([place])   . GERD (gastroesophageal reflux disease)   . CHF (congestive heart failure) ([place])   . Glaucoma   . DJD (degenerative joint disease)   . TIA (transient ischemic attack)   . Bell's palsy   . Cancer ([place])     skin    PAST SURGICAL HISTOIRY:   Past Surgical History  Procedure Laterality Date  . Carotid endarterectomy Right   . Appendectomy    . Abdominal hysterectomy    . Cholecystectomy    . Cardiac catheterization    . Joint replacement Bilateral     Right and Left knee Replacement  . Breast surgery Left     Excisional Breast Biopsy  . Tonsillectomy    . Eye surgery Right     Cataract Extraction--right  eye only  . Pacemaker insertion Left 11/29/2014    Procedure: INSERTION PACEMAKER;  Surgeon: Isaias Cowman, MD;  Location: [facility] ORS;  Service: Cardiovascular;  Laterality: Left;    SOCIAL HISTORY:   Social History  Substance Use Topics  . Smoking status: Never Smoker   . Smokeless tobacco: Never Used  . Alcohol Use: No    FAMILY HISTORY:  No family history on file.  DRUG ALLERGIES:   Allergies  Allergen Reactions  . Coreg [Carvedilol]   . Erythromycin Base Nausea And Vomiting  . Penicillins Swelling    "swelling of throat" Has patient had a PCN reaction causing immediate rash, facial/tongue/throat swelling, SOB or lightheadedness with hypotension: Yes Has patient had a PCN reaction causing severe rash involving mucus membranes or skin necrosis: No Has patient had a PCN reaction that required hospitalization No Has patient had a PCN reaction occurring within the last 10 years: No If all of the above answers are "NO", then may proceed with Cephalosporin use.  . Vitamin D Analogs   . Cefuroxime Axetil Nausea Only and Rash  . Morphine And Related Rash    REVIEW OF SYSTEMS:  Review of Systems  Constitutional: Positive for malaise/fatigue. Negative for fever, chills and weight loss.  HENT: Negative for ear discharge, ear pain and nosebleeds.   Eyes: Negative for blurred vision, pain and discharge.  Respiratory: Negative for sputum production, shortness of breath,  wheezing and stridor.   Cardiovascular: Negative for chest pain, palpitations, orthopnea and PND.  Gastrointestinal: Negative for nausea, vomiting, abdominal pain and diarrhea.  Genitourinary: Negative for urgency and frequency.  Musculoskeletal: Positive for joint pain. Negative for back pain.  Neurological: Positive for weakness. Negative for sensory change, speech change and focal weakness.  Psychiatric/Behavioral: Negative for depression and hallucinations. The patient is not nervous/anxious.   All other  systems reviewed and are negative.    MEDICATIONS AT HOME:   Prior to Admission medications   Medication Sig Start Date End Date Taking? Authorizing Provider  aspirin 325 MG tablet Take 162.5 mg by mouth daily.   Yes Historical Provider, MD  benazepril (LOTENSIN) 20 MG tablet Take 1 tablet (20 mg total) by mouth daily. 02/04/15  Yes Demetrios Loll, MD  bimatoprost (LUMIGAN) 0.03 % ophthalmic solution Place 1 drop into the right eye at bedtime.   Yes Historical Provider, MD  conjugated estrogens (PREMARIN) vaginal cream Place 1 Applicatorful vaginally daily. 02/04/15  Yes Demetrios Loll, MD  hydrALAZINE (APRESOLINE) 25 MG tablet Take 1 tablet (25 mg total) by mouth every 8 (eight) hours. 12/05/14  Yes Epifanio Lesches, MD  metoprolol succinate (TOPROL-XL) 25 MG 24 hr tablet Take 25 mg by mouth daily.    Yes Historical Provider, MD  nitrofurantoin, macrocrystal-monohydrate, (MACROBID) 100 MG capsule Take 1 capsule (100 mg total) by mouth 2 (two) times daily. Bid for 6 days, then 100 mg po QD for 3 month 02/04/15  Yes Demetrios Loll, MD  nitroGLYCERIN (NITROSTAT) 0.4 MG SL tablet Place 0.4 mg under the tongue every 5 (five) minutes as needed for chest pain. After 3rd dose if no relief call 911   Yes Historical Provider, MD  omeprazole (PRILOSEC) 20 MG capsule Take 20 mg by mouth daily.   Yes Historical Provider, MD  oxybutynin (DITROPAN-XL) 5 MG 24 hr tablet Take 5 mg by mouth at bedtime.   Yes Historical Provider, MD  potassium chloride (KLOR-CON 10) 10 MEQ tablet Take 10 mEq by mouth daily as needed (when taking furosemide).    Yes Historical Provider, MD  vitamin B-12 (CYANOCOBALAMIN) 1000 MCG tablet Take 1,000 mcg by mouth daily.   Yes Historical Provider, MD  warfarin (COUMADIN) 2 MG tablet Take 2 mg by mouth every Wednesday. Note dose   Yes Historical Provider, MD  warfarin (COUMADIN) 3 MG tablet Take 3 mg by mouth See admin instructions. Take 1 tablet orally every day except on Wednesday take 2mg  tablet.    Yes Historical Provider, MD      VITAL SIGNS:  Blood pressure 171/78, pulse 72, temperature 97.2 F (36.2 C), temperature source Oral, resp. rate 18, height 5\' 6"  (1.676 m), weight 81.2 kg (179 lb 0.2 oz), SpO2 98 %.  PHYSICAL EXAMINATION:  GENERAL:  81 y.o.-year-old patient lying in the bed with no acute distress.  EYES: Pupils equal, round, reactive to light and accommodation. No scleral icterus. Extraocular muscles intact.  HEENT: Head atraumatic, normocephalic. Oropharynx and nasopharynx clear.  NECK:  Supple, no jugular venous distention. No thyroid enlargement, no tenderness.  LUNGS: Normal breath sounds bilaterally, no wheezing, rales,rhonchi or crepitation. No use of accessory muscles of respiration.  CARDIOVASCULAR: S1, S2 normal. No murmurs, rubs, or gallops.  ABDOMEN: Soft, nontender, nondistended. Bowel sounds present. No organomegaly or mass.  EXTREMITIES: ++ pedal edema, cyanosis, or clubbing.  NEUROLOGIC: Cranial nerves II through XII are intact. Muscle strength 4/5 in all extremities. Sensation intact. Gait not checked.  PSYCHIATRIC: The patient  is alert and oriented x 2.  SKIN: No obvious rash, lesion, or ulcer.   LABORATORY PANEL:   CBC  Recent Labs Lab 02/06/15 1012  WBC 8.1  HGB 11.2*  HCT 33.7*  PLT 174   ------------------------------------------------------------------------------------------------------------------  Chemistries   Recent Labs Lab 02/06/15 1012  NA 127*  K 2.9*  CL 91*  CO2 25  GLUCOSE 123*  BUN 11  CREATININE 0.55  CALCIUM 9.1  MG 1.0*  AST 26  ALT 19  ALKPHOS 87  BILITOT 1.2   ------------------------------------------------------------------------------------------------------------------  Cardiac Enzymes  Recent Labs Lab 02/06/15 1012  TROPONINI 0.05*    EKG:  No acute ST_T changes, NSR  IMPRESSION AND PLAN:  81 y.o. female with a known history of chronic A. fib on Coumadin, hypertension, hyperlipidemia,  glaucoma who was just discharged 02/04/2015 after she was admitted for acute hyponatremia hypomagnesemia and hypo kalemia.patient was discharged to [facility]. Her hydrochlorothiazide and Lasix were discontinued.    1. Acute hyponatremia, hypomagnesemia, hypokalemia Admit to medical surgical floor   IV sodium chloride with potassium and IV magnesium   pharmacy consult for electrolyte replacement Recently patient's Lasix and hydro chlorothiazide were discontinued Start by mouth magnesium and potassium Patient appears clinically well hydrated  2. Generalized weakness, deconditioning Patient is at [facility] independent facility. Daughter expresses patient is declining as far as her strength. And is requesting patient get more help. Will get physical therapy involved. Social worker for discharge planning assisted living versus rehabilitation  3. Hypertension continue benazepril  4. Chronic A. Fib Continue metoprolol and Coumadin  5. CODE STATUS DO NOT RESUSCITATE. Patient carries out of facility DO NOT RESUSCITATE form  Admission plan was discussed with patient's daughter was present emergency room.   All the records are reviewed and case discussed with ED provider. Management plans discussed with the patient, family and they are in agreement.  CODE STATUS: DO NOT RESUSCITATE  TOTAL TIME TAKING CARE OF THIS PATIENT: 50  minutes.    Willamae Demby M.D on 02/06/2015 at 1:57 PM  Between 7am to 6pm - Pager - [phone]  After 6pm go to [email or web address] - password [email or web address] [facility]  Office  [phone]  CC: Primary care physician; Chrisandra Carota, MD

## 2015-02-06 NOTE — Plan of Care (Addendum)
Problem: Discharge Progression Outcomes Goal: Other Discharge Outcomes/Goals Outcome: Progressing Plan of care progress to goal for: 1. Pain-no c/o pain this shift, pt resting comfortably in bed 2. Hemodynamically-             -VSS, pt remains a febrile this shift             -IV fluids started this shift per orders 3. Complications-no evidence of this shift 4. Diet-pt tolerating diet this shift 5. Activity-pt needs assistance with movement in bed Pt placed on contact isolation precautions due to recent ESBL UTI

## 2015-02-06 NOTE — ED Notes (Signed)
Dr. Marcelene Butte notified K 2.9 and troponin 0.05 called back by lab

## 2015-02-06 NOTE — Consult Note (Signed)
MEDICATION RELATED CONSULT NOTE - INITIAL   Pharmacy Consult for electrolytes Indication: hypokalemia, hypomagensium  Allergies  Allergen Reactions  . Coreg [Carvedilol]   . Erythromycin Base Nausea And Vomiting  . Penicillins Swelling    "swelling of throat" Has patient had a PCN reaction causing immediate rash, facial/tongue/throat swelling, SOB or lightheadedness with hypotension: Yes Has patient had a PCN reaction causing severe rash involving mucus membranes or skin necrosis: No Has patient had a PCN reaction that required hospitalization No Has patient had a PCN reaction occurring within the last 10 years: No If all of the above answers are "NO", then may proceed with Cephalosporin use.  . Vitamin D Analogs   . Cefuroxime Axetil Nausea Only and Rash  . Morphine And Related Rash    Patient Measurements: Height: 5\' 6"  (167.6 cm) Weight: 176 lb 6 oz (80.003 kg) IBW/kg (Calculated) : 59.3 Adjusted Body Weight:   Vital Signs: Temp: 98.2 F (36.8 C) (10/17 1532) Temp Source: Oral (10/17 1532) BP: 179/58 mmHg (10/17 1532) Pulse Rate: 87 (10/17 1532) Intake/Output from previous day:   Intake/Output from this shift:    Labs:  Recent Labs  02/06/15 1012  WBC 8.1  HGB 11.2*  HCT 33.7*  PLT 174  CREATININE 0.55  MG 1.0*  ALBUMIN 3.7  PROT 6.5  AST 26  ALT 19  ALKPHOS 87  BILITOT 1.2   Estimated Creatinine Clearance: 44.9 mL/min (by C-G formula based on Cr of 0.55).   Microbiology: Recent Results (from the past 720 hour(s))  MRSA PCR Screening     Status: None   Collection Time: 01/28/15  3:40 PM  Result Value Ref Range Status   MRSA by PCR NEGATIVE NEGATIVE Final    Comment:        The GeneXpert MRSA Assay (FDA approved for NASAL specimens only), is one component of a comprehensive MRSA colonization surveillance program. It is not intended to diagnose MRSA infection nor to guide or monitor treatment for MRSA infections.   Urine culture     Status:  None   Collection Time: 01/28/15  5:23 PM  Result Value Ref Range Status   Specimen Description URINE, CLEAN CATCH  Final   Special Requests NONE  Final   Culture   Final    50,000 COLONIES/mL KLEBSIELLA PNEUMONIAE >=100,000 COLONIES/mL ESCHERICHIA COLI Results Called to: DONNISHA ROBERTSON AT 1212 ON 10/102/16 CTJ ESBL-EXTENDED SPECTRUM BETA LACTAMASE-THE ORGANISM IS RESISTANT TO PENICILLINS, CEPHALOSPORINS AND AZTREONAM ACCORDING TO CLSI M100-S15 VOL.[place].    Report Status 02/01/2015 FINAL  Final   Organism ID, Bacteria KLEBSIELLA PNEUMONIAE  Final   Organism ID, Bacteria ESCHERICHIA COLI  Final      Susceptibility   Escherichia coli - MIC*    AMPICILLIN >=32 RESISTANT Resistant     CEFAZOLIN >=64 RESISTANT Resistant     CEFTRIAXONE >=64 RESISTANT Resistant     CIPROFLOXACIN >=4 RESISTANT Resistant     GENTAMICIN <=1 SENSITIVE Sensitive     IMIPENEM <=0.25 SENSITIVE Sensitive     NITROFURANTOIN <=16 SENSITIVE Sensitive     TRIMETH/SULFA >=320 RESISTANT Resistant     Extended ESBL POSITIVE Resistant     PIP/TAZO Value in next row Sensitive      SENSITIVE<=4    ERTAPENEM Value in next row Sensitive      SENSITIVE<=0.5    * >=100,000 COLONIES/mL ESCHERICHIA COLI   Klebsiella pneumoniae - MIC*    AMPICILLIN Value in next row Resistant  SENSITIVE<=0.5    CEFAZOLIN Value in next row Sensitive      SENSITIVE<=0.5    CEFTRIAXONE Value in next row Sensitive      SENSITIVE<=0.5    CIPROFLOXACIN Value in next row Sensitive      SENSITIVE<=0.5    GENTAMICIN Value in next row Sensitive      SENSITIVE<=0.5    IMIPENEM Value in next row Sensitive      SENSITIVE<=0.5    NITROFURANTOIN Value in next row Intermediate      SENSITIVE<=0.5    TRIMETH/SULFA Value in next row Sensitive      SENSITIVE<=0.5    PIP/TAZO Value in next row Sensitive      SENSITIVE<=4    ERTAPENEM Value in next row Sensitive      SENSITIVE<=0.5    * 50,000 COLONIES/mL KLEBSIELLA PNEUMONIAE   Urine culture     Status: None   Collection Time: 01/29/15  7:00 AM  Result Value Ref Range Status   Specimen Description URINE, CLEAN CATCH  Final   Special Requests NONE  Final   Culture   Final    >=100,000 COLONIES/mL ESCHERICHIA COLI Results Called to: BRANDY MANSFIELD AT 1349 01/31/15 CTJ ESBL-EXTENDED SPECTRUM BETA LACTAMASE-THE ORGANISM IS RESISTANT TO PENICILLINS, CEPHALOSPORINS AND AZTREONAM ACCORDING TO CLSI M100-S15 VOL.[place].    Report Status 01/31/2015 FINAL  Final   Organism ID, Bacteria ESCHERICHIA COLI  Final      Susceptibility   Escherichia coli - MIC*    AMPICILLIN >=32 RESISTANT Resistant     CEFAZOLIN >=64 RESISTANT Resistant     CEFTRIAXONE >=64 RESISTANT Resistant     CIPROFLOXACIN >=4 RESISTANT Resistant     GENTAMICIN <=1 SENSITIVE Sensitive     IMIPENEM <=0.25 SENSITIVE Sensitive     NITROFURANTOIN <=16 SENSITIVE Sensitive     TRIMETH/SULFA >=320 RESISTANT Resistant     Extended ESBL POSITIVE Resistant     PIP/TAZO Value in next row Sensitive      SENSITIVE<=4    * >=100,000 COLONIES/mL ESCHERICHIA COLI    Medical History: Past Medical History  Diagnosis Date  . Hypertension   . Arthritis     osteoarthritis  . Hyperlipidemia   . Temporal giant cell arteritis (HCC)     left eye  . Atrial fibrillation ([place])   . GERD (gastroesophageal reflux disease)   . CHF (congestive heart failure) ([place])   . Glaucoma   . DJD (degenerative joint disease)   . TIA (transient ischemic attack)   . Bell's palsy   . Cancer ([place])     skin    Medications:  Scheduled:  . aspirin  162.5 mg Oral Daily  . benazepril  20 mg Oral Daily  . conjugated estrogens  1 Applicatorful Vaginal Daily  . hydrALAZINE  25 mg Oral 3 times per day  . latanoprost  1 drop Right Eye QHS  . metoprolol succinate  25 mg Oral Daily  . nitrofurantoin (macrocrystal-monohydrate)  100 mg Oral BID  . oxybutynin  5 mg Oral QHS  . pantoprazole  40 mg Oral Daily  . vitamin B-12   1,000 mcg Oral Daily  . [START ON 02/08/2015] warfarin  2 mg Oral Q Wed  . warfarin  3 mg Oral See admin instructions    Assessment: Pt is a 81 year old female admitted with a K of 2.39, mag 1 and sodium 127. Pt was started of 0.9 sodium chloride with 40MEQ of K at 66ml/hr  (3MEQ/hr) and given  20 meq po. Pt was also given magnesium 2g IV once.   Goal of Therapy:  k =2.5-5, mg =1.7-2.4 Na 135-145  Plan:  With a K of 2.9 pt will most likely need need additional supplementation. Will give an additional 20 Meq po now. Patient was given Mg and po K at 1221. Recheck Mg and BMP at 2000. Will give additional supplementation as needed.   Shanicka Oldenkamp D Cadell Gabrielson 02/06/2015,4:10 PM

## 2015-02-06 NOTE — Progress Notes (Addendum)
MEDICATION RELATED CONSULT NOTE - INITIAL   Pharmacy Consult for Electrolytes Indication: Hypomagnesia, hypokalemia  Allergies  Allergen Reactions  . Coreg [Carvedilol]   . Erythromycin Base Nausea And Vomiting  . Penicillins Swelling    "swelling of throat" Has patient had a PCN reaction causing immediate rash, facial/tongue/throat swelling, SOB or lightheadedness with hypotension: Yes Has patient had a PCN reaction causing severe rash involving mucus membranes or skin necrosis: No Has patient had a PCN reaction that required hospitalization No Has patient had a PCN reaction occurring within the last 10 years: No If all of the above answers are "NO", then may proceed with Cephalosporin use.  . Vitamin D Analogs   . Cefuroxime Axetil Nausea Only and Rash  . Morphine And Related Rash    Patient Measurements: Height: 5\' 6"  (167.6 cm) Weight: 176 lb 6 oz (80.003 kg) IBW/kg (Calculated) : 59.3 Adjusted Body Weight:   Vital Signs: Temp: 98.5 F (36.9 C) (10/17 2123) Temp Source: Oral (10/17 2123) BP: 171/86 mmHg (10/17 2123) Pulse Rate: 74 (10/17 2123) Intake/Output from previous day:   Intake/Output from this shift:    Labs:  Recent Labs  02/06/15 1012 02/06/15 2023  WBC 8.1  --   HGB 11.2*  --   HCT 33.7*  --   PLT 174  --   CREATININE 0.55 0.50  MG 1.0* 1.4*  ALBUMIN 3.7  --   PROT 6.5  --   AST 26  --   ALT 19  --   ALKPHOS 87  --   BILITOT 1.2  --    Estimated Creatinine Clearance: 44.9 mL/min (by C-G formula based on Cr of 0.5).   Microbiology: Recent Results (from the past 720 hour(s))  MRSA PCR Screening     Status: None   Collection Time: 01/28/15  3:40 PM  Result Value Ref Range Status   MRSA by PCR NEGATIVE NEGATIVE Final    Comment:        The GeneXpert MRSA Assay (FDA approved for NASAL specimens only), is one component of a comprehensive MRSA colonization surveillance program. It is not intended to diagnose MRSA infection nor to guide  or monitor treatment for MRSA infections.   Urine culture     Status: None   Collection Time: 01/28/15  5:23 PM  Result Value Ref Range Status   Specimen Description URINE, CLEAN CATCH  Final   Special Requests NONE  Final   Culture   Final    50,000 COLONIES/mL KLEBSIELLA PNEUMONIAE >=100,000 COLONIES/mL ESCHERICHIA COLI Results Called to: DONNISHA ROBERTSON AT 1212 ON 10/102/16 CTJ ESBL-EXTENDED SPECTRUM BETA LACTAMASE-THE ORGANISM IS RESISTANT TO PENICILLINS, CEPHALOSPORINS AND AZTREONAM ACCORDING TO CLSI M100-S15 VOL.[place].    Report Status 02/01/2015 FINAL  Final   Organism ID, Bacteria KLEBSIELLA PNEUMONIAE  Final   Organism ID, Bacteria ESCHERICHIA COLI  Final      Susceptibility   Escherichia coli - MIC*    AMPICILLIN >=32 RESISTANT Resistant     CEFAZOLIN >=64 RESISTANT Resistant     CEFTRIAXONE >=64 RESISTANT Resistant     CIPROFLOXACIN >=4 RESISTANT Resistant     GENTAMICIN <=1 SENSITIVE Sensitive     IMIPENEM <=0.25 SENSITIVE Sensitive     NITROFURANTOIN <=16 SENSITIVE Sensitive     TRIMETH/SULFA >=320 RESISTANT Resistant     Extended ESBL POSITIVE Resistant     PIP/TAZO Value in next row Sensitive      SENSITIVE<=4    ERTAPENEM Value in next row Sensitive  SENSITIVE<=0.5    * >=100,000 COLONIES/mL ESCHERICHIA COLI   Klebsiella pneumoniae - MIC*    AMPICILLIN Value in next row Resistant      SENSITIVE<=0.5    CEFAZOLIN Value in next row Sensitive      SENSITIVE<=0.5    CEFTRIAXONE Value in next row Sensitive      SENSITIVE<=0.5    CIPROFLOXACIN Value in next row Sensitive      SENSITIVE<=0.5    GENTAMICIN Value in next row Sensitive      SENSITIVE<=0.5    IMIPENEM Value in next row Sensitive      SENSITIVE<=0.5    NITROFURANTOIN Value in next row Intermediate      SENSITIVE<=0.5    TRIMETH/SULFA Value in next row Sensitive      SENSITIVE<=0.5    PIP/TAZO Value in next row Sensitive      SENSITIVE<=4    ERTAPENEM Value in next row  Sensitive      SENSITIVE<=0.5    * 50,000 COLONIES/mL KLEBSIELLA PNEUMONIAE  Urine culture     Status: None   Collection Time: 01/29/15  7:00 AM  Result Value Ref Range Status   Specimen Description URINE, CLEAN CATCH  Final   Special Requests NONE  Final   Culture   Final    >=100,000 COLONIES/mL ESCHERICHIA COLI Results Called to: BRANDY MANSFIELD AT 1349 01/31/15 CTJ ESBL-EXTENDED SPECTRUM BETA LACTAMASE-THE ORGANISM IS RESISTANT TO PENICILLINS, CEPHALOSPORINS AND AZTREONAM ACCORDING TO CLSI M100-S15 VOL.[place].    Report Status 01/31/2015 FINAL  Final   Organism ID, Bacteria ESCHERICHIA COLI  Final      Susceptibility   Escherichia coli - MIC*    AMPICILLIN >=32 RESISTANT Resistant     CEFAZOLIN >=64 RESISTANT Resistant     CEFTRIAXONE >=64 RESISTANT Resistant     CIPROFLOXACIN >=4 RESISTANT Resistant     GENTAMICIN <=1 SENSITIVE Sensitive     IMIPENEM <=0.25 SENSITIVE Sensitive     NITROFURANTOIN <=16 SENSITIVE Sensitive     TRIMETH/SULFA >=320 RESISTANT Resistant     Extended ESBL POSITIVE Resistant     PIP/TAZO Value in next row Sensitive      SENSITIVE<=4    * >=100,000 COLONIES/mL ESCHERICHIA COLI    Medical History: Past Medical History  Diagnosis Date  . Hypertension   . Arthritis     osteoarthritis  . Hyperlipidemia   . Temporal giant cell arteritis (HCC)     left eye  . Atrial fibrillation ([place])   . GERD (gastroesophageal reflux disease)   . CHF (congestive heart failure) ([place])   . Glaucoma   . DJD (degenerative joint disease)   . TIA (transient ischemic attack)   . Bell's palsy   . Cancer ([place])     skin    Medications:  Scheduled:  . aspirin  162.5 mg Oral Daily  . benazepril  20 mg Oral Daily  . conjugated estrogens  1 Applicatorful Vaginal Daily  . hydrALAZINE  25 mg Oral 3 times per day  . latanoprost  1 drop Right Eye QHS  . metoprolol succinate  25 mg Oral Daily  . oxybutynin  5 mg Oral QHS  . [START ON 02/07/2015] pantoprazole   40 mg Oral QAC breakfast  . potassium chloride  10 mEq Intravenous Q1 Hr x 4  . vitamin B-12  1,000 mcg Oral Daily  . [START ON 02/08/2015] warfarin  2 mg Oral Q Wed  . warfarin  3 mg Oral Once per day on Sun Mon  Tue Thu Fri Sat  . Warfarin - Pharmacist Dosing Inpatient   Does not apply q1800    Assessment: K = 3.3 Ca = 8.8, Albumin = 3.7 Corrected Ca = 9.3 Mag = 1.4   Goal of Therapy:  Normalization of K   Plan:  Will order KCl 10 mEq X 4 .  Magnesium sulfate 2 mg IV X 1 .  Will recheck electrolytes on 10/18 with AM labs.   Arieon Scalzo D 02/06/2015,9:57 PM

## 2015-02-06 NOTE — ED Notes (Signed)
Pt here via EMS from [facility] with c/o AMS per pts son starting at around 0300 this am. Pt was seen here 2 days ago and treated for low Na levels and UTI. Pt son states she hasn't been making sense early am, states he symptoms were similar 2 days ago. Pt appears in no distress, alert to self and place only. Pts son at bedside.

## 2015-02-06 NOTE — Consult Note (Signed)
ANTICOAGULATION CONSULT NOTE - Initial Consult  Pharmacy Consult for warfarin Indication: atrial fibrillation  Allergies  Allergen Reactions  . Coreg [Carvedilol]   . Erythromycin Base Nausea And Vomiting  . Penicillins Swelling    "swelling of throat" Has patient had a PCN reaction causing immediate rash, facial/tongue/throat swelling, SOB or lightheadedness with hypotension: Yes Has patient had a PCN reaction causing severe rash involving mucus membranes or skin necrosis: No Has patient had a PCN reaction that required hospitalization No Has patient had a PCN reaction occurring within the last 10 years: No If all of the above answers are "NO", then may proceed with Cephalosporin use.  . Vitamin D Analogs   . Cefuroxime Axetil Nausea Only and Rash  . Morphine And Related Rash    Patient Measurements: Height: 5\' 6"  (167.6 cm) Weight: 176 lb 6 oz (80.003 kg) IBW/kg (Calculated) : 59.3 Heparin Dosing Weight:   Vital Signs: Temp: 98.2 F (36.8 C) (10/17 1532) Temp Source: Oral (10/17 1532) BP: 179/58 mmHg (10/17 1532) Pulse Rate: 87 (10/17 1532)  Labs:  Recent Labs  02/04/15 0606 02/06/15 1012  HGB  --  11.2*  HCT  --  33.7*  PLT  --  174  LABPROT 33.1* 24.3*  INR 3.24 2.17  CREATININE  --  0.55  TROPONINI  --  0.05*    Estimated Creatinine Clearance: 44.9 mL/min (by C-G formula based on Cr of 0.55).   Medical History: Past Medical History  Diagnosis Date  . Hypertension   . Arthritis     osteoarthritis  . Hyperlipidemia   . Temporal giant cell arteritis (HCC)     left eye  . Atrial fibrillation ([place])   . GERD (gastroesophageal reflux disease)   . CHF (congestive heart failure) ([place])   . Glaucoma   . DJD (degenerative joint disease)   . TIA (transient ischemic attack)   . Bell's palsy   . Cancer ([place])     skin    Medications:  Scheduled:  . aspirin  162.5 mg Oral Daily  . benazepril  20 mg Oral Daily  . conjugated estrogens  1 Applicatorful  Vaginal Daily  . hydrALAZINE  25 mg Oral 3 times per day  . latanoprost  1 drop Right Eye QHS  . metoprolol succinate  25 mg Oral Daily  . nitrofurantoin (macrocrystal-monohydrate)  100 mg Oral BID  . oxybutynin  5 mg Oral QHS  . [START ON 02/07/2015] pantoprazole  40 mg Oral QAC breakfast  . potassium chloride  20 mEq Oral Once  . vitamin B-12  1,000 mcg Oral Daily  . [START ON 02/08/2015] warfarin  2 mg Oral Q Wed  . warfarin  3 mg Oral See admin instructions    Assessment: Pt is a 81 year old female with a hx of afib on warfarin at home. Pt was recently discharged from [facility] on the 15th, INR was supratherapeutic. Most likely a delayed drug interaction with antibiotics. INR is now therapeutic after a 2 day hold.  Goal of Therapy:  INR 2-3 Monitor platelets by anticoagulation protocol: Yes   Plan:  Pt is no longer on antibiotics. Will restart pt home dose of 3mg  daily except 2mg  on wed. Will recheck INR tomorrow morning. Pt currently not on antibiotics.  Melissa D Maccia 02/06/2015,4:24 PM

## 2015-02-06 NOTE — ED Notes (Signed)
Pt alert and oriented X 3.  Extremely hard of hearing.  Fine crackles left base.  Daughter at bedside.  On cardiac monitor in afib.  Swelling BLE.

## 2015-02-06 NOTE — ED Provider Notes (Signed)
Time Seen: Approximately ----------------------------------------- 10:02 AM on 02/06/2015 -----------------------------------------    I have reviewed the triage notes  Chief Complaint: Altered Mental Status   History of Present Illness: Joyce Vasquez is a 81 y.o. female who was just recently discharged from the hospital for altered mental status most likely secondary to urinary tract infection and hyponatremia. She also had some other electrolyte abnormalities. The family states she did well when she was discharged home and then starting last evening started having increased confusion patient has not had any nausea, vomiting, focal neurologic findings. Family describes some minor visual hallucinations at home. They state that she is more alert now than what she was at home. Currently she is able to follow some commands. She denies any specific pain. Family denies any syncopal or near syncopal episodes.  Family is the primary historians. Past Medical History  Diagnosis Date  . Hypertension   . Arthritis     osteoarthritis  . Hyperlipidemia   . Temporal giant cell arteritis (HCC)     left eye  . Atrial fibrillation ([place])   . GERD (gastroesophageal reflux disease)   . CHF (congestive heart failure) ([place])   . Glaucoma   . DJD (degenerative joint disease)   . TIA (transient ischemic attack)   . Bell's palsy   . Cancer [facility])     skin    Patient Active Problem List   Diagnosis Date Noted  . Hyponatremia 01/28/2015  . HTN (hypertension), malignant 12/04/2014  . Status post cardiac pacemaker procedure 11/29/2014    Past Surgical History  Procedure Laterality Date  . Carotid endarterectomy Right   . Appendectomy    . Abdominal hysterectomy    . Cholecystectomy    . Cardiac catheterization    . Joint replacement Bilateral     Right and Left knee Replacement  . Breast surgery Left     Excisional Breast Biopsy  . Tonsillectomy    . Eye surgery Right     Cataract  Extraction--right eye only  . Pacemaker insertion Left 11/29/2014    Procedure: INSERTION PACEMAKER;  Surgeon: Isaias Cowman, MD;  Location: [facility] ORS;  Service: Cardiovascular;  Laterality: Left;    Past Surgical History  Procedure Laterality Date  . Carotid endarterectomy Right   . Appendectomy    . Abdominal hysterectomy    . Cholecystectomy    . Cardiac catheterization    . Joint replacement Bilateral     Right and Left knee Replacement  . Breast surgery Left     Excisional Breast Biopsy  . Tonsillectomy    . Eye surgery Right     Cataract Extraction--right eye only  . Pacemaker insertion Left 11/29/2014    Procedure: INSERTION PACEMAKER;  Surgeon: Isaias Cowman, MD;  Location: [facility] ORS;  Service: Cardiovascular;  Laterality: Left;    Current Outpatient Rx  Name  Route  Sig  Dispense  Refill  . aspirin 325 MG tablet   Oral   Take 162.5 mg by mouth daily.         . benazepril (LOTENSIN) 20 MG tablet   Oral   Take 1 tablet (20 mg total) by mouth daily.   30 tablet   0   . bimatoprost (LUMIGAN) 0.03 % ophthalmic solution   Right Eye   Place 1 drop into the right eye at bedtime.         . conjugated estrogens (PREMARIN) vaginal cream   Vaginal   Place 1 Applicatorful vaginally daily.  42.5 g   2   . hydrALAZINE (APRESOLINE) 25 MG tablet   Oral   Take 1 tablet (25 mg total) by mouth every 8 (eight) hours.   60 tablet   0   . metoprolol succinate (TOPROL-XL) 25 MG 24 hr tablet   Oral   Take 25 mg by mouth daily. (replaces carvedilol)         . nitrofurantoin, macrocrystal-monohydrate, (MACROBID) 100 MG capsule   Oral   Take 1 capsule (100 mg total) by mouth 2 (two) times daily. Bid for 6 days, then 100 mg po QD for 3 month   30 capsule   2   . nitroGLYCERIN (NITROSTAT) 0.4 MG SL tablet   Sublingual   Place 0.4 mg under the tongue every 5 (five) minutes as needed for chest pain. After 3rd dose if no relief call 911         . omeprazole  (PRILOSEC) 20 MG capsule   Oral   Take 20 mg by mouth daily.         Marland Kitchen oxybutynin (DITROPAN-XL) 5 MG 24 hr tablet   Oral   Take 5 mg by mouth at bedtime.         . potassium chloride (KLOR-CON 10) 10 MEQ tablet   Oral   Take 10 mEq by mouth daily as needed. Take one whenever taking one furosemide as needed         . vitamin B-12 (CYANOCOBALAMIN) 1000 MCG tablet   Oral   Take 1,000 mcg by mouth daily.         Marland Kitchen warfarin (COUMADIN) 2 MG tablet   Oral   Take 2 mg by mouth every Wednesday. Note dose         . warfarin (COUMADIN) 3 MG tablet   Oral   Take 3 mg by mouth See admin instructions. Take 1 tablet orally every day except on Wednesday take 2mg  tablet.           Allergies:  Coreg; Erythromycin base; Penicillins; Vitamin d analogs; Cefuroxime axetil; and Morphine and related  Family History: No family history on file.  Social History: Social History  Substance Use Topics  . Smoking status: Never Smoker   . Smokeless tobacco: Never Used  . Alcohol Use: No     Review of Systems:   10 point review of systems was performed and was otherwise negative:  Constitutional: No fever Eyes: No visual disturbances ENT: No sore throat, ear pain Cardiac: No chest pain Respiratory: No shortness of breath, wheezing, or stridor Abdomen: No abdominal pain, no vomiting, No diarrhea Endocrine: No weight loss, No night sweats Extremities: Bilateral circumferential peripheral edema (unchanged according to the family), cyanosis Skin: No rashes, easy bruising Neurologic: No focal weakness, trouble with speech or swollowing Urologic: No dysuria, Hematuria, or urinary frequency   Physical Exam:  ED Triage Vitals  Enc Vitals Group     BP 02/06/15 0944 162/81 mmHg     Pulse Rate 02/06/15 0944 68     Resp 02/06/15 0944 18     Temp 02/06/15 0945 97.2 F (36.2 C)     Temp Source 02/06/15 0945 Oral     SpO2 02/06/15 0944 97 %     Weight 02/06/15 0944 179 lb 0.2 oz  (81.2 kg)     Height 02/06/15 0944 5\' 6"  (1.676 m)     Head Cir --      Peak Flow --      Pain Score --  Pain Loc --      Pain Edu? --      Excl. in [place]? --     General: Awake , Alert , and Oriented times 3; GCS 15 Head: Normal cephalic , atraumatic Eyes: Pupils equal , round, reactive to light Nose/Throat: No nasal drainage, patent upper airway without erythema or exudate.  Neck: Supple, Full range of motion, No anterior adenopathy or palpable thyroid masses Lungs: Clear to ascultation without wheezes , rhonchi, or rales Heart: Regular rate, regular rhythm without murmurs , gallops , or rubs Abdomen: Soft, non tender without rebound, guarding , or rigidity; bowel sounds positive and symmetric in all 4 quadrants. No organomegaly .        Extremities: 2 plus symmetric pulses. No edema, clubbing or cyanosis Neurologic: normal ambulation, Motor symmetric without deficits, sensory intact Skin: warm, dry, no rashes   Labs:   All laboratory work was reviewed including any pertinent negatives or positives listed below:  Labs Reviewed  CBC WITH DIFFERENTIAL/PLATELET  COMPREHENSIVE METABOLIC PANEL  URINALYSIS COMPLETEWITH MICROSCOPIC ([place])  TROPONIN I  PROTIME-INR  MAGNESIUM   review laboratory work showed numerous electrolyte abnormalities including hyponatremia, hypokalemia, and hypomagnesemia  EKG:  ED ECG REPORT I, Daymon Larsen, the attending physician, personally viewed and interpreted this ECG.  Date: 02/06/2015 EKG Time: 936 Rate: 81 Rhythm: Atrial fibrillation with frequent multifocal PACs QRS Axis: normal Intervals: Right bundle-branch block ST/T Wave abnormalities: Prolonged QT interval Conduction Disutrbances: none Narrative Interpretation: unremarkable Left ventricular hypertrophy     ED Course:  The patient had correction of her numerous electrolyte abnormalities with magnesium, oral potassium, and an IV normal saline bolus. Due to her altered  mental status it was determined that she would require inpatient management once again.   Assessment:  Hypomagnesemia Hyponatremia Hypokalemic M      Plan:  Inpatient management I spoke to the hospitalist team, further disposition and management depends upon their evaluation.            Daymon Larsen, MD 02/06/15 [phone]

## 2015-02-07 LAB — BASIC METABOLIC PANEL
Anion gap: 7 (ref 5–15)
BUN: 10 mg/dL (ref 6–20)
CO2: 26 mmol/L (ref 22–32)
Calcium: 8.6 mg/dL — ABNORMAL LOW (ref 8.9–10.3)
Chloride: 97 mmol/L — ABNORMAL LOW (ref 101–111)
Creatinine, Ser: 0.56 mg/dL (ref 0.44–1.00)
GFR calc Af Amer: >60 mL/min (ref >60)
GLUCOSE: 112 mg/dL — AB (ref 65–99)
POTASSIUM: 3.9 mmol/L (ref 3.5–5.1)
Sodium: 130 mmol/L — ABNORMAL LOW (ref 135–145)

## 2015-02-07 LAB — PROTIME-INR
INR: 2.27
Prothrombin Time: 25.2 seconds — ABNORMAL HIGH (ref 11.4–15.0)

## 2015-02-07 LAB — MAGNESIUM: Magnesium: 1.9 mg/dL (ref 1.7–2.4)

## 2015-02-07 MED ORDER — CALCIUM CARBONATE ANTACID 500 MG PO CHEW
1.0000 | CHEWABLE_TABLET | Freq: Two times a day (BID) | ORAL | Status: DC
  Administered 2015-02-07: 11:00:00 200 mg via ORAL
  Filled 2015-02-07: qty 1

## 2015-02-07 MED ORDER — NITROFURANTOIN MONOHYD MACRO 100 MG PO CAPS
100.0000 mg | ORAL_CAPSULE | Freq: Two times a day (BID) | ORAL | Status: AC
Start: 2015-02-07 — End: 2015-02-13

## 2015-02-07 MED ORDER — NITROFURANTOIN MONOHYD MACRO 100 MG PO CAPS
100.0000 mg | ORAL_CAPSULE | Freq: Two times a day (BID) | ORAL | Status: DC
  Administered 2015-02-07: 11:00:00 100 mg via ORAL
  Filled 2015-02-07 (×2): qty 1

## 2015-02-07 MED ORDER — ENSURE ENLIVE PO LIQD
237.0000 mL | Freq: Three times a day (TID) | ORAL | Status: DC
  Administered 2015-02-07: 11:00:00 237 mL via ORAL

## 2015-02-07 MED ORDER — MAGNESIUM OXIDE -MG SUPPLEMENT 400 (240 MG) MG PO TABS: 400.0000 mg | ORAL_TABLET | Freq: Every day | ORAL | Status: AC

## 2015-02-07 MED ORDER — CALCIUM CARBONATE ANTACID 500 MG PO CHEW: 1.0000 | CHEWABLE_TABLET | Freq: Two times a day (BID) | ORAL | Status: AC

## 2015-02-07 MED ORDER — CIPROFLOXACIN HCL 250 MG PO TABS: 250.0000 mg | ORAL_TABLET | Freq: Two times a day (BID) | ORAL | Status: DC

## 2015-02-07 NOTE — Evaluation (Signed)
Physical Therapy Evaluation Patient Details Name: Joyce Vasquez MRN: [id number] DOB: 08-29-1916 Today's Date: 02/07/2015   History of Present Illness  Joyce Vasquez is a 81 y.o. female with a known history of chronic A. fib on Coumadin, hypertension, hyperlipidemia, glaucoma who was just discharged 02/04/2015 after she was admitted for acute hyponatremia hypomagnesemia and hypo kalemia.patient was discharged to [facility]. Her hydrochlorothiazide and Lasix were discontinued.   Clinical Impression  Pt appears to be slightly confused currently, has difficulty consistently following commands (could also be complicated due to being [facility]). Pt is mod independent with bed mobility; using bed rails for assistance with supine<>sitting transfer. Pt is min guard for sit<>stand transfer and gait; both using RW for BUE assistance. Pt ambulated x 75 ft before becoming fatigued; no instances of imbalance noted but decreased gait speed would suggest pt is at an increased risk of falls. Currently recommending d/c to SNF due to pt requiring increased level of caregiver support with ADLs such as bathing and dressing; as well as to monitor level of confusion in order to minimize risk of falls.     Follow Up Recommendations SNF    Equipment Recommendations   (pt already has 4ww)    Recommendations for Other Services       Precautions / Restrictions Precautions Precautions: Fall Restrictions Weight Bearing Restrictions: No      Mobility  Bed Mobility Overal bed mobility: Modified Independent             General bed mobility comments: Pt used bed rails for assistance with transfer for supine <>sitting  Transfers Overall transfer level: Needs assistance Equipment used: Rolling walker (2 wheeled) Transfers: Sit to/from Stand Sit to Stand: Min guard         General transfer comment: Pt able to complete sit<>stand transfer on first attempt with CGA  Ambulation/Gait Ambulation/Gait assistance:  Min guard Ambulation Distance (Feet): 75 Feet Assistive device: Rolling walker (2 wheeled) Gait Pattern/deviations: Step-through pattern;Wide base of support Gait velocity: decreased Gait velocity interpretation: <1.8 ft/sec, indicative of risk for recurrent falls General Gait Details: decreased B step length/foot clearance/heelstrike but steady without any loss of balance; no significant fatigue noted  Stairs            Wheelchair Mobility    Modified Rankin (Stroke Patients Only)       Balance Overall balance assessment: Needs assistance Sitting-balance support: Bilateral upper extremity supported;Feet supported Sitting balance-Leahy Scale: Good     Standing balance support: Bilateral upper extremity supported;During functional activity Standing balance-Leahy Scale: Good                               Pertinent Vitals/Pain Pain Assessment: No/denies pain    Home Living Family/patient expects to be discharged to:: Skilled nursing facility (currently resides at an independent living facility; [facility] ) Living Arrangements: Alone             Home Equipment: Walker - 4 wheels Additional Comments: pt independent with ambulation and assistive device; gets help with changing bed dressing and meals are provided at dining hall. No assistance for dressing and bathing.     Prior Function Level of Independence: Independent with assistive device(s)         Comments: Pt is independent with her rollator at [facility] where she often helps with a weekly exercise class.     Hand Dominance        Extremity/Trunk  Assessment   Upper Extremity Assessment: Overall WFL for tasks assessed           Lower Extremity Assessment: Generalized weakness (general BLE strength 3+/5 )         Communication   Communication: HOH  Cognition Arousal/Alertness: Awake/alert Behavior During Therapy: WFL for tasks assessed/performed Overall Cognitive Status:  Impaired/Different from baseline (mild confusion noted; trouble with following commands)                      General Comments      Exercises Total Joint Exercises Ankle Circles/Pumps: AROM;Both;10 reps;Supine Straight Leg Raises: AROM;Strengthening;Both;10 reps;Supine Knee Flexion: AROM;Supine;Both;10 reps      Assessment/Plan    PT Assessment Patient needs continued PT services  PT Diagnosis Generalized weakness;Difficulty walking   PT Problem List Decreased strength;Decreased activity tolerance;Decreased mobility;Decreased cognition  PT Treatment Interventions DME instruction;Gait training;Functional mobility training;Therapeutic activities;Therapeutic exercise;Balance training   PT Goals (Current goals can be found in the Care Plan section) Acute Rehab PT Goals Patient Stated Goal: to be able to walk better PT Goal Formulation: With patient Time For Goal Achievement: 02/21/15 Potential to Achieve Goals: Good    Frequency Min 2X/week   Barriers to discharge Decreased caregiver support      Co-evaluation               End of Session Equipment Utilized During Treatment: Gait belt Activity Tolerance: Patient tolerated treatment well Patient left: in chair;with family/visitor present;with call bell/phone within reach;with chair alarm set Nurse Communication: Mobility status         Time: 0912-0940 PT Time Calculation (min) (ACUTE ONLY): 28 min   Charges:         PT G CodesMilon Score 02/07/2015, 10:17 AM

## 2015-02-07 NOTE — Discharge Summary (Signed)
[place] [facility] [place] NAME: Joyce Vasquez    MR#:  [id number]  DATE OF BIRTH:  July 18, 1916  DATE OF ADMISSION:  02/06/2015 ADMITTING PHYSICIAN: Fritzi Mandes, MD  DATE OF DISCHARGE: 02/07/2015    PRIMARY CARE PHYSICIAN: Chrisandra Carota, MD    ADMISSION DIAGNOSIS:  Hyponatremia syndrome [E87.1]  DISCHARGE DIAGNOSIS:  Active Problems:   Hypomagnesemia   SECONDARY DIAGNOSIS:   Past Medical History  Diagnosis Date  . Hypertension   . Arthritis     osteoarthritis  . Hyperlipidemia   . Temporal giant cell arteritis (HCC)     left eye  . Atrial fibrillation ([place])   . GERD (gastroesophageal reflux disease)   . CHF (congestive heart failure) ([place])   . Glaucoma   . DJD (degenerative joint disease)   . TIA (transient ischemic attack)   . Bell's palsy   . Cancer [facility])     skin    HOSPITAL COURSE:  81 y.o. female with a known history of chronic A. fib on Coumadin, hypertension, hyperlipidemia, glaucoma who was just discharged 02/04/2015 after she was admitted for acute hyponatremia hypomagnesemia and hypokalemia and ESBL UTI presents with weakness found to have electrolyte abnormalities.  1. Acute hyponatremia, hypomagnesemia, hypokalemia: Her electrolytes were repleted and have improved. She will need daily Mg and K along with weekly BMP and MG/phos levels checked. I suspect this could be related to overall decreased PO intake as reported by daughter as well as having been on HCTZ in the past may have decreased her stores.   2. Generalized weakness, deconditioning: She will require SNF.  3. Hypertension continue benazepril  4. Chronic A. Fib Continue metoprolol and Coumadin. She needs daily INR check goal INR 2-3. 5. Recent ESBL UTI: She will need treatment until 02/13/15 with [facility]    DISCHARGE CONDITIONS AND DIET:  Stable condition Regular diet  CONSULTS OBTAINED:     DRUG ALLERGIES:   Allergies  Allergen  Reactions  . Coreg [Carvedilol]   . Erythromycin Base Nausea And Vomiting  . Penicillins Swelling    "swelling of throat" Has patient had a PCN reaction causing immediate rash, facial/tongue/throat swelling, SOB or lightheadedness with hypotension: Yes Has patient had a PCN reaction causing severe rash involving mucus membranes or skin necrosis: No Has patient had a PCN reaction that required hospitalization No Has patient had a PCN reaction occurring within the last 10 years: No If all of the above answers are "NO", then may proceed with Cephalosporin use.  . Vitamin D Analogs   . Cefuroxime Axetil Nausea Only and Rash  . Morphine And Related Rash    DISCHARGE MEDICATIONS:   Current Discharge Medication List    START taking these medications   Details  calcium carbonate (TUMS - DOSED IN MG ELEMENTAL CALCIUM) 500 MG chewable tablet Chew 1 tablet (200 mg of elemental calcium total) by mouth 2 (two) times daily with a meal. Qty: 120 tablet, Refills: 0    Magnesium Oxide 400 (240 MG) MG TABS Take 400 mg by mouth daily. Qty: 30 tablet, Refills: 0      CONTINUE these medications which have CHANGED   Details  nitrofurantoin, macrocrystal-monohydrate, (MACROBID) 100 MG capsule Take 1 capsule (100 mg total) by mouth 2 (two) times daily. Bid for 6 days, then 100 mg po QD for 3 month Qty: 30 capsule, Refills: 2      CONTINUE these medications which have NOT CHANGED   Details  aspirin  325 MG tablet Take 162.5 mg by mouth daily.    benazepril (LOTENSIN) 20 MG tablet Take 1 tablet (20 mg total) by mouth daily. Qty: 30 tablet, Refills: 0    bimatoprost (LUMIGAN) 0.03 % ophthalmic solution Place 1 drop into the right eye at bedtime.    conjugated estrogens (PREMARIN) vaginal cream Place 1 Applicatorful vaginally daily. Qty: 42.5 g, Refills: 2    hydrALAZINE (APRESOLINE) 25 MG tablet Take 1 tablet (25 mg total) by mouth every 8 (eight) hours. Qty: 60 tablet, Refills: 0    metoprolol  succinate (TOPROL-XL) 25 MG 24 hr tablet Take 25 mg by mouth daily.     nitroGLYCERIN (NITROSTAT) 0.4 MG SL tablet Place 0.4 mg under the tongue every 5 (five) minutes as needed for chest pain. After 3rd dose if no relief call 911    omeprazole (PRILOSEC) 20 MG capsule Take 20 mg by mouth daily.    oxybutynin (DITROPAN-XL) 5 MG 24 hr tablet Take 5 mg by mouth at bedtime.    potassium chloride (KLOR-CON 10) 10 MEQ tablet Take 10 mEq by mouth daily as needed (when taking furosemide).     vitamin B-12 (CYANOCOBALAMIN) 1000 MCG tablet Take 1,000 mcg by mouth daily.    !! warfarin (COUMADIN) 2 MG tablet Take 2 mg by mouth every Wednesday. Note dose    !! warfarin (COUMADIN) 3 MG tablet Take 3 mg by mouth See admin instructions. Take 1 tablet orally every day except on Wednesday take 2mg  tablet.     !! - Potential duplicate medications found. Please discuss with provider.            Today   CHIEF COMPLAINT:  Patient doing well this am some generalized weakness, but no chest pain or SOB   VITAL SIGNS:  Blood pressure 147/68, pulse 71, temperature 98.2 F (36.8 C), temperature source Oral, resp. rate 20, height 5\' 6"  (1.676 m), weight 80.003 kg (176 lb 6 oz), SpO2 98 %.   REVIEW OF SYSTEMS:  Review of Systems  Constitutional: Negative for fever, chills and malaise/fatigue.  HENT: Negative for sore throat.   Eyes: Negative for blurred vision.  Respiratory: Negative for cough, hemoptysis, shortness of breath and wheezing.   Cardiovascular: Negative for chest pain, palpitations and leg swelling.  Gastrointestinal: Negative for nausea, vomiting, abdominal pain, diarrhea and blood in stool.  Genitourinary: Negative for dysuria.  Musculoskeletal: Negative for back pain.  Neurological: Positive for weakness. Negative for dizziness, tremors and headaches.  Endo/Heme/Allergies: Does not bruise/bleed easily.     PHYSICAL EXAMINATION:  GENERAL:  81 y.o.-year-old patient lying in  the bed with no acute distress.  NECK:  Supple, no jugular venous distention. No thyroid enlargement, no tenderness.  LUNGS: Normal breath sounds bilaterally, no wheezing, rales,rhonchi  No use of accessory muscles of respiration.  CARDIOVASCULAR: S1, S2 normal. No murmurs, rubs, or gallops.  ABDOMEN: Soft, non-tender, non-distended. Bowel sounds present. No organomegaly or mass.  EXTREMITIES: No pedal edema, cyanosis, or clubbing.  PSYCHIATRIC: The patient is alert and oriented x 3.  SKIN: No obvious rash, lesion, or ulcer.   DATA REVIEW:   CBC  Recent Labs Lab 02/06/15 1012  WBC 8.1  HGB 11.2*  HCT 33.7*  PLT 174    Chemistries   Recent Labs Lab 02/06/15 1012  02/07/15 0339  NA 127*  < > 130*  K 2.9*  < > 3.9  CL 91*  < > 97*  CO2 25  < > 26  GLUCOSE  123*  < > 112*  BUN 11  < > 10  CREATININE 0.55  < > 0.56  CALCIUM 9.1  < > 8.6*  MG 1.0*  < > 1.9  AST 26  --   --   ALT 19  --   --   ALKPHOS 87  --   --   BILITOT 1.2  --   --   < > = values in this interval not displayed.  Cardiac Enzymes  Recent Labs Lab 02/06/15 1012  TROPONINI 0.05*    Microbiology Results  @MICRORSLT48 @  RADIOLOGY:  No results found.    Management plans discussed with the patient and she is in agreement. Stable for discharge SNF  Patient should follow up with PCP in 1 week She needs INR checked daily as well as weekly BMP with mg and PHOS  CODE STATUS:     Code Status Orders        Start     Ordered   02/06/15 1548  Do not attempt resuscitation (DNR)   Continuous    Question Answer Comment  In the event of cardiac or respiratory ARREST Do not call a "code blue"   In the event of cardiac or respiratory ARREST Do not perform Intubation, CPR, defibrillation or ACLS   In the event of cardiac or respiratory ARREST Use medication by any route, position, wound care, and other measures to relive pain and suffering. May use oxygen, suction and manual treatment of airway  obstruction as needed for comfort.      02/06/15 1548    Advance Directive Documentation        Most Recent Value   Type of Advance Directive  Healthcare Power of Attorney   Pre-existing out of facility DNR order (yellow form or pink MOST form)     "MOST" Form in Place?        TOTAL TIME TAKING CARE OF THIS PATIENT: 35 minutes.    Note: This dictation was prepared with Dragon dictation along with smaller phrase technology. Any transcriptional errors that result from this process are unintentional.  Peyten Weare M.D on 02/07/2015 at 9:55 AM  Between 7am to 6pm - Pager - [phone] After 6pm go to [email or web address] - password [email or web address]  Office  [phone]  CC: Primary care physician; Chrisandra Carota, MD

## 2015-02-07 NOTE — Care Management (Signed)
Admitted to [facility] with the diagnosis of hypomagnesemia. Discharged from this facility 02/04/15 back to [facility] Daughter is Lyann Hagstrom [phone]). Son s Sonia Side [phone]). Lived at [facility] x 5 years. [facility] is [facility] that was arranged prior to discharge. [facility] for skilled nursing many years ago following knee surgery. Takes care of all basic activities of daily living herself. Daughter helps with instrumental activities of daily living. No falls. Good appetite. Only has maid services at [facility]. Last seen Dr. Vickki Muff October 7th. Physical therapy evaluation pending. Daughter would like [facility], if skilled nursing is recommended. Shelbie Ammons RN MSN Care Management [phone]

## 2015-02-07 NOTE — Progress Notes (Signed)
Initial Nutrition Assessment   INTERVENTION:   Meals and Snacks: Cater to patient preferences Medical Food Supplement Therapy: will recommend Ensure Enlive po TID, each supplement provides 350 kcal and 20 grams of protein   NUTRITION DIAGNOSIS:   Inadequate oral intake related to poor appetite as evidenced by per patient/family report.  GOAL:   Patient will meet greater than or equal to 90% of their needs  MONITOR:    (Energy Intake, Electrolyte and Renal Profile, Anthropometrics, Digestive system)  REASON FOR ASSESSMENT:   Consult Poor PO  ASSESSMENT:   Pt admitted from [facility] with AMS and hyponatremia, hypomagnesemia, and hypokalemia. Pt recently discharged from [facility] on 10/15 with the same. Pt remains on isolation. Pt with possible discharge today.  Past Medical History  Diagnosis Date  . Hypertension   . Arthritis     osteoarthritis  . Hyperlipidemia   . Temporal giant cell arteritis (HCC)     left eye  . Atrial fibrillation ([place])   . GERD (gastroesophageal reflux disease)   . CHF (congestive heart failure) ([place])   . Glaucoma   . DJD (degenerative joint disease)   . TIA (transient ischemic attack)   . Bell's palsy   . Cancer ([place])     skin     Diet Order:  Diet regular Room service appropriate?: Yes; Fluid consistency:: Thin    Current Nutrition: Pt ate roughly 25% of breakfast this am.   Food/Nutrition-Related History: Per family pt eats less since admission to [facility] mainly as pt does not like food there. Pt family member reports pt skips meals all together and averages 45% of the time does not eat meal served. Pt does not normally drink supplement drinks.   Scheduled Medications:  . aspirin  162.5 mg Oral Daily  . benazepril  20 mg Oral Daily  . calcium carbonate  1 tablet Oral BID WC  . conjugated estrogens  1 Applicatorful Vaginal Daily  . feeding supplement (ENSURE ENLIVE)  237 mL Oral TID WC  . hydrALAZINE  25 mg Oral 3 times per day   . latanoprost  1 drop Right Eye QHS  . metoprolol succinate  25 mg Oral Daily  . nitrofurantoin (macrocrystal-monohydrate)  100 mg Oral Q12H  . oxybutynin  5 mg Oral QHS  . pantoprazole  40 mg Oral QAC breakfast  . vitamin B-12  1,000 mcg Oral Daily  . [START ON 02/08/2015] warfarin  2 mg Oral Q Wed  . warfarin  3 mg Oral Once per day on Sun Mon Tue Thu Fri Sat  . Warfarin - Pharmacist Dosing Inpatient   Does not apply q1800    Continuous Medications:  . 0.9 % NaCl with KCl 40 mEq / L Stopped (02/07/15 1111)     Electrolyte/Renal Profile and Glucose Profile:   Recent Labs Lab 02/06/15 1012 02/06/15 2023 02/07/15 0339  NA 127* 129* 130*  K 2.9* 3.3* 3.9  CL 91* 95* 97*  CO2 25 25 26   BUN 11 10 10   CREATININE 0.55 0.50 0.56  CALCIUM 9.1 8.8* 8.6*  MG 1.0* 1.4* 1.9  GLUCOSE 123* 124* 112*   Protein Profile:  Recent Labs Lab 02/06/15 1012  ALBUMIN 3.7    Gastrointestinal Profile: Last BM:  02/07/2015   Nutrition-Focused Physical Exam Findings: Nutrition-Focused physical exam completed. Findings are WDL for fat depletion, muscle depletion, and edema.    Weight Change: Pt family reports 174lbs at [facility] outpatient office 2 weeks ago. RD notes per [facility] weight  gain since August. Family reports UBW of 168lbs.   Height:   Ht Readings from Last 1 Encounters:  02/06/15 5\' 6"  (1.676 m)    Weight:   Wt Readings from Last 1 Encounters:  02/06/15 176 lb 6 oz (80.003 kg)    Wt Readings from Last 10 Encounters:  02/06/15 176 lb 6 oz (80.003 kg)  01/28/15 174 lb (78.926 kg)  12/05/14 164 lb 14.4 oz (74.798 kg)  11/29/14 165 lb 3.2 oz (74.934 kg)  11/23/14 172 lb (78.019 kg)     BMI:  Body mass index is 28.48 kg/(m^2).  Estimated Nutritional Needs:   Kcal:  BEE: 1212kcals, TEE: (IF 1.1-1.3)(AF 1.2) 1600-1890kcals  Protein:  64-80g protein (0.8-1.0g/kg)   Fluid:  2000-2450mL of fluid (25-59mL/kG)  EDUCATION NEEDS:   Education needs no appropriate at this  time   [place], RD, LDN Pager [phone]

## 2015-02-07 NOTE — Progress Notes (Signed)
Called report to [facility].  Pt IV remover per policy and pt transported via car by her daughter.  Clarise Cruz, RN

## 2015-02-07 NOTE — Plan of Care (Signed)
Problem: Discharge Progression Outcomes Goal: Other Discharge Outcomes/Goals Plan of care progress to goal: - Continues on IV fluids. - Complained of headache, tylenol given with improvement. - IV mag and IV potassium received this shift. - Incontinent of urine. Will continue to monitor.

## 2015-02-07 NOTE — Progress Notes (Signed)
MEDICATION RELATED CONSULT NOTE - INITIAL   Pharmacy Consult for Electrolytes Indication: Hypomagnesia, hypokalemia  Allergies  Allergen Reactions  . Coreg [Carvedilol]   . Erythromycin Base Nausea And Vomiting  . Penicillins Swelling    "swelling of throat" Has patient had a PCN reaction causing immediate rash, facial/tongue/throat swelling, SOB or lightheadedness with hypotension: Yes Has patient had a PCN reaction causing severe rash involving mucus membranes or skin necrosis: No Has patient had a PCN reaction that required hospitalization No Has patient had a PCN reaction occurring within the last 10 years: No If all of the above answers are "NO", then may proceed with Cephalosporin use.  . Vitamin D Analogs   . Cefuroxime Axetil Nausea Only and Rash  . Morphine And Related Rash    Patient Measurements: Height: 5\' 6"  (167.6 cm) Weight: 176 lb 6 oz (80.003 kg) IBW/kg (Calculated) : 59.3 Adjusted Body Weight:   Vital Signs: Temp: 98.2 F (36.8 C) (10/18 0506) Temp Source: Oral (10/18 0506) BP: 147/68 mmHg (10/18 0506) Pulse Rate: 71 (10/18 0506) Intake/Output from previous day:   Intake/Output from this shift:    Labs:  Recent Labs  02/06/15 1012 02/06/15 2023 02/07/15 0339  WBC 8.1  --   --   HGB 11.2*  --   --   HCT 33.7*  --   --   PLT 174  --   --   CREATININE 0.55 0.50 0.56  MG 1.0* 1.4* 1.9  ALBUMIN 3.7  --   --   PROT 6.5  --   --   AST 26  --   --   ALT 19  --   --   ALKPHOS 87  --   --   BILITOT 1.2  --   --    Estimated Creatinine Clearance: 44.9 mL/min (by C-G formula based on Cr of 0.56).   Microbiology: Recent Results (from the past 720 hour(s))  MRSA PCR Screening     Status: None   Collection Time: 01/28/15  3:40 PM  Result Value Ref Range Status   MRSA by PCR NEGATIVE NEGATIVE Final    Comment:        The GeneXpert MRSA Assay (FDA approved for NASAL specimens only), is one component of a comprehensive MRSA  colonization surveillance program. It is not intended to diagnose MRSA infection nor to guide or monitor treatment for MRSA infections.   Urine culture     Status: None   Collection Time: 01/28/15  5:23 PM  Result Value Ref Range Status   Specimen Description URINE, CLEAN CATCH  Final   Special Requests NONE  Final   Culture   Final    50,000 COLONIES/mL KLEBSIELLA PNEUMONIAE >=100,000 COLONIES/mL ESCHERICHIA COLI Results Called to: DONNISHA ROBERTSON AT 1212 ON 10/102/16 CTJ ESBL-EXTENDED SPECTRUM BETA LACTAMASE-THE ORGANISM IS RESISTANT TO PENICILLINS, CEPHALOSPORINS AND AZTREONAM ACCORDING TO CLSI M100-S15 VOL.[place].    Report Status 02/01/2015 FINAL  Final   Organism ID, Bacteria KLEBSIELLA PNEUMONIAE  Final   Organism ID, Bacteria ESCHERICHIA COLI  Final      Susceptibility   Escherichia coli - MIC*    AMPICILLIN >=32 RESISTANT Resistant     CEFAZOLIN >=64 RESISTANT Resistant     CEFTRIAXONE >=64 RESISTANT Resistant     CIPROFLOXACIN >=4 RESISTANT Resistant     GENTAMICIN <=1 SENSITIVE Sensitive     IMIPENEM <=0.25 SENSITIVE Sensitive     NITROFURANTOIN <=16 SENSITIVE Sensitive     TRIMETH/SULFA >=320 RESISTANT  Resistant     Extended ESBL POSITIVE Resistant     PIP/TAZO Value in next row Sensitive      SENSITIVE<=4    ERTAPENEM Value in next row Sensitive      SENSITIVE<=0.5    * >=100,000 COLONIES/mL ESCHERICHIA COLI   Klebsiella pneumoniae - MIC*    AMPICILLIN Value in next row Resistant      SENSITIVE<=0.5    CEFAZOLIN Value in next row Sensitive      SENSITIVE<=0.5    CEFTRIAXONE Value in next row Sensitive      SENSITIVE<=0.5    CIPROFLOXACIN Value in next row Sensitive      SENSITIVE<=0.5    GENTAMICIN Value in next row Sensitive      SENSITIVE<=0.5    IMIPENEM Value in next row Sensitive      SENSITIVE<=0.5    NITROFURANTOIN Value in next row Intermediate      SENSITIVE<=0.5    TRIMETH/SULFA Value in next row Sensitive      SENSITIVE<=0.5     PIP/TAZO Value in next row Sensitive      SENSITIVE<=4    ERTAPENEM Value in next row Sensitive      SENSITIVE<=0.5    * 50,000 COLONIES/mL KLEBSIELLA PNEUMONIAE  Urine culture     Status: None   Collection Time: 01/29/15  7:00 AM  Result Value Ref Range Status   Specimen Description URINE, CLEAN CATCH  Final   Special Requests NONE  Final   Culture   Final    >=100,000 COLONIES/mL ESCHERICHIA COLI Results Called to: BRANDY MANSFIELD AT 1349 01/31/15 CTJ ESBL-EXTENDED SPECTRUM BETA LACTAMASE-THE ORGANISM IS RESISTANT TO PENICILLINS, CEPHALOSPORINS AND AZTREONAM ACCORDING TO CLSI M100-S15 VOL.[place].    Report Status 01/31/2015 FINAL  Final   Organism ID, Bacteria ESCHERICHIA COLI  Final      Susceptibility   Escherichia coli - MIC*    AMPICILLIN >=32 RESISTANT Resistant     CEFAZOLIN >=64 RESISTANT Resistant     CEFTRIAXONE >=64 RESISTANT Resistant     CIPROFLOXACIN >=4 RESISTANT Resistant     GENTAMICIN <=1 SENSITIVE Sensitive     IMIPENEM <=0.25 SENSITIVE Sensitive     NITROFURANTOIN <=16 SENSITIVE Sensitive     TRIMETH/SULFA >=320 RESISTANT Resistant     Extended ESBL POSITIVE Resistant     PIP/TAZO Value in next row Sensitive      SENSITIVE<=4    * >=100,000 COLONIES/mL ESCHERICHIA COLI    Medical History: Past Medical History  Diagnosis Date  . Hypertension   . Arthritis     osteoarthritis  . Hyperlipidemia   . Temporal giant cell arteritis (HCC)     left eye  . Atrial fibrillation ([place])   . GERD (gastroesophageal reflux disease)   . CHF (congestive heart failure) ([place])   . Glaucoma   . DJD (degenerative joint disease)   . TIA (transient ischemic attack)   . Bell's palsy   . Cancer ([place])     skin    Medications:  Scheduled:  . aspirin  162.5 mg Oral Daily  . benazepril  20 mg Oral Daily  . conjugated estrogens  1 Applicatorful Vaginal Daily  . hydrALAZINE  25 mg Oral 3 times per day  . latanoprost  1 drop Right Eye QHS  . metoprolol succinate   25 mg Oral Daily  . oxybutynin  5 mg Oral QHS  . pantoprazole  40 mg Oral QAC breakfast  . vitamin B-12  1,000 mcg Oral Daily  . [  START ON 02/08/2015] warfarin  2 mg Oral Q Wed  . warfarin  3 mg Oral Once per day on Sun Mon Tue Thu Fri Sat  . Warfarin - Pharmacist Dosing Inpatient   Does not apply q1800    Assessment: K = 3.3 Ca = 8.8, Albumin = 3.7 Corrected Ca = 9.3 Mag = 1.4   Goal of Therapy:  Normalization of K   Plan:  Will order KCl 10 mEq X 4 .  Magnesium sulfate 2 mg IV X 1 .  Will recheck electrolytes on 10/18 with AM labs.   10/18 Electrolytes WNL or normalizing. On NS w/40K @ 75 mL/hr. Corrected calcium 8.8. Will order CaCO3 500 mg x 2 doses.  Recheck BMP in AM.  Avin Gibbons S 02/07/2015,5:59 AM

## 2015-02-07 NOTE — Consult Note (Signed)
ANTICOAGULATION CONSULT NOTE - Initial Consult  Pharmacy Consult for warfarin Indication: atrial fibrillation  Allergies  Allergen Reactions  . Coreg [Carvedilol]   . Erythromycin Base Nausea And Vomiting  . Penicillins Swelling    "swelling of throat" Has patient had a PCN reaction causing immediate rash, facial/tongue/throat swelling, SOB or lightheadedness with hypotension: Yes Has patient had a PCN reaction causing severe rash involving mucus membranes or skin necrosis: No Has patient had a PCN reaction that required hospitalization No Has patient had a PCN reaction occurring within the last 10 years: No If all of the above answers are "NO", then may proceed with Cephalosporin use.  . Vitamin D Analogs   . Cefuroxime Axetil Nausea Only and Rash  . Morphine And Related Rash    Patient Measurements: Height: 5\' 6"  (167.6 cm) Weight: 176 lb 6 oz (80.003 kg) IBW/kg (Calculated) : 59.3 Heparin Dosing Weight:   Vital Signs: Temp: 98.2 F (36.8 C) (10/18 0506) Temp Source: Oral (10/18 0506) BP: 147/68 mmHg (10/18 0506) Pulse Rate: 71 (10/18 0506)  Labs:  Recent Labs  02/04/15 0606 02/06/15 1012 02/06/15 2023 02/07/15 0339  HGB  --  11.2*  --   --   HCT  --  33.7*  --   --   PLT  --  174  --   --   LABPROT 33.1* 24.3*  --  25.2*  INR 3.24 2.17  --  2.27  CREATININE  --  0.55 0.50 0.56  TROPONINI  --  0.05*  --   --     Estimated Creatinine Clearance: 44.9 mL/min (by C-G formula based on Cr of 0.56).   Medical History: Past Medical History  Diagnosis Date  . Hypertension   . Arthritis     osteoarthritis  . Hyperlipidemia   . Temporal giant cell arteritis (HCC)     left eye  . Atrial fibrillation ([place])   . GERD (gastroesophageal reflux disease)   . CHF (congestive heart failure) ([place])   . Glaucoma   . DJD (degenerative joint disease)   . TIA (transient ischemic attack)   . Bell's palsy   . Cancer ([place])     skin    Medications:  Scheduled:  . aspirin   162.5 mg Oral Daily  . benazepril  20 mg Oral Daily  . calcium carbonate  1 tablet Oral BID WC  . conjugated estrogens  1 Applicatorful Vaginal Daily  . hydrALAZINE  25 mg Oral 3 times per day  . latanoprost  1 drop Right Eye QHS  . metoprolol succinate  25 mg Oral Daily  . oxybutynin  5 mg Oral QHS  . pantoprazole  40 mg Oral QAC breakfast  . vitamin B-12  1,000 mcg Oral Daily  . [START ON 02/08/2015] warfarin  2 mg Oral Q Wed  . warfarin  3 mg Oral Once per day on Sun Mon Tue Thu Fri Sat  . Warfarin - Pharmacist Dosing Inpatient   Does not apply q1800    Assessment: Pt is a 81 year old female with a hx of afib on warfarin at home. Pt was recently discharged from [facility] on the 15th, INR was supratherapeutic. Most likely a delayed drug interaction with antibiotics. INR is now therapeutic after a 2 day hold.  Goal of Therapy:  INR 2-3 Monitor platelets by anticoagulation protocol: Yes   Plan:  Pt is no longer on antibiotics. Will restart pt home dose of 3mg  daily except 2mg  on wed. Will recheck  INR tomorrow morning. Pt currently not on antibiotics.  10/18 AM INR 2.27. Continue current regimen and recheck in AM.  Guy Toney S 02/07/2015,6:03 AM

## 2015-02-09 LAB — URINE CULTURE: Special Requests: NORMAL

## 2015-02-11 NOTE — Clinical Social Work Placement (Signed)
Late entry 02/10/15 for 02/07/15  CLINICAL SOCIAL WORK PLACEMENT  NOTE  Date:  02/11/2015  Patient Details  Name: Joyce Vasquez MRN: [id number] Date of Birth: [birth year]  Clinical Social Work is seeking post-discharge placement for this patient at the [place] level of care (*CSW will initial, date and re-position this form in  chart as items are completed):  Yes   Patient/family provided with [place] Work Department's list of facilities offering this level of care within the geographic area requested by the patient (or if unable, by the patient's family).  Yes   Patient/family informed of their freedom to choose among providers that offer the needed level of care, that participate in Medicare, Medicaid or managed care program needed by the patient, have an available bed and are willing to accept the patient.  Yes   Patient/family informed of [facility]'s ownership interest in [facility] and [facility], as well as of the fact that they are under no obligation to receive care at these facilities.  PASRR submitted to EDS on       PASRR number received on       Existing PASRR number confirmed on 02/07/15     FL2 transmitted to all facilities in geographic area requested by pt/family on 02/07/15     FL2 transmitted to all facilities within larger geographic area on       Patient informed that his/her managed care company has contracts with or will negotiate with certain facilities, including the following:        Yes   Patient/family informed of bed offers received.  Patient chooses bed at [facility]     Physician recommends and patient chooses bed at      Patient to be transferred to [facility] on 02/07/15.  Patient to be transferred to facility by Family will transport     Patient family notified on 02/07/15 of transfer.  Name of family member notified:  Daughter transporting patient      PHYSICIAN       Additional Comment:    _______________________________________________ Alonna Buckler, LCSW 02/11/2015, 12:45 AM

## 2015-02-17 ENCOUNTER — Inpatient Hospital Stay: Payer: Medicare Other

## 2015-02-17 ENCOUNTER — Encounter: Payer: Self-pay | Admitting: Emergency Medicine

## 2015-02-17 ENCOUNTER — Inpatient Hospital Stay
Admission: EM | Admit: 2015-02-17 | Discharge: 2015-02-21 | DRG: 640 | Disposition: A | Discharge status: Skilled Nursing Facility | Payer: Medicare Other | Attending: Internal Medicine | Admitting: Internal Medicine

## 2015-02-17 ENCOUNTER — Emergency Department: Payer: Medicare Other

## 2015-02-17 DIAGNOSIS — R29810 Facial weakness: Secondary | ICD-10-CM | POA: Diagnosis present

## 2015-02-17 DIAGNOSIS — Z452 Encounter for adjustment and management of vascular access device: Secondary | ICD-10-CM

## 2015-02-17 DIAGNOSIS — R739 Hyperglycemia, unspecified: Secondary | ICD-10-CM | POA: Diagnosis present

## 2015-02-17 DIAGNOSIS — Z85828 Personal history of other malignant neoplasm of skin: Secondary | ICD-10-CM

## 2015-02-17 DIAGNOSIS — I509 Heart failure, unspecified: Secondary | ICD-10-CM

## 2015-02-17 DIAGNOSIS — M1990 Unspecified osteoarthritis, unspecified site: Secondary | ICD-10-CM | POA: Diagnosis present

## 2015-02-17 DIAGNOSIS — E860 Dehydration: Secondary | ICD-10-CM | POA: Diagnosis present

## 2015-02-17 DIAGNOSIS — F0391 Unspecified dementia with behavioral disturbance: Secondary | ICD-10-CM | POA: Diagnosis present

## 2015-02-17 DIAGNOSIS — Z7982 Long term (current) use of aspirin: Secondary | ICD-10-CM

## 2015-02-17 DIAGNOSIS — Z880 Allergy status to penicillin: Secondary | ICD-10-CM | POA: Diagnosis not present

## 2015-02-17 DIAGNOSIS — Z9049 Acquired absence of other specified parts of digestive tract: Secondary | ICD-10-CM

## 2015-02-17 DIAGNOSIS — G9341 Metabolic encephalopathy: Secondary | ICD-10-CM | POA: Diagnosis present

## 2015-02-17 DIAGNOSIS — Z9841 Cataract extraction status, right eye: Secondary | ICD-10-CM

## 2015-02-17 DIAGNOSIS — E785 Hyperlipidemia, unspecified: Secondary | ICD-10-CM

## 2015-02-17 DIAGNOSIS — R1310 Dysphagia, unspecified: Secondary | ICD-10-CM | POA: Diagnosis present

## 2015-02-17 DIAGNOSIS — G9340 Encephalopathy, unspecified: Secondary | ICD-10-CM

## 2015-02-17 DIAGNOSIS — F03918 Unspecified dementia, unspecified severity, with other behavioral disturbance: Secondary | ICD-10-CM | POA: Insufficient documentation

## 2015-02-17 DIAGNOSIS — Z66 Do not resuscitate: Secondary | ICD-10-CM | POA: Diagnosis present

## 2015-02-17 DIAGNOSIS — E871 Hypo-osmolality and hyponatremia: Principal | ICD-10-CM

## 2015-02-17 DIAGNOSIS — I482 Chronic atrial fibrillation, unspecified: Secondary | ICD-10-CM

## 2015-02-17 DIAGNOSIS — I10 Essential (primary) hypertension: Secondary | ICD-10-CM

## 2015-02-17 DIAGNOSIS — Z79899 Other long term (current) drug therapy: Secondary | ICD-10-CM | POA: Diagnosis not present

## 2015-02-17 DIAGNOSIS — F0390 Unspecified dementia without behavioral disturbance: Secondary | ICD-10-CM | POA: Diagnosis present

## 2015-02-17 DIAGNOSIS — Z96653 Presence of artificial knee joint, bilateral: Secondary | ICD-10-CM | POA: Diagnosis present

## 2015-02-17 DIAGNOSIS — Z888 Allergy status to other drugs, medicaments and biological substances status: Secondary | ICD-10-CM | POA: Diagnosis not present

## 2015-02-17 DIAGNOSIS — D649 Anemia, unspecified: Secondary | ICD-10-CM | POA: Diagnosis present

## 2015-02-17 DIAGNOSIS — N390 Urinary tract infection, site not specified: Secondary | ICD-10-CM | POA: Diagnosis present

## 2015-02-17 DIAGNOSIS — L8990 Pressure ulcer of unspecified site, unspecified stage: Secondary | ICD-10-CM

## 2015-02-17 DIAGNOSIS — Z7901 Long term (current) use of anticoagulants: Secondary | ICD-10-CM

## 2015-02-17 DIAGNOSIS — H409 Unspecified glaucoma: Secondary | ICD-10-CM | POA: Diagnosis present

## 2015-02-17 DIAGNOSIS — K219 Gastro-esophageal reflux disease without esophagitis: Secondary | ICD-10-CM | POA: Diagnosis present

## 2015-02-17 DIAGNOSIS — Z9889 Other specified postprocedural states: Secondary | ICD-10-CM | POA: Diagnosis not present

## 2015-02-17 DIAGNOSIS — I110 Hypertensive heart disease with heart failure: Secondary | ICD-10-CM | POA: Diagnosis present

## 2015-02-17 DIAGNOSIS — G510 Bell's palsy: Secondary | ICD-10-CM

## 2015-02-17 DIAGNOSIS — Z8673 Personal history of transient ischemic attack (TIA), and cerebral infarction without residual deficits: Secondary | ICD-10-CM | POA: Diagnosis not present

## 2015-02-17 DIAGNOSIS — R531 Weakness: Secondary | ICD-10-CM

## 2015-02-17 DIAGNOSIS — R4182 Altered mental status, unspecified: Secondary | ICD-10-CM

## 2015-02-17 DIAGNOSIS — Z950 Presence of cardiac pacemaker: Secondary | ICD-10-CM | POA: Diagnosis not present

## 2015-02-17 DIAGNOSIS — K148 Other diseases of tongue: Secondary | ICD-10-CM

## 2015-02-17 DIAGNOSIS — R8281 Pyuria: Secondary | ICD-10-CM | POA: Insufficient documentation

## 2015-02-17 LAB — BASIC METABOLIC PANEL
Anion gap: 9 (ref 5–15)
BUN: 22 mg/dL — AB (ref 6–20)
CHLORIDE: 88 mmol/L — AB (ref 101–111)
CO2: 25 mmol/L (ref 22–32)
CREATININE: 0.74 mg/dL (ref 0.44–1.00)
Calcium: 8.8 mg/dL — ABNORMAL LOW (ref 8.9–10.3)
GFR calc Af Amer: >60 mL/min (ref >60)
GLUCOSE: 121 mg/dL — AB (ref 65–99)
POTASSIUM: 4 mmol/L (ref 3.5–5.1)
SODIUM: 122 mmol/L — AB (ref 135–145)

## 2015-02-17 LAB — TROPONIN I: Troponin I: <0.03 ng/mL (ref <0.031)

## 2015-02-17 LAB — URINALYSIS COMPLETE WITH MICROSCOPIC (ARMC ONLY)
Bilirubin Urine: NEGATIVE
Glucose, UA: NEGATIVE mg/dL
HGB URINE DIPSTICK: NEGATIVE
Ketones, ur: NEGATIVE mg/dL
NITRITE: NEGATIVE
PH: 7 (ref 5.0–8.0)
Protein, ur: NEGATIVE mg/dL
Specific Gravity, Urine: 1.008 (ref 1.005–1.030)

## 2015-02-17 LAB — CBC WITH DIFFERENTIAL/PLATELET
Basophils Absolute: 0 10*3/uL (ref 0–0.1)
Basophils Relative: 1 %
EOS ABS: 0.1 10*3/uL (ref 0–0.7)
EOS PCT: 2 %
HCT: 28 % — ABNORMAL LOW (ref 35.0–47.0)
Hemoglobin: 9.1 g/dL — ABNORMAL LOW (ref 12.0–16.0)
LYMPHS ABS: 0.9 10*3/uL — AB (ref 1.0–3.6)
LYMPHS PCT: 14 %
MCH: 28.2 pg (ref 26.0–34.0)
MCHC: 32.6 g/dL (ref 32.0–36.0)
MCV: 86.2 fL (ref 80.0–100.0)
MONO ABS: 0.7 10*3/uL (ref 0.2–0.9)
Monocytes Relative: 10 %
Neutro Abs: 4.8 10*3/uL (ref 1.4–6.5)
Neutrophils Relative %: 73 %
Platelets: 169 10*3/uL (ref 150–440)
RBC: 3.25 MIL/uL — ABNORMAL LOW (ref 3.80–5.20)
RDW: 15.1 % — ABNORMAL HIGH (ref 11.5–14.5)
WBC: 6.5 10*3/uL (ref 3.6–11.0)

## 2015-02-17 LAB — PROTIME-INR
INR: 2.51
PROTHROMBIN TIME: 27.2 s — AB (ref 11.4–15.0)

## 2015-02-17 LAB — HEMOGLOBIN A1C: Hgb A1c MFr Bld: 5.7 % (ref 4.0–6.0)

## 2015-02-17 LAB — OSMOLALITY, URINE: OSMOLALITY UR: 238 mosm/kg — AB (ref 300–900)

## 2015-02-17 LAB — APTT: APTT: 35 s (ref 24–36)

## 2015-02-17 MED ORDER — WARFARIN SODIUM 3 MG PO TABS
3.0000 mg | ORAL_TABLET | ORAL | Status: DC
  Administered 2015-02-18 – 2015-02-19 (×2): 3 mg via ORAL
  Filled 2015-02-17 (×5): qty 1

## 2015-02-17 MED ORDER — LATANOPROST 0.005 % OP SOLN
1.0000 [drp] | Freq: Every day | OPHTHALMIC | Status: DC
  Administered 2015-02-17 – 2015-02-20 (×4): 1 [drp] via OPHTHALMIC
  Filled 2015-02-17: qty 2.5

## 2015-02-17 MED ORDER — NITROGLYCERIN 0.4 MG SL SUBL: 0.4000 mg | SUBLINGUAL_TABLET | SUBLINGUAL | Status: DC | PRN

## 2015-02-17 MED ORDER — HYDRALAZINE HCL 20 MG/ML IJ SOLN
10.0000 mg | INTRAMUSCULAR | Status: DC | PRN
  Administered 2015-02-17: 22:00:00 via INTRAVENOUS
  Filled 2015-02-17: qty 1

## 2015-02-17 MED ORDER — VITAMIN B-12 1000 MCG PO TABS
1000.0000 ug | ORAL_TABLET | Freq: Every day | ORAL | Status: DC
  Administered 2015-02-18 – 2015-02-21 (×4): 1000 ug via ORAL
  Filled 2015-02-17 (×4): qty 1

## 2015-02-17 MED ORDER — OXYBUTYNIN CHLORIDE ER 5 MG PO TB24
5.0000 mg | ORAL_TABLET | Freq: Every day | ORAL | Status: DC
  Administered 2015-02-18 – 2015-02-20 (×3): 5 mg via ORAL
  Filled 2015-02-17 (×5): qty 1

## 2015-02-17 MED ORDER — HALOPERIDOL LACTATE 5 MG/ML IJ SOLN
1.0000 mg | Freq: Four times a day (QID) | INTRAMUSCULAR | Status: DC | PRN
Start: 2015-02-17 — End: 2015-02-21
  Administered 2015-02-17 – 2015-02-19 (×3): 1 mg via INTRAVENOUS
  Filled 2015-02-17 (×3): qty 1

## 2015-02-17 MED ORDER — SODIUM CHLORIDE 0.9 % IV SOLN
500.0000 mg | Freq: Three times a day (TID) | INTRAVENOUS | Status: DC
  Administered 2015-02-17 – 2015-02-21 (×11): 500 mg via INTRAVENOUS
  Filled 2015-02-17 (×14): qty 500

## 2015-02-17 MED ORDER — PANTOPRAZOLE SODIUM 40 MG PO TBEC
40.0000 mg | DELAYED_RELEASE_TABLET | Freq: Every day | ORAL | Status: DC
  Administered 2015-02-18 – 2015-02-21 (×4): 40 mg via ORAL
  Filled 2015-02-17 (×4): qty 1

## 2015-02-17 MED ORDER — CETYLPYRIDINIUM CHLORIDE 0.05 % MT LIQD
7.0000 mL | Freq: Two times a day (BID) | OROMUCOSAL | Status: DC
  Administered 2015-02-17 – 2015-02-21 (×9): 7 mL via OROMUCOSAL

## 2015-02-17 MED ORDER — BENAZEPRIL HCL 20 MG PO TABS
20.0000 mg | ORAL_TABLET | Freq: Every day | ORAL | Status: DC
  Administered 2015-02-18 – 2015-02-21 (×4): 20 mg via ORAL
  Filled 2015-02-17 (×4): qty 1

## 2015-02-17 MED ORDER — METOPROLOL SUCCINATE ER 25 MG PO TB24
25.0000 mg | ORAL_TABLET | Freq: Every day | ORAL | Status: DC
Start: 2015-02-17 — End: 2015-02-21
  Administered 2015-02-18 – 2015-02-21 (×4): 25 mg via ORAL
  Filled 2015-02-17 (×4): qty 1

## 2015-02-17 MED ORDER — MAGNESIUM OXIDE 400 (241.3 MG) MG PO TABS
400.0000 mg | ORAL_TABLET | Freq: Every day | ORAL | Status: DC
  Administered 2015-02-18 – 2015-02-21 (×4): 400 mg via ORAL
  Filled 2015-02-17 (×4): qty 1

## 2015-02-17 MED ORDER — WARFARIN - PHYSICIAN DOSING INPATIENT
Freq: Every day | Status: DC
  Administered 2015-02-18 – 2015-02-19 (×2)

## 2015-02-17 MED ORDER — HYDRALAZINE HCL 25 MG PO TABS
25.0000 mg | ORAL_TABLET | Freq: Three times a day (TID) | ORAL | Status: DC
  Administered 2015-02-18 – 2015-02-20 (×6): 25 mg via ORAL
  Filled 2015-02-17 (×10): qty 1

## 2015-02-17 MED ORDER — WARFARIN SODIUM 2 MG PO TABS: 2.0000 mg | ORAL_TABLET | ORAL | Status: DC

## 2015-02-17 MED ORDER — SODIUM CHLORIDE 0.9 % IV BOLUS (SEPSIS)
1000.0000 mL | Freq: Once | INTRAVENOUS | Status: AC
  Administered 2015-02-17: 1000 mL via INTRAVENOUS

## 2015-02-17 MED ORDER — ASPIRIN 325 MG PO TABS
162.5000 mg | ORAL_TABLET | Freq: Every day | ORAL | Status: DC
  Administered 2015-02-18 – 2015-02-21 (×4): 162.5 mg via ORAL
  Filled 2015-02-17 (×4): qty 1

## 2015-02-17 MED ORDER — SODIUM CHLORIDE 0.9 % IV SOLN
INTRAVENOUS | Status: DC
  Administered 2015-02-17 – 2015-02-18 (×2): via INTRAVENOUS

## 2015-02-17 MED ORDER — SODIUM CHLORIDE 0.9 % IJ SOLN
3.0000 mL | Freq: Two times a day (BID) | INTRAMUSCULAR | Status: DC
  Administered 2015-02-18 – 2015-02-21 (×5): 3 mL via INTRAVENOUS

## 2015-02-17 NOTE — Progress Notes (Signed)
Speech Therapy Note: received order, reviewed chart notes; consulted NSG re: pt's status. Pt is a 82 y.o. female with a known h/o of Bell's palsy and recent admissions(2) for hyponatremia, fatigue, urinary tract infection, Escherichia coli ESBL. She was sent to skilled nursing facility [facility]. She was doing relatively well. However, yesterday she was noted to be more confused than her baseline and repeated words continuously. She was noted to have worsening left-sided facial weakness(Bell's Palsy?) and because of concern of stroke, she was brought to emergency room for further evaluation. In emergency room, she was noted to have pyuria as well as significant hyponatremia with sodium level of 122. Per patient's family,she has not been eating well over the past 3 weeks.  Upon entering room, pt exhibited psychomotor agitation and decreased mental alertness w/ increased verbalizations(continuous counting, perseverations). Pt was determined to be inappropriate for participation in BSE at this time w/ increased risk for aspiration sec. to her declined Cognitive status. Rec. Continue NPO status w/ oral care using swabs. ST services will f/u w/ pt's status tomorrow for readiness for BSE/po's. Information on oral care given, swabs left. NSG and family updated and agreed.

## 2015-02-17 NOTE — H&P (Signed)
[facility] NAME: Joyce Vasquez    MR#:  [id number]  DATE OF BIRTH:  1916-12-29  DATE OF ADMISSION:  02/17/2015  PRIMARY CARE PHYSICIAN: Chrisandra Carota, MD   REQUESTING/REFERRING PHYSICIAN:   CHIEF COMPLAINT:   Chief Complaint  Patient presents with  . Altered Mental Status    HISTORY OF PRESENT ILLNESS: Joyce Vasquez  is a 81 y.o. female with a known history of atrial fibrillation on Coumadin therapy, hypertension, chronic congestive heart failure, Bell's palsy, hyperlipidemia who was admitted on the eighth and then on 02/06/2015 for hyponatremia, fatigue, urinary tract infection, Escherichia coli ESBL, after therapy in the hospital. She was sent to skilled nursing facility [facility]. She was doing relatively well. However, yesterday she was noted to be more confused and repeating words continuously. She was noted to have worsening left-sided facial weakness and because of concern of stroke. She was brought to emergency room for further evaluation. In emergency room, she was noted to have pyuria as well as significant hyponatremia with sodium level of 122. Per patient's family,  she is now not eating well over the past 3 weeks. Patient herself is not able to provide review of systems due to confusion.      Medical HISTORY:   Past Medical History  Diagnosis Date  . Hypertension   . Arthritis     osteoarthritis  . Hyperlipidemia   . Temporal giant cell arteritis (HCC)     left eye  . Atrial fibrillation ([place])   . GERD (gastroesophageal reflux disease)   . CHF (congestive heart failure) ([place])   . Glaucoma   . DJD (degenerative joint disease)   . TIA (transient ischemic attack)   . Bell's palsy   . Cancer ([place])     skin    PAST SURGICAL HISTORY:  Past Surgical History  Procedure Laterality Date  . Carotid endarterectomy Right   . Appendectomy    . Abdominal hysterectomy    . Cholecystectomy    . Cardiac  catheterization    . Joint replacement Bilateral     Right and Left knee Replacement  . Breast surgery Left     Excisional Breast Biopsy  . Tonsillectomy    . Eye surgery Right     Cataract Extraction--right eye only  . Pacemaker insertion Left 11/29/2014    Procedure: INSERTION PACEMAKER;  Surgeon: Isaias Cowman, MD;  Location: [facility] ORS;  Service: Cardiovascular;  Laterality: Left;    SOCIAL HISTORY:  Social History  Substance Use Topics  . Smoking status: Never Smoker   . Smokeless tobacco: Never Used  . Alcohol Use: No    FAMILY HISTORY: No family history on file.  DRUG ALLERGIES:  Allergies  Allergen Reactions  . Coreg [Carvedilol]   . Erythromycin Base Nausea And Vomiting  . Penicillins Swelling    "swelling of throat" Has patient had a PCN reaction causing immediate rash, facial/tongue/throat swelling, SOB or lightheadedness with hypotension: Yes Has patient had a PCN reaction causing severe rash involving mucus membranes or skin necrosis: No Has patient had a PCN reaction that required hospitalization No Has patient had a PCN reaction occurring within the last 10 years: No If all of the above answers are "NO", then may proceed with Cephalosporin use.  . Vitamin D Analogs   . Cefuroxime Axetil Nausea Only and Rash  . Morphine And Related Rash    Review of Systems  Unable to perform ROS: medical condition  MEDICATIONS AT HOME:  Prior to Admission medications   Medication Sig Start Date End Date Taking? Authorizing Provider  aspirin 325 MG tablet Take 162.5 mg by mouth daily.   Yes Historical Provider, MD  benazepril (LOTENSIN) 20 MG tablet Take 1 tablet (20 mg total) by mouth daily. 02/04/15  Yes Demetrios Loll, MD  bimatoprost (LUMIGAN) 0.03 % ophthalmic solution Place 1 drop into the right eye at bedtime.   Yes Historical Provider, MD  calcium carbonate (TUMS - DOSED IN MG ELEMENTAL CALCIUM) 500 MG chewable tablet Chew 1 tablet (200 mg of elemental calcium  total) by mouth 2 (two) times daily with a meal. 02/07/15  Yes Sital Mody, MD  conjugated estrogens (PREMARIN) vaginal cream Place 1 Applicatorful vaginally daily. 02/04/15  Yes Demetrios Loll, MD  hydrALAZINE (APRESOLINE) 25 MG tablet Take 1 tablet (25 mg total) by mouth every 8 (eight) hours. 12/05/14  Yes Epifanio Lesches, MD  Magnesium Oxide 400 (240 MG) MG TABS Take 400 mg by mouth daily. 02/07/15  Yes Bettey Costa, MD  metoprolol succinate (TOPROL-XL) 25 MG 24 hr tablet Take 25 mg by mouth daily.    Yes Historical Provider, MD  nitrofurantoin (MACRODANTIN) 100 MG capsule Take 100 mg by mouth daily.   Yes Historical Provider, MD  nitroGLYCERIN (NITROSTAT) 0.4 MG SL tablet Place 0.4 mg under the tongue every 5 (five) minutes as needed for chest pain. After 3rd dose if no relief call 911   Yes Historical Provider, MD  omeprazole (PRILOSEC) 20 MG capsule Take 20 mg by mouth daily.   Yes Historical Provider, MD  oxybutynin (DITROPAN-XL) 5 MG 24 hr tablet Take 5 mg by mouth at bedtime.   Yes Historical Provider, MD  potassium chloride (KLOR-CON 10) 10 MEQ tablet Take 10 mEq by mouth daily as needed (when taking furosemide).    Yes Historical Provider, MD  vitamin B-12 (CYANOCOBALAMIN) 1000 MCG tablet Take 1,000 mcg by mouth daily.   Yes Historical Provider, MD  warfarin (COUMADIN) 2 MG tablet Take 2 mg by mouth every Wednesday.    Yes Historical Provider, MD  warfarin (COUMADIN) 3 MG tablet Take 3 mg by mouth See admin instructions. Take 1 tablet orally every day except on Wednesday   Yes Historical Provider, MD      PHYSICAL EXAMINATION:   VITAL SIGNS: Blood pressure 135/82, pulse 69, temperature 97.7 F (36.5 C), temperature source Oral, height 5\' 6"  (1.676 m), weight 72.576 kg (160 lb), SpO2 99 %.  GENERAL:  81 y.o.-year-old patient lying in the bed with no acute distress. Intermittently follow commands, but usually is withdrawn. She lifts her upper extremities as well as lower extremities and  continuously repeats words, difficult to distract,  to communicate. No eye contact. Minimal verbal interaction EYES: Pupils equal, round, reactive to light and accommodation. No scleral icterus. Extraocular muscles intact. Left-sided facial weakness.  HEENT: Head atraumatic, normocephalic. Oropharynx and nasopharynx clear.  NECK:  Supple, no jugular venous distention. No thyroid enlargement, no tenderness.  LUNGS: Normal breath sounds bilaterally, no wheezing, rales,rhonchi or crepitation. No use of accessory muscles of respiration.  CARDIOVASCULAR: S1, S2 irregular, irregular. No murmurs, rubs, or gallops.  ABDOMEN: Soft, nontender, nondistended. Bowel sounds present. No organomegaly or mass.  EXTREMITIES: No pedal edema, cyanosis, or clubbing.  NEUROLOGIC: Cranial nerves II through XII revealed left-sided facial weakness. Tongue deviation to the right. Muscle strength 4/5 in all extremities. Sensation not able to assess due to patient's confusion. Gait not checked.  PSYCHIATRIC: The patient  is alert , not able to assess orientation. Patient is withdrawn, poor eye contact. Minimal verbal interaction. Repeating was continuously, posturing.   SKIN: No obvious rash, lesion, or ulcer. Right lower extremity anterior tibial area skin fissure, no drainage, erythema or swelling noted  LABORATORY PANEL:   CBC  Recent Labs Lab 02/17/15 0847  WBC 6.5  HGB 9.1*  HCT 28.0*  PLT 169  MCV 86.2  MCH 28.2  MCHC 32.6  RDW 15.1*  LYMPHSABS 0.9*  MONOABS 0.7  EOSABS 0.1  BASOSABS 0.0   ------------------------------------------------------------------------------------------------------------------  Chemistries   Recent Labs Lab 02/17/15 0847  NA 122*  K 4.0  CL 88*  CO2 25  GLUCOSE 121*  BUN 22*  CREATININE 0.74  CALCIUM 8.8*   ------------------------------------------------------------------------------------------------------------------  Cardiac Enzymes  Recent Labs Lab  02/17/15 0847  TROPONINI <0.03   ------------------------------------------------------------------------------------------------------------------  RADIOLOGY: Ct Head Wo Contrast  02/17/2015  CLINICAL DATA:  Altered mental status.  Left facial droop. EXAM: CT HEAD WITHOUT CONTRAST TECHNIQUE: Contiguous axial images were obtained from the base of the skull through the vertex without intravenous contrast. COMPARISON:  12/04/2014 FINDINGS: There is no evidence of mass effect, midline shift, or extra-axial fluid collections. There is no evidence of a space-occupying lesion or intracranial hemorrhage. There is no evidence of a cortical-based area of acute infarction. There is generalized cerebral atrophy. There is periventricular white matter low attenuation likely secondary to microangiopathy. The ventricles and sulci are appropriate for the patient's age. The basal cisterns are patent. Visualized portions of the orbits are unremarkable. The visualized portions of the paranasal sinuses and mastoid air cells are unremarkable. Cerebrovascular atherosclerotic calcifications are noted. The osseous structures are unremarkable. IMPRESSION: 1. No acute intracranial pathology. 2. Chronic microvascular disease and cerebral atrophy. Electronically Signed   By: Kathreen Devoid   On: 02/17/2015 09:25    EKG: Orders placed or performed during the hospital encounter of 02/17/15  . ED EKG  . ED EKG    IMPRESSION AND PLAN:  Principal Problem:   CVA (cerebral infarction) Active Problems:   Encephalopathy acute   UTI (lower urinary tract infection) 1. Acute Encephalopathy, likely metabolic due to hyponatremia as well as urinary tract infection, continue neuro checks, unlikely stroke as patient has only facial weakness, which is a likely old Bell's palsy. Follow clinically 2. Hyponatremia. Get urine osmolarity, initiate fluid restrictions. If urine osmolarity is high. Continue IV fluids for now for likely  dehydration 3. Urinary tract infection, concerning for recurrence of Escherichia coli, ESBL , discontinue nitrofurantoin. Initiate patient on imipenem  after placing PICC line 4. Anemia, get guaiac to rule out a GI bleed as patient is on Coumadin therapy 5. Hyperglycemia. Get hemoglobin A1c to rule out diabetes   All the records are reviewed and case discussed with ED provider. Management plans discussed with the patient, family and they are in agreement.  CODE STATUS: Advance Directive Documentation        Most Recent Value   Type of Advance Directive  Living will [Pt has DNR]   Pre-existing out of facility DNR order (yellow form or pink MOST form)     "MOST" Form in Place?         TOTAL TIME TAKING CARE OF THIS PATIENT: 55 minutes.    Theodoro Grist M.D on 02/17/2015 at 10:42 AM  Between 7am to 6pm - Pager - [phone] After 6pm go to [email or web address] - password [email or web address] [facility]  [place]  Office  [phone]  CC: Primary care  physician; Chrisandra Carota, MD

## 2015-02-17 NOTE — Progress Notes (Signed)
   02/17/15 0915  Clinical Encounter Type  Visited With Patient and family together  Visit Type Initial;Spiritual support;Social support  Referral From Nurse  Consult/Referral To Chaplain  Spiritual Encounters  Spiritual Needs Emotional  Code Stroke called. Met with patient's 2 children. Spent time offering emotional and social support. Will check back at a later time. [place]

## 2015-02-17 NOTE — ED Notes (Signed)
Pt lives at [facility] and was discovered to have altered mental status this morning at 0815.  Pt arrived to ED with L facial droop, repetitive counting and repeating phrases continuously.  Pt inconsistantly follows commands.

## 2015-02-17 NOTE — ED Provider Notes (Signed)
[facility] Emergency Department Provider Note   ____________________________________________  Time seen: Upon EMS arrival I have reviewed the triage vital signs and the triage nursing note.  HISTORY  Chief Complaint Altered Mental Status   Historian [employer], patient with altered mental status as well as dementia History per nursing staff at [facility] History per EMS  HPI Joyce Vasquez is a 81 y.o. female who has some confusion at baseline, and stays at a nursing home, and when the nurse checked on her this morning he noted that she was altered to her baseline. She was reciting different numbers. She was talking to [employer]. "This was the first time anyone had seen her this morning. Last seen at her normal mental status baseline last night.  Daughter arrived and gave some additional history, and states that she was watching TV with her last night and she was having a little bit of altered mental status in terms of repeating numbers. Daughter does not recall noticing tongue deviation or left side facial droop.    Nothing seems to make this worse or better. No reported history of fevers. No reported history of chest pain or trouble breathing.    Past Medical History  Diagnosis Date  . Hypertension   . Arthritis     osteoarthritis  . Hyperlipidemia   . Temporal giant cell arteritis (HCC)     left eye  . Atrial fibrillation ([place])   . GERD (gastroesophageal reflux disease)   . CHF (congestive heart failure) ([place])   . Glaucoma   . DJD (degenerative joint disease)   . TIA (transient ischemic attack)   . Bell's palsy   . Cancer [facility])     skin    Patient Active Problem List   Diagnosis Date Noted  . Hypomagnesemia 02/06/2015  . Hyponatremia 01/28/2015  . HTN (hypertension), malignant 12/04/2014  . Status post cardiac pacemaker procedure 11/29/2014    Past Surgical History  Procedure Laterality Date  . Carotid endarterectomy Right   .  Appendectomy    . Abdominal hysterectomy    . Cholecystectomy    . Cardiac catheterization    . Joint replacement Bilateral     Right and Left knee Replacement  . Breast surgery Left     Excisional Breast Biopsy  . Tonsillectomy    . Eye surgery Right     Cataract Extraction--right eye only  . Pacemaker insertion Left 11/29/2014    Procedure: INSERTION PACEMAKER;  Surgeon: Isaias Cowman, MD;  Location: [facility] ORS;  Service: Cardiovascular;  Laterality: Left;    Current Outpatient Rx  Name  Route  Sig  Dispense  Refill  . aspirin 325 MG tablet   Oral   Take 162.5 mg by mouth daily.         . benazepril (LOTENSIN) 20 MG tablet   Oral   Take 1 tablet (20 mg total) by mouth daily.   30 tablet   0   . bimatoprost (LUMIGAN) 0.03 % ophthalmic solution   Right Eye   Place 1 drop into the right eye at bedtime.         . calcium carbonate (TUMS - DOSED IN MG ELEMENTAL CALCIUM) 500 MG chewable tablet   Oral   Chew 1 tablet (200 mg of elemental calcium total) by mouth 2 (two) times daily with a meal.   120 tablet   0   . conjugated estrogens (PREMARIN) vaginal cream   Vaginal   Place 1 Applicatorful vaginally daily.  42.5 g   2   . hydrALAZINE (APRESOLINE) 25 MG tablet   Oral   Take 1 tablet (25 mg total) by mouth every 8 (eight) hours.   60 tablet   0   . Magnesium Oxide 400 (240 MG) MG TABS   Oral   Take 400 mg by mouth daily.   30 tablet   0   . metoprolol succinate (TOPROL-XL) 25 MG 24 hr tablet   Oral   Take 25 mg by mouth daily.          . nitrofurantoin (MACRODANTIN) 100 MG capsule   Oral   Take 100 mg by mouth daily.         . nitroGLYCERIN (NITROSTAT) 0.4 MG SL tablet   Sublingual   Place 0.4 mg under the tongue every 5 (five) minutes as needed for chest pain. After 3rd dose if no relief call 911         . omeprazole (PRILOSEC) 20 MG capsule   Oral   Take 20 mg by mouth daily.         Marland Kitchen oxybutynin (DITROPAN-XL) 5 MG 24 hr tablet    Oral   Take 5 mg by mouth at bedtime.         . potassium chloride (KLOR-CON 10) 10 MEQ tablet   Oral   Take 10 mEq by mouth daily as needed (when taking furosemide).          . vitamin B-12 (CYANOCOBALAMIN) 1000 MCG tablet   Oral   Take 1,000 mcg by mouth daily.         Marland Kitchen warfarin (COUMADIN) 2 MG tablet   Oral   Take 2 mg by mouth every Wednesday.          . warfarin (COUMADIN) 3 MG tablet   Oral   Take 3 mg by mouth See admin instructions. Take 1 tablet orally every day except on Wednesday           Allergies Coreg; Erythromycin base; Penicillins; Vitamin d analogs; Cefuroxime axetil; and Morphine and related  No family history on file.  Social History Social History  Substance Use Topics  . Smoking status: Never Smoker   . Smokeless tobacco: Never Used  . Alcohol Use: No    Review of Systems  Constitutional: Negative for fever. Eyes: Negative for visual changes. ENT: Negative for sore throat. Cardiovascular: Negative for chest pain. Respiratory: Negative for shortness of breath. Gastrointestinal: Negative for abdominal pain, vomiting and diarrhea. Genitourinary: Negative for dysuria. Musculoskeletal: Negative for back pain. Skin: Negative for rash. Neurological: Negative for headache. 10 point Review of Systems otherwise negative ____________________________________________   PHYSICAL EXAM:  VITAL SIGNS: ED Triage Vitals  Enc Vitals Group     BP 02/17/15 0838 135/82 mmHg     Pulse Rate 02/17/15 0838 69     Resp --      Temp 02/17/15 0838 97.7 F (36.5 C)     Temp Source 02/17/15 0838 Oral     SpO2 02/17/15 0838 99 %     Weight 02/17/15 0838 160 lb (72.576 kg)     Height 02/17/15 0838 5\' 6"  (1.676 m)     Head Cir --      Peak Flow --      Pain Score 02/17/15 0841 3     Pain Loc --      Pain Edu? --      Excl. in [place]? --      Constitutional: Alert  and confused. Well appearing and in no distress. Eyes: Conjunctivae are normal. PERRL.  Normal extraocular movements. ENT   Head: Normocephalic and atraumatic.   Nose: No congestion/rhinnorhea.   Mouth/Throat: Mucous membranes are moist.   Neck: No stridor. Cardiovascular/Chest: Bradycardic and irregularly irregular.  No murmurs, rubs, or gallops. Respiratory: Normal respiratory effort without tachypnea nor retractions. Breath sounds are clear and equal bilaterally. No wheezes/rales/rhonchi. Gastrointestinal: Soft. No distention, no guarding, no rebound. Nontender  Genitourinary/rectal:Deferred Musculoskeletal: Nontender with normal range of motion in all extremities. No joint effusions.  No lower extremity tenderness.  No edema. Neurologic:  No slurred speech. Constantly reciting different numbers counting up to 10, then counting in the 1000th.Marland Kitchen 4-5 strength in 4 extremities. No sensory deficit able to be elicited. Tongue deviation to the patient's right. Left-sided facial droop apparently sparing the forehead, mild. Skin:  Skin is warm, dry and intact. No rash noted.   ____________________________________________   EKG I, Lisa Roca, MD, the attending physician have personally viewed and interpreted all ECGs.  66 bpm. Atrial fibrillation. Left bundle branch block, intermittent. Nonspecific T wave. ____________________________________________  LABS (pertinent positives/negatives)  INR 9.45 Basic metabolic panel significant for sodium 1.2, chloride 88 and otherwise without significant abnormality White blood cell count 6.5, hemoglobin 9.1, platelet count 169 Troponin less than 0.03 Urinalysis 2+ leukocytes, 6-30 white blood cells and rare bacteria  ____________________________________________  RADIOLOGY All Xrays were viewed by me. Imaging interpreted by Radiologist.  CT head:  IMPRESSION: 1. No acute intracranial pathology. 2. Chronic microvascular disease and cerebral  atrophy. __________________________________________  PROCEDURES  Procedure(s) performed: None  Critical Care performed: CRITICAL CARE Performed by: Lisa Roca   Total critical care time: 30 minutes  Critical care time was exclusive of separately billable procedures and treating other patients.  Critical care was necessary to treat or prevent imminent or life-threatening deterioration.  Critical care was time spent personally by me on the following activities: development of treatment plan with patient and/or surrogate as well as nursing, discussions with consultants, evaluation of patient's response to treatment, examination of patient, obtaining history from patient or surrogate, ordering and performing treatments and interventions, ordering and review of laboratory studies, ordering and review of radiographic studies, pulse oximetry and re-evaluation of patient's condition.   ____________________________________________   ED COURSE / ASSESSMENT AND PLAN  CONSULTATIONS: Hospitalist for admission  Pertinent labs & imaging results that were available during my care of the patient were reviewed by me and considered in my medical decision making (see chart for details).   Patient was arrived with a concern for acute stroke. On initial exam patient did have a mild left-sided facial droop with right sided tongue deviation as well as an altered mental status consisting of reciting numbers. I did initiate code stroke and sent her to CT scan immediately.  Additional history was obtained from the nurse at the nursing home, most important that the patient was last noted to be normal last evening, and so time of onset is over 12 hours at this point. The daughter did come to the bedside and noted that some of the altered mental status was present last night when she was with her mother, but she did not notice left facial droop.  I reviewed the patient's DO NOT RESUSCITATE order.  CT head  was noted to show no acute abnormalities.  Plan will be to have the patient admitted for further monitoring and workup for a likely stroke clinically.  The patient is not a TPA  candidate due to unknown time of onset, greater than 12 hours.  Metabolic panel shows sodium of 122, normal saline bolus was initiated. She has been hyponatremic in the past, however not this low.  Urinalysis shows likely urinary tract infection, and she'll be started on a dose of Rocephin. She does not appear to be septic, with no abnormal vital signs, nor elevated white blood cell count. Blood cultures will be sent.   Patient / Family / Caregiver informed of clinical course, medical decision-making process, and agree with plan.    ___________________________________________   FINAL CLINICAL IMPRESSION(S) / ED DIAGNOSES   Final diagnoses:  Facial weakness  Tongue deviation  Altered mental status, unspecified altered mental status type  Urinary tract infection without hematuria, site unspecified  Hyponatremia       Lisa Roca, MD 02/17/15 1014

## 2015-02-17 NOTE — ED Notes (Signed)
Stroke response nurse comment - The pt was sent to the ER upon discovery of AMS at 0815 this morning by nursing home staff.  Per Dr. Reita Cliche the pt's family states that the pt was not acting like herself last night either so LKW undetermined but outside of the 8 hr treatment window, pt also on coumadin for A-fib and prior TIA, CT negative for bleed or infarct at this time. Since the pt is outside the code stroke window, the code stroke has been called off at this time. Mardene Celeste RN updated on status and will continue to monitor for neuro changes.

## 2015-02-17 NOTE — Progress Notes (Signed)
ANTIBIOTIC CONSULT NOTE - INITIAL  Pharmacy Consult for Imipenem Indication: UTI  Allergies  Allergen Reactions  . Coreg [Carvedilol]   . Erythromycin Base Nausea And Vomiting  . Penicillins Swelling    "swelling of throat" Has patient had a PCN reaction causing immediate rash, facial/tongue/throat swelling, SOB or lightheadedness with hypotension: Yes Has patient had a PCN reaction causing severe rash involving mucus membranes or skin necrosis: No Has patient had a PCN reaction that required hospitalization No Has patient had a PCN reaction occurring within the last 10 years: No If all of the above answers are "NO", then may proceed with Cephalosporin use.  . Vitamin D Analogs   . Cefuroxime Axetil Nausea Only and Rash  . Morphine And Related Rash    Patient Measurements: Height: 5\' 6"  (167.6 cm) Weight: 160 lb (72.576 kg) IBW/kg (Calculated) : 59.3  Vital Signs: Temp: 98.5 F (36.9 C) (10/28 1241) Temp Source: Oral (10/28 1241) BP: 124/83 mmHg (10/28 1241) Pulse Rate: 64 (10/28 1241) Intake/Output from previous day:   Intake/Output from this shift:    Labs:  Recent Labs  02/17/15 0847  WBC 6.5  HGB 9.1*  PLT 169  CREATININE 0.74   Estimated Creatinine Clearance: 42.9 mL/min (by C-G formula based on Cr of 0.74). No results for input(s): VANCOTROUGH, VANCOPEAK, VANCORANDOM, GENTTROUGH, GENTPEAK, GENTRANDOM, TOBRATROUGH, TOBRAPEAK, TOBRARND, AMIKACINPEAK, AMIKACINTROU, AMIKACIN in the last 72 hours.   Microbiology: Recent Results (from the past 720 hour(s))  MRSA PCR Screening     Status: None   Collection Time: 01/28/15  3:40 PM  Result Value Ref Range Status   MRSA by PCR NEGATIVE NEGATIVE Final    Comment:        The GeneXpert MRSA Assay (FDA approved for NASAL specimens only), is one component of a comprehensive MRSA colonization surveillance program. It is not intended to diagnose MRSA infection nor to guide or monitor treatment for MRSA  infections.   Urine culture     Status: None   Collection Time: 01/28/15  5:23 PM  Result Value Ref Range Status   Specimen Description URINE, CLEAN CATCH  Final   Special Requests NONE  Final   Culture   Final    50,000 COLONIES/mL KLEBSIELLA PNEUMONIAE >=100,000 COLONIES/mL ESCHERICHIA COLI Results Called to: DONNISHA ROBERTSON AT 1212 ON 10/102/16 CTJ ESBL-EXTENDED SPECTRUM BETA LACTAMASE-THE ORGANISM IS RESISTANT TO PENICILLINS, CEPHALOSPORINS AND AZTREONAM ACCORDING TO CLSI M100-S15 VOL.[place].    Report Status 02/01/2015 FINAL  Final   Organism ID, Bacteria KLEBSIELLA PNEUMONIAE  Final   Organism ID, Bacteria ESCHERICHIA COLI  Final      Susceptibility   Escherichia coli - MIC*    AMPICILLIN >=32 RESISTANT Resistant     CEFAZOLIN >=64 RESISTANT Resistant     CEFTRIAXONE >=64 RESISTANT Resistant     CIPROFLOXACIN >=4 RESISTANT Resistant     GENTAMICIN <=1 SENSITIVE Sensitive     IMIPENEM <=0.25 SENSITIVE Sensitive     NITROFURANTOIN <=16 SENSITIVE Sensitive     TRIMETH/SULFA >=320 RESISTANT Resistant     Extended ESBL POSITIVE Resistant     PIP/TAZO Value in next row Sensitive      SENSITIVE<=4    ERTAPENEM Value in next row Sensitive      SENSITIVE<=0.5    * >=100,000 COLONIES/mL ESCHERICHIA COLI   Klebsiella pneumoniae - MIC*    AMPICILLIN Value in next row Resistant      SENSITIVE<=0.5    CEFAZOLIN Value in next row Sensitive  SENSITIVE<=0.5    CEFTRIAXONE Value in next row Sensitive      SENSITIVE<=0.5    CIPROFLOXACIN Value in next row Sensitive      SENSITIVE<=0.5    GENTAMICIN Value in next row Sensitive      SENSITIVE<=0.5    IMIPENEM Value in next row Sensitive      SENSITIVE<=0.5    NITROFURANTOIN Value in next row Intermediate      SENSITIVE<=0.5    TRIMETH/SULFA Value in next row Sensitive      SENSITIVE<=0.5    PIP/TAZO Value in next row Sensitive      SENSITIVE<=4    ERTAPENEM Value in next row Sensitive      SENSITIVE<=0.5    *  50,000 COLONIES/mL KLEBSIELLA PNEUMONIAE  Urine culture     Status: None   Collection Time: 01/29/15  7:00 AM  Result Value Ref Range Status   Specimen Description URINE, CLEAN CATCH  Final   Special Requests NONE  Final   Culture   Final    >=100,000 COLONIES/mL ESCHERICHIA COLI Results Called to: BRANDY MANSFIELD AT 1349 01/31/15 CTJ ESBL-EXTENDED SPECTRUM BETA LACTAMASE-THE ORGANISM IS RESISTANT TO PENICILLINS, CEPHALOSPORINS AND AZTREONAM ACCORDING TO CLSI M100-S15 VOL.[place].    Report Status 01/31/2015 FINAL  Final   Organism ID, Bacteria ESCHERICHIA COLI  Final      Susceptibility   Escherichia coli - MIC*    AMPICILLIN >=32 RESISTANT Resistant     CEFAZOLIN >=64 RESISTANT Resistant     CEFTRIAXONE >=64 RESISTANT Resistant     CIPROFLOXACIN >=4 RESISTANT Resistant     GENTAMICIN <=1 SENSITIVE Sensitive     IMIPENEM <=0.25 SENSITIVE Sensitive     NITROFURANTOIN <=16 SENSITIVE Sensitive     TRIMETH/SULFA >=320 RESISTANT Resistant     Extended ESBL POSITIVE Resistant     PIP/TAZO Value in next row Sensitive      SENSITIVE<=4    * >=100,000 COLONIES/mL ESCHERICHIA COLI  Urine culture     Status: None   Collection Time: 02/06/15 10:12 AM  Result Value Ref Range Status   Specimen Description URINE, RANDOM  Final   Special Requests Normal  Final   Culture 40,000 COLONIES/ml CANDIDA ALBICANS  Final   Report Status 02/09/2015 FINAL  Final    Medical History: Past Medical History  Diagnosis Date  . Hypertension   . Arthritis     osteoarthritis  . Hyperlipidemia   . Temporal giant cell arteritis (HCC)     left eye  . Atrial fibrillation ([place])   . GERD (gastroesophageal reflux disease)   . CHF (congestive heart failure) ([place])   . Glaucoma   . DJD (degenerative joint disease)   . TIA (transient ischemic attack)   . Bell's palsy   . Cancer ([place])     skin    Medications:  Scheduled:  . aspirin  162.5 mg Oral Daily  . benazepril  20 mg Oral Daily  .  hydrALAZINE  25 mg Oral 3 times per day  . imipenem-cilastatin  500 mg Intravenous 3 times per day  . latanoprost  1 drop Right Eye QHS  . magnesium oxide  400 mg Oral Daily  . metoprolol succinate  25 mg Oral Daily  . oxybutynin  5 mg Oral QHS  . pantoprazole  40 mg Oral Daily  . sodium chloride  3 mL Intravenous Q12H  . vitamin B-12  1,000 mcg Oral Daily  . [START ON 02/22/2015] warfarin  2 mg Oral Q  Wed  . warfarin  3 mg Oral Once per day on Sun Mon Tue Thu Fri Sat  . Warfarin - Physician Dosing Inpatient   Does not apply q1800   Assessment: Patient is a 81 yo female admitted with CVA and UTI.  Patient with history of ESBL UTI on previous admission.  Patient discharged on Macrobid 100 mg po BID.  MD consulted pharmacy to dose imipenem for UTI.  Patient with allergy to Penicillins and cefuroxime.  Had tolerated Meropenem in the past.   UCx: pending  Est CrCl~43 mL/min  Goal of Therapy:  Resolution of infection  Plan:  Will renally adjust Imipenem 500 mg IV q6h to 500 mg IV q8h based on renal function and weight.   Follow up culture results   Pharmacy will continue to follow.  Kenji Mapel G 02/17/2015,1:28 PM

## 2015-02-17 NOTE — Plan of Care (Signed)
Problem: Discharge/Transitional Outcomes Goal: Other Discharge Outcomes/Goals Outcome: Progressing Pt admitted for CVA from [facility], family present, pt confused unable to perform all of the NIH r/t confusion and pt unable to follow commands, PICC placed per orders, ivf per orders, pt on contact precautions for ESBL last admission

## 2015-02-18 LAB — BASIC METABOLIC PANEL
ANION GAP: 9 (ref 5–15)
BUN: 16 mg/dL (ref 6–20)
CALCIUM: 8.5 mg/dL — AB (ref 8.9–10.3)
CHLORIDE: 94 mmol/L — AB (ref 101–111)
CO2: 23 mmol/L (ref 22–32)
CREATININE: 0.62 mg/dL (ref 0.44–1.00)
Glucose, Bld: 107 mg/dL — ABNORMAL HIGH (ref 65–99)
POTASSIUM: 3.4 mmol/L — AB (ref 3.5–5.1)
Sodium: 126 mmol/L — ABNORMAL LOW (ref 135–145)

## 2015-02-18 LAB — CBC
HCT: 28.7 % — ABNORMAL LOW (ref 35.0–47.0)
Hemoglobin: 9.5 g/dL — ABNORMAL LOW (ref 12.0–16.0)
MCH: 28.9 pg (ref 26.0–34.0)
MCHC: 33.2 g/dL (ref 32.0–36.0)
MCV: 86.8 fL (ref 80.0–100.0)
PLATELETS: 181 10*3/uL (ref 150–440)
RBC: 3.3 MIL/uL — AB (ref 3.80–5.20)
RDW: 15.7 % — AB (ref 11.5–14.5)
WBC: 6.8 10*3/uL (ref 3.6–11.0)

## 2015-02-18 LAB — PROTIME-INR
INR: 2.21
PROTHROMBIN TIME: 24.7 s — AB (ref 11.4–15.0)

## 2015-02-18 MED ORDER — POTASSIUM CHLORIDE IN NACL 20-0.9 MEQ/L-% IV SOLN
INTRAVENOUS | Status: DC
  Administered 2015-02-18: 10:00:00 via INTRAVENOUS
  Filled 2015-02-18 (×2): qty 1000

## 2015-02-18 MED ORDER — IPRATROPIUM-ALBUTEROL 0.5-2.5 (3) MG/3ML IN SOLN
3.0000 mL | RESPIRATORY_TRACT | Status: DC | PRN
  Administered 2015-02-19: 21:00:00 3 mL via RESPIRATORY_TRACT
  Filled 2015-02-18: qty 3

## 2015-02-18 NOTE — Progress Notes (Signed)
Initial Nutrition Assessment    INTERVENTION:   Meals and Snacks: Cater to patient preferences; discussed preferences with pt family and orders placed in computer Medical Food Supplement Therapy: recommend addition of honey thick mighty shakes TID with meals; pt has tried magic cup in the past and did not like but has not tried the orange cream flavor and does like orange sherbet. Family would like pt to try again, will send [employer] cup at lunch and Dinner  NUTRITION DIAGNOSIS:   Inadequate oral intake related to poor appetite as evidenced by per patient/family report.  GOAL:   Patient will meet greater than or equal to 90% of their needs  MONITOR:    (Energy intake, Anthropometrics, Electrolyte/Renal profile, Digestive System)  REASON FOR ASSESSMENT:   Malnutrition Screening Tool    ASSESSMENT:    Pt admitted with AMS, CVA  Past Medical History  Diagnosis Date  . Hypertension   . Arthritis     osteoarthritis  . Hyperlipidemia   . Temporal giant cell arteritis (HCC)     left eye  . Atrial fibrillation ([place])   . GERD (gastroesophageal reflux disease)   . CHF (congestive heart failure) ([place])   . Glaucoma   . DJD (degenerative joint disease)   . TIA (transient ischemic attack)   . Bell's palsy   . Cancer ([place])     skin     Diet Order:  DIET - DYS 1 Room service appropriate?: Yes; Fluid consistency:: Honey Thick ; SLP following  Energy Intake: pt ate fairly well at lunch today; ate all of the mashed potaotes and gravy, fruit and juice; pt ate most of the [place] and some of the veggie. Appetite fairly good  Food and nutrition related history: family reports pt's appetite is up and down, depends on what she is served at the SNF and what kind of mood she is in. Pt does sometimes take nutritional supplements  Skin:   (stage II coccyx)   Recent Labs Lab 02/17/15 0847 02/18/15 0515  NA 122* 126*  K 4.0 3.4*  CL 88* 94*  CO2 25 23  BUN 22* 16  CREATININE  0.74 0.62  CALCIUM 8.8* 8.5*  GLUCOSE 121* 107*    Meds: reviewed  Nutrition Focused physical exam:  Unable to complete Nutrition-Focused physical exam at this time.    Height:   Ht Readings from Last 1 Encounters:  02/17/15 5\' 6"  (1.676 m)    Weight: family is not sure about wt trend  Wt Readings from Last 1 Encounters:  02/17/15 160 lb (72.576 kg)    Ideal Body Weight:     BMI:  Body mass index is 25.84 kg/(m^2).  Estimated Nutritional Needs:   Kcal:  1454-1745 kcals (BEE 1212, AF 1.2, IF 1.0-1.2 IF)   Protein:  72-86 g (1.0-1.2 g/kg)   Fluid:  1800-2160 mL (25-30 ml/kg)     MODERATE Care Level  Kerman Passey MS, RD, LDN [phone] Pager

## 2015-02-18 NOTE — Evaluation (Signed)
Physical Therapy Evaluation Patient Details Name: Joyce Vasquez MRN: [id number] DOB: 1917/02/17 Today's Date: 02/18/2015   History of Present Illness  Joyce Vasquez is a 81 y.o. female with a known history of chronic A. fib on Coumadin, hypertension, hyperlipidemia, glaucoma who was recently discharged from [facility] to [facility]. Pt returend to [facility] due to confusion and hypnatremia. She was admitted for acute metabolic encephalopathy, hyponatremia, and UTI. At baseline pt lives at [facility] independent living. Prior to hospital admissions in October 2016 she was independent with ADLs and went to dining facility for meals. Daufther provides history as patient is HOH. Pt is AOx3 at time of evaluation. Of note pt has history of Bell's Palsy with chronic L facial droop.   Clinical Impression  Pt is very weak requiring considerable assist with all mobility. She is highly motivated and able to follow all therapists commands. She potentially presents with minimal LUE weakness but unclear if this is new onset. Facial drooping, upper and lower, is chronic due to Bell's Palsy. Pt is very unsteady in standing with RLE giving way once resulting in return to sitting on bed. Pt able to take small steps at bedside but is too unsteady on this date for further ambulation. Pt will need to return to SNF but does not want to go back to [facility]. Family would like bed search for other facilities. Pt will benefit from skilled PT services to address deficits in strength, balance, and mobility in order to return to full function at home.     Follow Up Recommendations SNF (Family does not want pt to return to [facility])    Equipment Recommendations  None recommended by PT (pt already has 4ww)    Recommendations for Other Services       Precautions / Restrictions Precautions Precautions: Fall Restrictions Weight Bearing Restrictions: No      Mobility  Bed Mobility Overal bed mobility: Needs  Assistance Bed Mobility: Supine to Sit     Supine to sit: Min assist     General bed mobility comments: Poor sequencing noted requiring assist to sit upright at EOB due to poor trunk righting secondary to weakness  Transfers Overall transfer level: Needs assistance Equipment used: Rolling walker (2 wheeled) Transfers: Sit to/from Stand Sit to Stand: Mod assist         General transfer comment: Pt uses bed on back of knees for support during sit to stand. Posterior leaning with poor balance in standing requiring intermittent assist to prevent falling. Standing marching performed at EOB  Ambulation/Gait Ambulation/Gait assistance: Mod assist Ambulation Distance (Feet): 3 Feet Assistive device: Rolling walker (2 wheeled) Gait Pattern/deviations: Step-to pattern     General Gait Details: Pt takes a few small steps forward and sidestepping at EOB with therapist. Pt does have one episode of RLE giving way with uncontrolled descent onto bed. Pt with poor balance and unsafe to attempt further ambulation without additional assistance on this date.  Stairs            Wheelchair Mobility    Modified Rankin (Stroke Patients Only)       Balance Overall balance assessment: Needs assistance   Sitting balance-Leahy Scale: Fair       Standing balance-Leahy Scale: Poor                               Pertinent Vitals/Pain Pain Assessment: No/denies pain    Home  Living Family/patient expects to be discharged to:: Unsure Living Arrangements: Children             Home Equipment: Gilford Rile - 4 wheels;Shower seat (walk-in shower, no BSC, no wheelchair) Additional Comments: pt independent with ambulation and assistive device; gets help with changing bed dressing and meals are provided at dining hall. No assistance for dressing and bathing. However since onset of recent admissions pt has been very weak needing assist with ADLs    Prior Function Level of  Independence: Independent with assistive device(s) (Prior to recent admissions)         Comments: Pt is independent with her rollator at [facility] where she often helps with a weekly exercise class. Recently she has been limited in mobility needing assist for ambulation     Hand Dominance        Extremity/Trunk Assessment   Upper Extremity Assessment: Overall WFL for tasks assessed;LUE deficits/detail       LUE Deficits / Details: Possible mild L shoulder flexion and L elbow flexion weakness but varying effort provided by patient. Pt does have L facial droop, upper and lower which is baseline for her secondary to Bell's Palsy   Lower Extremity Assessment: Generalized weakness (4- to 4/5 throughout. No focal weakness)         Communication   Communication: HOH  Cognition Arousal/Alertness: Awake/alert Behavior During Therapy: WFL for tasks assessed/performed Overall Cognitive Status: Within Functional Limits for tasks assessed                      General Comments      Exercises        Assessment/Plan    PT Assessment Patient needs continued PT services  PT Diagnosis Generalized weakness;Difficulty walking;Abnormality of gait   PT Problem List Decreased strength;Decreased activity tolerance;Decreased mobility;Decreased balance;Decreased safety awareness  PT Treatment Interventions DME instruction;Gait training;Functional mobility training;Therapeutic activities;Therapeutic exercise;Balance training   PT Goals (Current goals can be found in the Care Plan section) Acute Rehab PT Goals Patient Stated Goal: [facility] Goal Formulation: With patient/family Time For Goal Achievement: 03/04/15 Potential to Achieve Goals: Good    Frequency Min 2X/week   Barriers to discharge Decreased caregiver support      Co-evaluation               End of Session Equipment Utilized During Treatment: Gait belt Activity  Tolerance: Patient tolerated treatment well Patient left: in chair;with family/visitor present;with call bell/phone within reach;with chair alarm set Nurse Communication: Mobility status         Time: 1305-1340 PT Time Calculation (min) (ACUTE ONLY): 35 min   Charges:   PT Evaluation $Initial PT Evaluation Tier I: 1 Procedure     PT G Codes:       Lyndel Safe Necia Kamm PT, DPT   Eunie Lawn 02/18/2015, 3:06 PM

## 2015-02-18 NOTE — Evaluation (Signed)
Clinical/Bedside Swallow Evaluation Patient Details  Name: Joyce Vasquez MRN: [id number] Date of Birth: 10-Jan-1917  Today's Date: 02/18/2015 Time: SLP Start Time (ACUTE ONLY): 0815 SLP Stop Time (ACUTE ONLY): 0844 SLP Time Calculation (min) (ACUTE ONLY): 29 min  Past Medical History:  Past Medical History  Diagnosis Date  . Hypertension   . Arthritis     osteoarthritis  . Hyperlipidemia   . Temporal giant cell arteritis (HCC)     left eye  . Atrial fibrillation ([place])   . GERD (gastroesophageal reflux disease)   . CHF (congestive heart failure) ([place])   . Glaucoma   . DJD (degenerative joint disease)   . TIA (transient ischemic attack)   . Bell's palsy   . Cancer [facility])     skin   Past Surgical History:  Past Surgical History  Procedure Laterality Date  . Carotid endarterectomy Right   . Appendectomy    . Abdominal hysterectomy    . Cholecystectomy    . Cardiac catheterization    . Joint replacement Bilateral     Right and Left knee Replacement  . Breast surgery Left     Excisional Breast Biopsy  . Tonsillectomy    . Eye surgery Right     Cataract Extraction--right eye only  . Pacemaker insertion Left 11/29/2014    Procedure: INSERTION PACEMAKER;  Surgeon: Isaias Cowman, MD;  Location: [facility] ORS;  Service: Cardiovascular;  Laterality: Left;   HPI:  pt was being seen in skilled nursing for dysphagia and on a puree diet with thin liquids with no straws. pt was admitted with altered mental status.   Assessment / Plan / Recommendation Clinical Impression  pt presents with a moderate to severe oral pharyngeal dysphagia charactorized by immediate and delayed coughing and throat clear with thin liquids, and nectar thick liquids. pt has decreased oral motor strength and is recommended to remain on a puree diet with honey thick liquids at this time. pt was previously on a puree diet in the skilled nursing home prior to readmission to hospital due to oral motor deficits and pt  removal of foods from oral cavity due to texture and inability to masticate appropriatly.     Aspiration Risk  Moderate    Diet Recommendation Dysphagia 1 (Puree);Honey   Medication Administration: Crushed with puree Compensations: Check for pocketing;Follow solids with liquid;Slow rate    Other  Recommendations Oral Care Recommendations: Oral care BID   Follow Up Recommendations       Frequency and Duration min 3x week  1 week   Pertinent Vitals/Pain No pain reported    SLP Swallow Goals     Swallow Study Prior Functional Status       General Date of Onset: 02/17/15 Other Pertinent Information: pt was being seen in skilled nursing for dysphagia and on a puree diet with thin liquids with no straws. pt was admitted with altered mental status. Type of Study: Bedside swallow evaluation Diet Prior to this Study: NPO Temperature Spikes Noted: N/A Respiratory Status: Room air History of Recent Intubation: No Behavior/Cognition: Alert;Confused;Cooperative Oral Cavity - Dentition: Adequate natural dentition/normal for age Self-Feeding Abilities: Able to feed self Patient Positioning: Upright in bed Baseline Vocal Quality: Normal    Oral/Motor/Sensory Function Overall Oral Motor/Sensory Function: Impaired Labial ROM: Reduced left;Within Functional Limits Labial Symmetry: Abnormal symmetry left Labial Strength: Within Functional Limits Lingual Strength: Reduced   Ice Chips Ice chips: Not tested   Thin Liquid Thin Liquid: Impaired Oral Phase Impairments: Reduced labial  seal Oral Phase Functional Implications: Prolonged oral transit Pharyngeal  Phase Impairments: Throat Clearing - Delayed;Cough - Immediate;Cough - Delayed    Nectar Thick Nectar Thick Liquid: Impaired Oral Phase Impairments: Reduced labial seal Pharyngeal Phase Impairments: Throat Clearing - Delayed;Cough - Delayed   Honey Thick Honey Thick Liquid: Within functional limits Presentation: Cup   Puree Puree:  Within functional limits Presentation: Spoon;Self Fed   Solid   GO    Solid: Not tested       [place] Sauber 02/18/2015,11:30 AM

## 2015-02-18 NOTE — Plan of Care (Signed)
Problem: Discharge/Transitional Outcomes Goal: Other Discharge Outcomes/Goals Outcome: Progressing Plan of care progress to goals: Barriers to progression: patient is confused at baseline, difficult to complete stroke scale and neurological assessment Educational plan- assess each shift for educational needs, educate family and reinforce previous education with patient.  Hemodynamically stable- afebrile this shift, BP slightly elevated, Dr. Bridgett Larsson notified and new order for Hydralazine 10mg  IV q4hours PRN for SBP >180.  Independent mobility- pt needs assistance with ambulation. Pt states that she uses a walker at home.  Tolerating diet- pt continues to be NPO per MD order, awaiting speech evaluation.  Family and pt agree on discharge plan- pt is from [facility]. Pt's family has stated that they do not want the pt to return to [facility]. Care management consult ordered. Family caregiver willing and able to support plan- pt is from SNF and will need to return to skilled nursing at time of discharge.  Ability to attain medications- pt has no difficulty obtaining medications, SNF.   Pt is alert, confused to place, time, and situation. NIH score is "6", difficult to assess sensation d/t pt confusion. Pt continues to be NPO with oral care performed. BP slightly elevated, MD notified and new order obtained for PRN Hydralazine for SBP>180, given once with good effect. Pt slightly agitated this morning, Haldol given with little improvement noted. Telemetry AFibb/paced. IVF infusing per MD order. Pt voids incontinent. Dressing to right lower leg dry and intact. Dressing to sacral, dry and intact.

## 2015-02-18 NOTE — Progress Notes (Signed)
[facility] [place] NAME: Joyce Vasquez    MR#:  [id number]  DATE OF BIRTH:  [birth year]  SUBJECTIVE:  CHIEF COMPLAINT:   Chief Complaint  Patient presents with  . Altered Mental Status   patient is a 81 year old female with history of A. fib on Coumadin therapy, hypertension, CHF, Bell's palsy, hyperlipidemia who came into the hospital with confusion, hyponatremia , she was noted to be pyuria and because of concern of recurrence of ESBL infection, she was initiated on imipenem, IV fluids. She feels better today. She is less confused, denies any pain or shortness of breath.   Review of Systems  Unable to perform ROS: mental acuity    VITAL SIGNS: Blood pressure 188/57, pulse 78, temperature 97.9 F (36.6 C), temperature source Oral, resp. rate 20, height 5\' 6"  (1.676 m), weight 72.576 kg (160 lb), SpO2 97 %.  PHYSICAL EXAMINATION:   GENERAL:  81 y.o.-year-old patient lying in the bed with no acute distress. Mild left facial weakness including forehead/orbitalis Muscle EYES: Pupils equal, round, reactive to light and accommodation. No scleral icterus. Extraocular muscles intact.  HEENT: Head atraumatic, normocephalic. Oropharynx and nasopharynx clear.  NECK:  Supple, no jugular venous distention. No thyroid enlargement, no tenderness.  LUNGS: Normal breath sounds bilaterally, no wheezing, rales,rhonchi or crepitation. No use of accessory muscles of respiration.  CARDIOVASCULAR: S1, S2 normal. No murmurs, rubs, or gallops.  ABDOMEN: Soft, nontender, nondistended. Bowel sounds present. No organomegaly or mass.  EXTREMITIES: No pedal edema, cyanosis, or clubbing.  NEUROLOGIC: Cranial nerves II through XII are intact. Muscle strength 5/5 in all extremities. Sensation intact. Gait not checked.  PSYCHIATRIC: The patient is alert and oriented x 2.  SKIN: No obvious rash, lesion, or ulcer.   ORDERS/RESULTS REVIEWED:   CBC  Recent  Labs Lab 02/17/15 0847 02/18/15 0515  WBC 6.5 6.8  HGB 9.1* 9.5*  HCT 28.0* 28.7*  PLT 169 181  MCV 86.2 86.8  MCH 28.2 28.9  MCHC 32.6 33.2  RDW 15.1* 15.7*  LYMPHSABS 0.9*  --   MONOABS 0.7  --   EOSABS 0.1  --   BASOSABS 0.0  --    ------------------------------------------------------------------------------------------------------------------  Chemistries   Recent Labs Lab 02/17/15 0847 02/18/15 0515  NA 122* 126*  K 4.0 3.4*  CL 88* 94*  CO2 25 23  GLUCOSE 121* 107*  BUN 22* 16  CREATININE 0.74 0.62  CALCIUM 8.8* 8.5*   ------------------------------------------------------------------------------------------------------------------ estimated creatinine clearance is 42.9 mL/min (by C-G formula based on Cr of 0.62). ------------------------------------------------------------------------------------------------------------------ No results for input(s): TSH, T4TOTAL, T3FREE, THYROIDAB in the last 72 hours.  Invalid input(s): FREET3  Cardiac Enzymes  Recent Labs Lab 02/17/15 0847  TROPONINI <0.03   ------------------------------------------------------------------------------------------------------------------ Invalid input(s): POCBNP ---------------------------------------------------------------------------------------------------------------  RADIOLOGY: Ct Head Wo Contrast  02/17/2015  CLINICAL DATA:  Altered mental status.  Left facial droop. EXAM: CT HEAD WITHOUT CONTRAST TECHNIQUE: Contiguous axial images were obtained from the base of the skull through the vertex without intravenous contrast. COMPARISON:  12/04/2014 FINDINGS: There is no evidence of mass effect, midline shift, or extra-axial fluid collections. There is no evidence of a space-occupying lesion or intracranial hemorrhage. There is no evidence of a cortical-based area of acute infarction. There is generalized cerebral atrophy. There is periventricular white matter low attenuation likely  secondary to microangiopathy. The ventricles and sulci are appropriate for the patient's age. The basal cisterns are patent. Visualized portions of the orbits are unremarkable. The visualized portions  of the paranasal sinuses and mastoid air cells are unremarkable. Cerebrovascular atherosclerotic calcifications are noted. The osseous structures are unremarkable. IMPRESSION: 1. No acute intracranial pathology. 2. Chronic microvascular disease and cerebral atrophy. Electronically Signed   By: Kathreen Devoid   On: 02/17/2015 09:25   Dg Chest Port 1 View  02/17/2015  CLINICAL DATA:  81 year old female with PICC line placement. EXAM: PORTABLE CHEST 1 VIEW COMPARISON:  02/01/2015 FINDINGS: Cardiomegaly and left AICD again noted. A right PICC line is present with tip overlying the superior cavoatrial junction. Pulmonary vascular congestion, bibasilar atelectasis/ airspace disease and small effusions again noted. There is no evidence of pneumothorax. IMPRESSION: Right PICC line with tip overlying the superior cavoatrial junction. Otherwise unchanged chest with cardiomegaly, pulmonary vascular congestion, bibasilar atelectasis/ airspace disease and small effusions. Electronically Signed   By: Margarette Canada M.D.   On: 02/17/2015 14:25    EKG:  Orders placed or performed during the hospital encounter of 02/17/15  . ED EKG  . ED EKG    ASSESSMENT AND PLAN:  Principal Problem:   CVA (cerebral infarction) Active Problems:   Encephalopathy acute   UTI (lower urinary tract infection)   Pressure ulcer 1. Acute metabolic Encephalopathy due to hyponatremia as well as urinary tract infection, improved significantly , discontinue neuro checks, unlikely stroke as patient has only all the left-sided facial weakness due to remote Bell's palsy. Following clinically. Continue Haldol as needed 2. Hyponatremia, improved on IV fluids. Patient's urine osmolarity was found to be 238, which is low and is not consistent with  SIADH, continue normal saline infusion for likely dehydration 3. Urinary tract infection, concerning for recurrence of Escherichia coli, ESBL , off nitrofurantoin. Continue patient on imipenem , PICC line was placed 28th of October 2016. Patient should continue antibiotic therapy intravenously to complete course, likely discharged to skilled nursing facility with intravenous antibiotics, depending on culture results, so far blood cultures are negative. Repeat urine cultures 4. Anemia, getting guaiac to rule out a GI bleed as patient is on Coumadin therapy, although even with rehydration Patient's hemoglobin remained stable 5. Hyperglycemia. hemoglobin A1c 5.7 , effectively rule out diabetes 6. Generalized weakness , getting physical therapist involved, likely discharge back to skilled nursing facility with IV antibiotics to complete course on Monday after cultures are known.    Management plans discussed with the patient, family and they are in agreement.   DRUG ALLERGIES:  Allergies  Allergen Reactions  . Coreg [Carvedilol]   . Erythromycin Base Nausea And Vomiting  . Penicillins Swelling    "swelling of throat" Has patient had a PCN reaction causing immediate rash, facial/tongue/throat swelling, SOB or lightheadedness with hypotension: Yes Has patient had a PCN reaction causing severe rash involving mucus membranes or skin necrosis: No Has patient had a PCN reaction that required hospitalization No Has patient had a PCN reaction occurring within the last 10 years: No If all of the above answers are "NO", then may proceed with Cephalosporin use.  . Vitamin D Analogs   . Cefuroxime Axetil Nausea Only and Rash  . Morphine And Related Rash    CODE STATUS:     Code Status Orders        Start     Ordered   02/17/15 1340  Do not attempt resuscitation (DNR)   Continuous    Question Answer Comment  In the event of cardiac or respiratory ARREST Do not call a "code blue"   In the event  of cardiac or  respiratory ARREST Do not perform Intubation, CPR, defibrillation or ACLS   In the event of cardiac or respiratory ARREST Use medication by any route, position, wound care, and other measures to relive pain and suffering. May use oxygen, suction and manual treatment of airway obstruction as needed for comfort.      02/17/15 1339    Advance Directive Documentation        Most Recent Value   Type of Advance Directive  Out of facility DNR (pink MOST or yellow form)   Pre-existing out of facility DNR order (yellow form or pink MOST form)  Physician notified to receive inpatient order   "MOST" Form in Place?        TOTAL TIME TAKING CARE OF THIS PATIENT: 40 minutes.    Theodoro Grist M.D on 02/18/2015 at 10:34 AM  Between 7am to 6pm - Pager - [phone]  After 6pm go to [email or web address] - password [email or web address] [facility]  Office  [phone]  CC: Primary care physician; Chrisandra Carota, MD

## 2015-02-18 NOTE — Plan of Care (Signed)
Problem: Discharge/Transitional Outcomes Goal: Other Discharge Outcomes/Goals Outcome: Progressing VSS. Denies pain. Much more alert today, follow commands. Neuro checks performed , no changes during the shift. Sodium level 126, IVF infusing. Urine culture collected today via In&out cath. Continue ABX. Up to the chair today with PT, tolerated well. Tolerates diet. Pt wheezes upon shift change, lungs diminished, O2 sats in the high 90's on room air, IVF infusing. Dr Darvin Neighbours notified. Nebs ordered. Resiratory therapist and night RN aware.

## 2015-02-19 ENCOUNTER — Inpatient Hospital Stay: Payer: Medicare Other

## 2015-02-19 DIAGNOSIS — E871 Hypo-osmolality and hyponatremia: Secondary | ICD-10-CM

## 2015-02-19 DIAGNOSIS — I10 Essential (primary) hypertension: Secondary | ICD-10-CM

## 2015-02-19 LAB — URINE CULTURE

## 2015-02-19 LAB — BASIC METABOLIC PANEL
ANION GAP: 7 (ref 5–15)
BUN: 16 mg/dL (ref 6–20)
CHLORIDE: 96 mmol/L — AB (ref 101–111)
CO2: 22 mmol/L (ref 22–32)
Calcium: 8.7 mg/dL — ABNORMAL LOW (ref 8.9–10.3)
Creatinine, Ser: 0.59 mg/dL (ref 0.44–1.00)
GFR calc Af Amer: >60 mL/min (ref >60)
GLUCOSE: 141 mg/dL — AB (ref 65–99)
POTASSIUM: 3.8 mmol/L (ref 3.5–5.1)
SODIUM: 125 mmol/L — AB (ref 135–145)

## 2015-02-19 LAB — PROTIME-INR
INR: 2.28
PROTHROMBIN TIME: 25.3 s — AB (ref 11.4–15.0)

## 2015-02-19 MED ORDER — HALOPERIDOL 2 MG PO TABS
2.0000 mg | ORAL_TABLET | Freq: Two times a day (BID) | ORAL | Status: DC
Start: 2015-02-19 — End: 2015-02-21
  Administered 2015-02-19 – 2015-02-21 (×5): 2 mg via ORAL
  Filled 2015-02-19 (×6): qty 1

## 2015-02-19 NOTE — Progress Notes (Signed)
Spoke with Dr.Willis that patient refuses to wear the cardiac monitor. DX CVA, chronic afib. Has been afib on the monitor in the 70s. MD aware patient is not in monitor. ICU notified patient is not on the monitor, and will keep order active.

## 2015-02-19 NOTE — Progress Notes (Signed)
Prn svn given for wheezing at RN request.  Pt noted to have upper airway wheeze with clear lung fields and in no distress. No change in wheeze after svn given.

## 2015-02-19 NOTE — Progress Notes (Signed)
[facility] [place] NAME: Joyce Vasquez    MR#:  [id number]  DATE OF BIRTH:  Jun 30, 1916  SUBJECTIVE:  CHIEF COMPLAINT:   Chief Complaint  Patient presents with  . Altered Mental Status   patient is a 81 year old female with history of A. fib on Coumadin therapy, hypertension, CHF, Bell's palsy, hyperlipidemia who came into the hospital with confusion, hyponatremia , she was noted to be pyuria and because of concern of recurrence of ESBL infection, she was initiated on imipenem, IV fluids. More confused today. Doubt PICC line and refuses to wear cardiac monitor, denies any discomfort Review of Systems  Unable to perform ROS: mental acuity    VITAL SIGNS: Blood pressure 199/70, pulse 62, temperature 97.4 F (36.3 C), temperature source Oral, resp. rate 20, height 5\' 6"  (1.676 m), weight 72.576 kg (160 lb), SpO2 100 %.  PHYSICAL EXAMINATION:   GENERAL:  81 y.o.-year-old patient lying in the bed with no acute distress. Mild left facial weakness including forehead/orbitalis Muscle. Confused, restless EYES: Pupils equal, round, reactive to light and accommodation. No scleral icterus. Extraocular muscles intact.  HEENT: Head atraumatic, normocephalic. Oropharynx and nasopharynx clear.  NECK:  Supple, no jugular venous distention. No thyroid enlargement, no tenderness.  LUNGS: Normal breath sounds bilaterally, no wheezing, rales,rhonchi or crepitation. No use of accessory muscles of respiration.  CARDIOVASCULAR: S1, S2 normal. No murmurs, rubs, or gallops.  ABDOMEN: Soft, nontender, nondistended. Bowel sounds present. No organomegaly or mass.  EXTREMITIES: No pedal edema, cyanosis, or clubbing.  NEUROLOGIC: Cranial nerves II through XII are intact. Muscle strength 5/5 in all extremities. Sensation intact. Gait not checked.  PSYCHIATRIC: The patient is alert and confused .  SKIN: No obvious rash, lesion, or ulcer.   ORDERS/RESULTS REVIEWED:    CBC  Recent Labs Lab 02/17/15 0847 02/18/15 0515  WBC 6.5 6.8  HGB 9.1* 9.5*  HCT 28.0* 28.7*  PLT 169 181  MCV 86.2 86.8  MCH 28.2 28.9  MCHC 32.6 33.2  RDW 15.1* 15.7*  LYMPHSABS 0.9*  --   MONOABS 0.7  --   EOSABS 0.1  --   BASOSABS 0.0  --    ------------------------------------------------------------------------------------------------------------------  Chemistries   Recent Labs Lab 02/17/15 0847 02/18/15 0515 02/19/15 0433  NA 122* 126* 125*  K 4.0 3.4* 3.8  CL 88* 94* 96*  CO2 25 23 22   GLUCOSE 121* 107* 141*  BUN 22* 16 16  CREATININE 0.74 0.62 0.59  CALCIUM 8.8* 8.5* 8.7*   ------------------------------------------------------------------------------------------------------------------ estimated creatinine clearance is 42.9 mL/min (by C-G formula based on Cr of 0.59). ------------------------------------------------------------------------------------------------------------------ No results for input(s): TSH, T4TOTAL, T3FREE, THYROIDAB in the last 72 hours.  Invalid input(s): FREET3  Cardiac Enzymes  Recent Labs Lab 02/17/15 0847  TROPONINI <0.03   ------------------------------------------------------------------------------------------------------------------ Invalid input(s): POCBNP ---------------------------------------------------------------------------------------------------------------  RADIOLOGY: Dg Chest 1 View  02/19/2015  CLINICAL DATA:  PICC line placement, previous line pulled out. EXAM: CHEST 1 VIEW COMPARISON:  Chest x-rays dated 02/17/2015 and 02/01/2015. FINDINGS: Right-sided PICC line in place with tip well-positioned in the expected region of the cavoatrial junction. Cardiomegaly appears unchanged. Overall cardiomediastinal silhouette is stable in size and configuration. Left chest wall pacemaker/ AICD is stable in position. Opacities at each lung base are likely a combination of atelectasis and small effusions. The mild  central pulmonary vascular congestion is not significantly changed. IMPRESSION: Right-sided PICC line placed with tip well-positioned in the expected region of the cavoatrial junction. No procedural complicating features  seen. Otherwise stable chest x-ray. Mild central pulmonary vascular congestion is not significantly changed. Bibasilar airspace opacities are unchanged and likely represent a combination of atelectasis and small effusions. Electronically Signed   By: Franki Cabot M.D.   On: 02/19/2015 12:00   Dg Chest Port 1 View  02/17/2015  CLINICAL DATA:  81 year old female with PICC line placement. EXAM: PORTABLE CHEST 1 VIEW COMPARISON:  02/01/2015 FINDINGS: Cardiomegaly and left AICD again noted. A right PICC line is present with tip overlying the superior cavoatrial junction. Pulmonary vascular congestion, bibasilar atelectasis/ airspace disease and small effusions again noted. There is no evidence of pneumothorax. IMPRESSION: Right PICC line with tip overlying the superior cavoatrial junction. Otherwise unchanged chest with cardiomegaly, pulmonary vascular congestion, bibasilar atelectasis/ airspace disease and small effusions. Electronically Signed   By: Margarette Canada M.D.   On: 02/17/2015 14:25    EKG:  Orders placed or performed during the hospital encounter of 02/17/15  . ED EKG  . ED EKG  . EKG 12-Lead  . EKG 12-Lead    ASSESSMENT AND PLAN:  Principal Problem:   CVA (cerebral infarction) Active Problems:   Encephalopathy acute   UTI (lower urinary tract infection)   Pressure ulcer 1. Acute metabolic Encephalopathy and delirium due to hyponatremia as well as urinary tract infection, worse today.  I doubt underlying acute stroke as patient had only left-sided facial weakness due to remote Bell's palsy. Stable clinically. Continue Haldol around the clock and as needed 2. Hyponatremia, initially improved on IV fluids, however, now sodium level is slightly lower than yesterday.  Patient's urine osmolarity was found to be 238, which is low and is not consistent with SIADH, discontinue normal saline infusion, as patient's oral intake is satisfactory and follow sodium level tomorrow morning 3. Urinary tract infection, concerning for recurrence of Escherichia coli, ESBL , off nitrofurantoin. Continue patient on imipenem , PICC line was placed 28th of October 2016, today patient on 02/18/2015 and now, replaced on 02/19/2015.  Repeating urine cultures, get ID involved 4. Anemia, getting guaiac to rule out a GI bleed as patient is on Coumadin therapy, although even with rehydration Patient's hemoglobin remained stable, following in the morning 5. Hyperglycemia. hemoglobin A1c 5.7 , effectively rule out diabetes 6. Generalized weakness , getting physical therapist involved, likely discharge back to skilled nursing facility  on Monday or Tuesday after urinary cultures are known.    Management plans discussed with the patient, family and they are in agreement.   DRUG ALLERGIES:  Allergies  Allergen Reactions  . Coreg [Carvedilol]   . Erythromycin Base Nausea And Vomiting  . Penicillins Swelling    "swelling of throat" Has patient had a PCN reaction causing immediate rash, facial/tongue/throat swelling, SOB or lightheadedness with hypotension: Yes Has patient had a PCN reaction causing severe rash involving mucus membranes or skin necrosis: No Has patient had a PCN reaction that required hospitalization No Has patient had a PCN reaction occurring within the last 10 years: No If all of the above answers are "NO", then may proceed with Cephalosporin use.  . Vitamin D Analogs   . Cefuroxime Axetil Nausea Only and Rash  . Morphine And Related Rash    CODE STATUS:     Code Status Orders        Start     Ordered   02/17/15 1340  Do not attempt resuscitation (DNR)   Continuous    Question Answer Comment  In the event of cardiac or respiratory ARREST  Do not call a "code  blue"   In the event of cardiac or respiratory ARREST Do not perform Intubation, CPR, defibrillation or ACLS   In the event of cardiac or respiratory ARREST Use medication by any route, position, wound care, and other measures to relive pain and suffering. May use oxygen, suction and manual treatment of airway obstruction as needed for comfort.      02/17/15 1339    Advance Directive Documentation        Most Recent Value   Type of Advance Directive  Out of facility DNR (pink MOST or yellow form)   Pre-existing out of facility DNR order (yellow form or pink MOST form)  Physician notified to receive inpatient order   "MOST" Form in Place?        TOTAL TIME TAKING CARE OF THIS PATIENT: 40 minutes.    Theodoro Grist M.D on 02/19/2015 at 1:05 PM  Between 7am to 6pm - Pager - [phone]  After 6pm go to [email or web address] - password [email or web address] [facility]  Office  [phone]  CC: Primary care physician; Chrisandra Carota, MD

## 2015-02-19 NOTE — Progress Notes (Signed)
Pt pulled out picc line. Very confused. MD notified that pressure was held, and CVW was called to inform and will contact staff about reinsert. Asked MD for sitter and sitter ordered.

## 2015-02-19 NOTE — Progress Notes (Signed)
RT called to room by RN Stanton Kidney to assess patient for respiratory distress.  Patient in no distress when RT arrived to room, sitting comfortably in bed, BBS slightly diminished with fine crackles on right side.  O2 sat on room air 100%. RN Stanton Kidney made aware of RT assessment.  No changes in respiratory care at this time.

## 2015-02-19 NOTE — Progress Notes (Addendum)
Picc line in specimen bag for CVW to observe. Based on observation from 2 RNs including this writer, tip does not look intact. Will need further confirmation.   Spoke to charge nurse on dayshift and because line is 43 cm and the end is cut, the line was intact.

## 2015-02-19 NOTE — Plan of Care (Signed)
Problem: Discharge/Transitional Outcomes Goal: Other Discharge Outcomes/Goals Outcome: Progressing Pt has some confusion. Now on scheduled haldol and has PRN available.  Pt has 24 hour sitter.  New PICC inserted after pt pulled one out.  Pt didn't c/o pain. Pt has respiratory wheezes but O2 sats are good above 95%.  Had respiratory come look at her - don't feel like she's in respiratory distress.  Pt was admitted for hyponatremia - it's still low at 125, but pt can't tolerate fluids.  Has some generalized edema.  Hgb is stable. Pt did really well on physical assessment part of stroke eval.  Does have L side facial droop but this is from Bell's palsy.  Pt has UTI and is on IV abx.

## 2015-02-19 NOTE — Progress Notes (Signed)
Notified by nursing that patient refuses to wear cardiac monitor.  Jacqulyn Bath [facility] 02/19/2015, 1:08 AM

## 2015-02-20 DIAGNOSIS — E785 Hyperlipidemia, unspecified: Secondary | ICD-10-CM

## 2015-02-20 DIAGNOSIS — I509 Heart failure, unspecified: Secondary | ICD-10-CM

## 2015-02-20 DIAGNOSIS — D649 Anemia, unspecified: Secondary | ICD-10-CM

## 2015-02-20 DIAGNOSIS — I482 Chronic atrial fibrillation, unspecified: Secondary | ICD-10-CM

## 2015-02-20 DIAGNOSIS — Z7901 Long term (current) use of anticoagulants: Secondary | ICD-10-CM

## 2015-02-20 DIAGNOSIS — I10 Essential (primary) hypertension: Secondary | ICD-10-CM

## 2015-02-20 DIAGNOSIS — R531 Weakness: Secondary | ICD-10-CM

## 2015-02-20 DIAGNOSIS — G510 Bell's palsy: Secondary | ICD-10-CM

## 2015-02-20 DIAGNOSIS — R739 Hyperglycemia, unspecified: Secondary | ICD-10-CM

## 2015-02-20 LAB — BASIC METABOLIC PANEL
ANION GAP: 5 (ref 5–15)
BUN: 16 mg/dL (ref 6–20)
CHLORIDE: 97 mmol/L — AB (ref 101–111)
CO2: 25 mmol/L (ref 22–32)
Calcium: 8.6 mg/dL — ABNORMAL LOW (ref 8.9–10.3)
Creatinine, Ser: 0.69 mg/dL (ref 0.44–1.00)
GFR calc Af Amer: >60 mL/min (ref >60)
Glucose, Bld: 88 mg/dL (ref 65–99)
POTASSIUM: 3.8 mmol/L (ref 3.5–5.1)
SODIUM: 127 mmol/L — AB (ref 135–145)

## 2015-02-20 LAB — HEMOGLOBIN: HEMOGLOBIN: 7.9 g/dL — AB (ref 12.0–16.0)

## 2015-02-20 LAB — PROTIME-INR
INR: 3.23
Prothrombin Time: 33 seconds — ABNORMAL HIGH (ref 11.4–15.0)

## 2015-02-20 MED ORDER — HALOPERIDOL 1 MG PO TABS: 1.0000 mg | ORAL_TABLET | Freq: Two times a day (BID) | ORAL | Status: AC

## 2015-02-20 MED ORDER — ASPIRIN EC 81 MG PO TBEC: 81.0000 mg | DELAYED_RELEASE_TABLET | Freq: Every day | ORAL | Status: AC

## 2015-02-20 MED ORDER — MINERAL OIL RE ENEM
1.0000 | ENEMA | Freq: Once | RECTAL | Status: AC
  Administered 2015-02-20: 1 via RECTAL

## 2015-02-20 NOTE — Care Management (Signed)
Admitted to [facility] with the diagnosis of CVA. Discharged from this facility to [facility] 02/07/15. Daughter is Epimenio Foot [phone]). Lived at [facility] prior to [facility]. Seen Dr. Vickki Muff in the past. [place] in the past. [facility] in the past. Physical therapy evaluation completed. Recommends skilled nursing facility. Doesn't want to return to [facility] per daughter. Discharge today per Dr. Ether Griffins. Shelbie Ammons RN MSN CCM Care Management [phone]

## 2015-02-20 NOTE — Discharge Summary (Addendum)
[facility] [place] NAME: Joyce Vasquez    MR#:  [id number]  DATE OF BIRTH:  1916/06/10  DATE OF ADMISSION:  02/17/2015 ADMITTING PHYSICIAN: Theodoro Grist, MD  DATE OF DISCHARGE: No discharge date for patient encounter.  PRIMARY CARE PHYSICIAN: Chrisandra Carota, MD     ADMISSION DIAGNOSIS:  Facial weakness [R29.810] Hyponatremia [E87.1] Tongue deviation [K14.8] Urinary tract infection without hematuria, site unspecified [N39.0] Altered mental status, unspecified altered mental status type [R41.82]  DISCHARGE DIAGNOSIS:  Principal Problem:   Encephalopathy acute Active Problems:   Dementia with behavioral disturbance   Pyuria, sterile   Anticoagulated on Coumadin   Pressure ulcer   Anemia   Hyperglycemia   Generalized weakness   Left-sided Bell's palsy   Chronic atrial fibrillation (HCC)   Essential hypertension   Chronic CHF ([place])   Hyperlipidemia   SECONDARY DIAGNOSIS:   Past Medical History  Diagnosis Date  . Hypertension   . Arthritis     osteoarthritis  . Hyperlipidemia   . Temporal giant cell arteritis (HCC)     left eye  . Atrial fibrillation ([place])   . GERD (gastroesophageal reflux disease)   . CHF (congestive heart failure) ([place])   . Glaucoma   . DJD (degenerative joint disease)   . TIA (transient ischemic attack)   . Bell's palsy   . Cancer ([place])     skin    .pro HOSPITAL COURSE:  The patient is a 81 year old female with history of A. fib on Coumadin therapy, hypertension, CHF, Bell's palsy, hyperlipidemia who came into the hospital with confusion, hyponatremia , worsening left sided facial weakness , she was noted to have pyuria and because of concern of recurrence of ESBL infection, she was initiated on imipenem, IV fluids. PICC line was placed and replaced after patient was through it due to confusion. She was managed on IV antibiotic. However, her urine cultures came back negative. With IV fluid  administration and improvement of her sodium level as well as management of her dementia with behavioral disturbances with Haldol her condition overall improved and she was felt to be ready to be discharged back to skilled nursing facility for rehabilitation Discussion by problem 1. Acute metabolic Encephalopathy due to hyponatremia , improved on IV fluids and Haldol. I doubt underlying acute stroke as patient had only left-sided facial weakness due to remote Bell's palsy, which improved overall. Stable clinically at present is ready to be discharged to skilled nursing facility. Continue Haldol 1 mg twice daily dose, advance as needed. Patient was managed with bedside sitter while in the hospital intermittently, which she may need as outpatient as well.  2. Hyponatremia, likely dehydration related,  improved on IV fluids. Patient's urine osmolarity was found to be 238, which is low and is not consistent with SIADH. Follow sodium level closely as outpatient. 3. Pyuria, urine cultures were negative for infection , continue suppressive therapy with nitrofurantoin at 100 mg once daily dose.  4. Anemia, guaiac was ordered, however, not obtained , it is important to have Hemoccult checked to rule out a GI bleed as patient is on Coumadin therapy, although even with rehydration Patient's hemoglobin remained stable.  5. Hyperglycemia. hemoglobin A1c 5.7 , effectively rule out diabetes 6. Generalized weakness , appreciate physical therapist input, discharge to skilled nursing facility today, discussed with care management  DISCHARGE CONDITIONS:   Stable  CONSULTS OBTAINED:     DRUG ALLERGIES:   Allergies  Allergen Reactions  .  Coreg [Carvedilol]   . Erythromycin Base Nausea And Vomiting  . Penicillins Swelling    "swelling of throat" Has patient had a PCN reaction causing immediate rash, facial/tongue/throat swelling, SOB or lightheadedness with hypotension: Yes Has patient had a PCN reaction causing  severe rash involving mucus membranes or skin necrosis: No Has patient had a PCN reaction that required hospitalization No Has patient had a PCN reaction occurring within the last 10 years: No If all of the above answers are "NO", then may proceed with Cephalosporin use.  . Vitamin D Analogs   . Cefuroxime Axetil Nausea Only and Rash  . Morphine And Related Rash    DISCHARGE MEDICATIONS:   Current Discharge Medication List    START taking these medications   Details  aspirin EC 81 MG tablet Take 1 tablet (81 mg total) by mouth daily. Qty: 30 tablet, Refills: 0    haloperidol (HALDOL) 1 MG tablet Take 1 tablet (1 mg total) by mouth 2 (two) times daily. Qty: 60 tablet, Refills: 6      CONTINUE these medications which have NOT CHANGED   Details  benazepril (LOTENSIN) 20 MG tablet Take 1 tablet (20 mg total) by mouth daily. Qty: 30 tablet, Refills: 0    bimatoprost (LUMIGAN) 0.03 % ophthalmic solution Place 1 drop into the right eye at bedtime.    calcium carbonate (TUMS - DOSED IN MG ELEMENTAL CALCIUM) 500 MG chewable tablet Chew 1 tablet (200 mg of elemental calcium total) by mouth 2 (two) times daily with a meal. Qty: 120 tablet, Refills: 0    conjugated estrogens (PREMARIN) vaginal cream Place 1 Applicatorful vaginally daily. Qty: 42.5 g, Refills: 2    hydrALAZINE (APRESOLINE) 25 MG tablet Take 1 tablet (25 mg total) by mouth every 8 (eight) hours. Qty: 60 tablet, Refills: 0    Magnesium Oxide 400 (240 MG) MG TABS Take 400 mg by mouth daily. Qty: 30 tablet, Refills: 0    metoprolol succinate (TOPROL-XL) 25 MG 24 hr tablet Take 25 mg by mouth daily.     nitrofurantoin (MACRODANTIN) 100 MG capsule Take 100 mg by mouth daily.    nitroGLYCERIN (NITROSTAT) 0.4 MG SL tablet Place 0.4 mg under the tongue every 5 (five) minutes as needed for chest pain. After 3rd dose if no relief call 911    omeprazole (PRILOSEC) 20 MG capsule Take 20 mg by mouth daily.    oxybutynin  (DITROPAN-XL) 5 MG 24 hr tablet Take 5 mg by mouth at bedtime.    vitamin B-12 (CYANOCOBALAMIN) 1000 MCG tablet Take 1,000 mcg by mouth daily.    !! warfarin (COUMADIN) 2 MG tablet Take 2 mg by mouth every Wednesday.     !! warfarin (COUMADIN) 3 MG tablet Take 3 mg by mouth See admin instructions. Take 1 tablet orally every day except on Wednesday     !! - Potential duplicate medications found. Please discuss with provider.    STOP taking these medications     aspirin 325 MG tablet      potassium chloride (KLOR-CON 10) 10 MEQ tablet          DISCHARGE INSTRUCTIONS:    Patient is to follow-up with primary care physician, follow sodium levels closely  If you experience worsening of your admission symptoms, develop shortness of breath, life threatening emergency, suicidal or homicidal thoughts you must seek medical attention immediately by calling 911 or calling your MD immediately  if symptoms less severe.  You Must read complete instructions/literature along  with all the possible adverse reactions/side effects for all the Medicines you take and that have been prescribed to you. Take any new Medicines after you have completely understood and accept all the possible adverse reactions/side effects.   Please note  You were cared for by a hospitalist during your hospital stay. If you have any questions about your discharge medications or the care you received while you were in the hospital after you are discharged, you can call the unit and asked to speak with the hospitalist on call if the hospitalist that took care of you is not available. Once you are discharged, your primary care physician will handle any further medical issues. Please note that NO REFILLS for any discharge medications will be authorized once you are discharged, as it is imperative that you return to your primary care physician (or establish a relationship with a primary care physician if you do not have one) for your  aftercare needs so that they can reassess your need for medications and monitor your lab values.    Today   CHIEF COMPLAINT:   Chief Complaint  Patient presents with  . Altered Mental Status    HISTORY OF PRESENT ILLNESS:  Joyce Vasquez  is a 81 y.o. female with a known history of A. fib on Coumadin therapy, hypertension, CHF, Bell's palsy, hyperlipidemia who came into the hospital with confusion, hyponatremia , worsening left sided facial weakness , she was noted to have pyuria and because of concern of recurrence of ESBL infection, she was initiated on imipenem, IV fluids. PICC line was placed and replaced after patient was through it due to confusion. She was managed on IV antibiotic. However, her urine cultures came back negative. With IV fluid administration and improvement of her sodium level as well as management of her dementia with behavioral disturbances with Haldol her condition overall improved and she was felt to be ready to be discharged back to skilled nursing facility for rehabilitation Discussion by problem 1. Acute metabolic Encephalopathy due to hyponatremia , improved on IV fluids and Haldol. I doubt underlying acute stroke as patient had only left-sided facial weakness due to remote Bell's palsy, which improved overall. Stable clinically at present is ready to be discharged to skilled nursing facility. Continue Haldol 1 mg twice daily dose, advance as needed. Patient was managed with bedside sitter while in the hospital intermittently, which she may need as outpatient as well.  2. Hyponatremia, likely dehydration related,  improved on IV fluids. Patient's urine osmolarity was found to be 238, which is low and is not consistent with SIADH. Follow sodium level closely as outpatient. 3. Pyuria, urine cultures were negative for infection , continue suppressive therapy with nitrofurantoin at 100 mg once daily dose.  4. Anemia, guaiac was ordered, however, not obtained , it is  important to have Hemoccult checked to rule out a GI bleed as patient is on Coumadin therapy, although even with rehydration Patient's hemoglobin remained stable.  5. Hyperglycemia. hemoglobin A1c 5.7 , effectively rule out diabetes 6. Generalized weakness , appreciate physical therapist input, discharge to skilled nursing facility today, discussed with care management    VITAL SIGNS:  Blood pressure 116/53, pulse 65, temperature 98 F (36.7 C), temperature source Oral, resp. rate 20, height 5\' 6"  (1.676 m), weight 72.576 kg (160 lb), SpO2 97 %.  I/O:   Intake/Output Summary (Last 24 hours) at 02/20/15 0825 Last data filed at 02/19/15 1202  Gross per 24 hour  Intake  3 ml  Output      0 ml  Net      3 ml    PHYSICAL EXAMINATION:  GENERAL:  81 y.o.-year-old patient lying in the bed with no acute distress. Left facial weakness including forehead EYES: Pupils equal, round, reactive to light and accommodation. No scleral icterus. Extraocular muscles intact.  HEENT: Head atraumatic, normocephalic. Oropharynx and nasopharynx clear.  NECK:  Supple, no jugular venous distention. No thyroid enlargement, no tenderness.  LUNGS: Normal breath sounds bilaterally, no wheezing, rales,rhonchi or crepitation. No use of accessory muscles of respiration.  CARDIOVASCULAR: S1, S2 normal. No murmurs, rubs, or gallops.  ABDOMEN: Soft, non-tender, non-distended. Bowel sounds present. No organomegaly or mass.  EXTREMITIES: No pedal edema, cyanosis, or clubbing.  NEUROLOGIC: Cranial nerves II through XII are intact. Muscle strength 5/5 in all extremities. Sensation intact. Gait not checked.  PSYCHIATRIC: The patient is alert and oriented x 3.  SKIN: No obvious rash, lesion, or ulcer.   DATA REVIEW:   CBC  Recent Labs Lab 02/18/15 0515  WBC 6.8  HGB 9.5*  HCT 28.7*  PLT 181    Chemistries   Recent Labs Lab 02/20/15 0446  NA 127*  K 3.8  CL 97*  CO2 25  GLUCOSE 88  BUN 16  CREATININE  0.69  CALCIUM 8.6*    Cardiac Enzymes  Recent Labs Lab 02/17/15 0847  TROPONINI <0.03    Microbiology Results  Results for orders placed or performed during the hospital encounter of 02/17/15  Urine culture     Status: None   Collection Time: 02/17/15  8:47 AM  Result Value Ref Range Status   Specimen Description URINE, RANDOM  Final   Special Requests NONE  Final   Culture MULTIPLE SPECIES PRESENT, SUGGEST RECOLLECTION  Final   Report Status 02/19/2015 FINAL  Final  Culture, blood (routine x 2)     Status: None (Preliminary result)   Collection Time: 02/17/15 11:52 AM  Result Value Ref Range Status   Specimen Description BLOOD RIGHT ASSIST CONTROL  Final   Special Requests BOTTLES DRAWN AEROBIC AND ANAEROBIC [place]  Final   Culture NO GROWTH 2 DAYS  Final   Report Status PENDING  Incomplete  Culture, blood (routine x 2)     Status: None (Preliminary result)   Collection Time: 02/17/15 11:57 AM  Result Value Ref Range Status   Specimen Description BLOOD RIGHT HAND  Final   Special Requests BOTTLES DRAWN AEROBIC AND ANAEROBIC 6CC  Final   Culture NO GROWTH 2 DAYS  Final   Report Status PENDING  Incomplete  Urine culture     Status: None (Preliminary result)   Collection Time: 02/18/15  3:45 PM  Result Value Ref Range Status   Specimen Description URINE, CATHETERIZED  Final   Special Requests Normal  Final   Culture NO GROWTH < 24 HOURS  Final   Report Status PENDING  Incomplete    RADIOLOGY:  Dg Chest 1 View  02/19/2015  CLINICAL DATA:  PICC line placement, previous line pulled out. EXAM: CHEST 1 VIEW COMPARISON:  Chest x-rays dated 02/17/2015 and 02/01/2015. FINDINGS: Right-sided PICC line in place with tip well-positioned in the expected region of the cavoatrial junction. Cardiomegaly appears unchanged. Overall cardiomediastinal silhouette is stable in size and configuration. Left chest wall pacemaker/ AICD is stable in position. Opacities at each lung base are likely a  combination of atelectasis and small effusions. The mild central pulmonary vascular congestion is not significantly changed. IMPRESSION:  Right-sided PICC line placed with tip well-positioned in the expected region of the cavoatrial junction. No procedural complicating features seen. Otherwise stable chest x-ray. Mild central pulmonary vascular congestion is not significantly changed. Bibasilar airspace opacities are unchanged and likely represent a combination of atelectasis and small effusions. Electronically Signed   By: Franki Cabot M.D.   On: 02/19/2015 12:00    EKG:   Orders placed or performed during the hospital encounter of 02/17/15  . ED EKG  . ED EKG  . EKG 12-Lead  . EKG 12-Lead      Management plans discussed with the patient, family and they are in agreement.  CODE STATUS:     Code Status Orders        Start     Ordered   02/17/15 1340  Do not attempt resuscitation (DNR)   Continuous    Question Answer Comment  In the event of cardiac or respiratory ARREST Do not call a "code blue"   In the event of cardiac or respiratory ARREST Do not perform Intubation, CPR, defibrillation or ACLS   In the event of cardiac or respiratory ARREST Use medication by any route, position, wound care, and other measures to relive pain and suffering. May use oxygen, suction and manual treatment of airway obstruction as needed for comfort.      02/17/15 1339    Advance Directive Documentation        Most Recent Value   Type of Advance Directive  Out of facility DNR (pink MOST or yellow form)   Pre-existing out of facility DNR order (yellow form or pink MOST form)  Physician notified to receive inpatient order   "MOST" Form in Place?        TOTAL TIME TAKING CARE OF THIS PATIENT: 50 minutes.  Coordination of care time 15 minutes on discussion with family and discharge planning  Godwin Tedesco M.D on 02/20/2015 at 8:25 AM  Between 7am to 6pm - Pager - [phone]  After 6pm go to  [email or web address] - password [email or web address] [facility]  Office  [phone]  CC: Primary care physician; Chrisandra Carota, MD

## 2015-02-20 NOTE — Care Management Important Message (Signed)
Important Message  Patient Details  Name: Joyce Vasquez MRN: [id number] Date of Birth: 24-Nov-1916   Medicare Important Message Given:  Yes-third notification given    Shelbie Ammons, RN 02/20/2015, 11:41 AM

## 2015-02-20 NOTE — Progress Notes (Signed)
ANTICOAGULATION CONSULT NOTE - Initial Consult  Pharmacy Consult for warfarin Indication: atrial fibrillation  Allergies  Allergen Reactions  . Coreg [Carvedilol]   . Erythromycin Base Nausea And Vomiting  . Penicillins Swelling    "swelling of throat" Has patient had a PCN reaction causing immediate rash, facial/tongue/throat swelling, SOB or lightheadedness with hypotension: Yes Has patient had a PCN reaction causing severe rash involving mucus membranes or skin necrosis: No Has patient had a PCN reaction that required hospitalization No Has patient had a PCN reaction occurring within the last 10 years: No If all of the above answers are "NO", then may proceed with Cephalosporin use.  . Vitamin D Analogs   . Cefuroxime Axetil Nausea Only and Rash  . Morphine And Related Rash    Patient Measurements: Height: 5\' 6"  (167.6 cm) Weight: 160 lb (72.576 kg) IBW/kg (Calculated) : 59.3   Vital Signs: Temp: 99 F (37.2 C) (10/31 1415) Temp Source: Oral (10/31 1415) BP: 145/50 mmHg (10/31 1415) Pulse Rate: 66 (10/31 1415)  Labs:  Recent Labs  02/18/15 0515 02/19/15 0433 02/19/15 0812 02/20/15 0446  HGB 9.5*  --   --  7.9*  HCT 28.7*  --   --   --   PLT 181  --   --   --   LABPROT 24.7*  --  25.3* 33.0*  INR 2.21  --  2.28 3.23  CREATININE 0.62 0.59  --  0.69    Estimated Creatinine Clearance: 42.9 mL/min (by C-G formula based on Cr of 0.69).   Medical History: Past Medical History  Diagnosis Date  . Hypertension   . Arthritis     osteoarthritis  . Hyperlipidemia   . Temporal giant cell arteritis (HCC)     left eye  . Atrial fibrillation ([place])   . GERD (gastroesophageal reflux disease)   . CHF (congestive heart failure) ([place])   . Glaucoma   . DJD (degenerative joint disease)   . TIA (transient ischemic attack)   . Bell's palsy   . Cancer ([place])     skin    Medications:  Scheduled:  . antiseptic oral rinse  7 mL Mouth Rinse BID  . aspirin  162.5 mg Oral  Daily  . benazepril  20 mg Oral Daily  . haloperidol  2 mg Oral BID  . hydrALAZINE  25 mg Oral 3 times per day  . imipenem-cilastatin  500 mg Intravenous 3 times per day  . latanoprost  1 drop Right Eye QHS  . magnesium oxide  400 mg Oral Daily  . metoprolol succinate  25 mg Oral Daily  . mineral oil  1 enema Rectal Once  . oxybutynin  5 mg Oral QHS  . pantoprazole  40 mg Oral Daily  . sodium chloride  3 mL Intravenous Q12H  . vitamin B-12  1,000 mcg Oral Daily    Assessment: Patient is a 81 yo female with history of a fib on warfarin as outpatient.  Outpatient dosing of warfarin 3 mg daily except 2 mg on Wednesdays.   INR today of 3.23  Goal of Therapy:  INR 2-3 Monitor CBC per policy   Plan:  Patient's INR is supratherapeutic today at 3.23.  Will hold today's dose and recheck INR in AM.  Patient is on antibiotic therapy with Primaxin IV for history of ESBL E Coli UTI.  Antibiotics may potentiate warfarin's effects on the INR.   Pharmacy will continue to follow.  Joyce Vasquez G 02/20/2015,2:45 PM

## 2015-02-20 NOTE — Progress Notes (Addendum)
ANTIBIOTIC CONSULT NOTE -Follow Up  Pharmacy Consult for Imipenem Indication: UTI  Allergies  Allergen Reactions  . Coreg [Carvedilol]   . Erythromycin Base Nausea And Vomiting  . Penicillins Swelling    "swelling of throat" Has patient had a PCN reaction causing immediate rash, facial/tongue/throat swelling, SOB or lightheadedness with hypotension: Yes Has patient had a PCN reaction causing severe rash involving mucus membranes or skin necrosis: No Has patient had a PCN reaction that required hospitalization No Has patient had a PCN reaction occurring within the last 10 years: No If all of the above answers are "NO", then may proceed with Cephalosporin use.  . Vitamin D Analogs   . Cefuroxime Axetil Nausea Only and Rash  . Morphine And Related Rash    Patient Measurements: Height: 5\' 6"  (167.6 cm) Weight: 160 lb (72.576 kg) IBW/kg (Calculated) : 59.3  Vital Signs: Temp: 98 F (36.7 C) (10/31 0530) Temp Source: Oral (10/31 0530) BP: 116/53 mmHg (10/31 0530) Pulse Rate: 65 (10/31 0530) Intake/Output from previous day: 10/30 0701 - 10/31 0700 In: 3 [I.V.:3] Out: -  Intake/Output from this shift:    Labs:  Recent Labs  02/17/15 0847 02/18/15 0515 02/19/15 0433 02/20/15 0446  WBC 6.5 6.8  --   --   HGB 9.1* 9.5*  --   --   PLT 169 181  --   --   CREATININE 0.74 0.62 0.59 0.69   Estimated Creatinine Clearance: 42.9 mL/min (by C-G formula based on Cr of 0.69). No results for input(s): VANCOTROUGH, VANCOPEAK, VANCORANDOM, GENTTROUGH, GENTPEAK, GENTRANDOM, TOBRATROUGH, TOBRAPEAK, TOBRARND, AMIKACINPEAK, AMIKACINTROU, AMIKACIN in the last 72 hours.   Microbiology: Recent Results (from the past 720 hour(s))  MRSA PCR Screening     Status: None   Collection Time: 01/28/15  3:40 PM  Result Value Ref Range Status   MRSA by PCR NEGATIVE NEGATIVE Final    Comment:        The GeneXpert MRSA Assay (FDA approved for NASAL specimens only), is one component of  a comprehensive MRSA colonization surveillance program. It is not intended to diagnose MRSA infection nor to guide or monitor treatment for MRSA infections.   Urine culture     Status: None   Collection Time: 01/28/15  5:23 PM  Result Value Ref Range Status   Specimen Description URINE, CLEAN CATCH  Final   Special Requests NONE  Final   Culture   Final    50,000 COLONIES/mL KLEBSIELLA PNEUMONIAE >=100,000 COLONIES/mL ESCHERICHIA COLI Results Called to: DONNISHA ROBERTSON AT 1212 ON 10/102/16 CTJ ESBL-EXTENDED SPECTRUM BETA LACTAMASE-THE ORGANISM IS RESISTANT TO PENICILLINS, CEPHALOSPORINS AND AZTREONAM ACCORDING TO CLSI M100-S15 VOL.[place].    Report Status 02/01/2015 FINAL  Final   Organism ID, Bacteria KLEBSIELLA PNEUMONIAE  Final   Organism ID, Bacteria ESCHERICHIA COLI  Final      Susceptibility   Escherichia coli - MIC*    AMPICILLIN >=32 RESISTANT Resistant     CEFAZOLIN >=64 RESISTANT Resistant     CEFTRIAXONE >=64 RESISTANT Resistant     CIPROFLOXACIN >=4 RESISTANT Resistant     GENTAMICIN <=1 SENSITIVE Sensitive     IMIPENEM <=0.25 SENSITIVE Sensitive     NITROFURANTOIN <=16 SENSITIVE Sensitive     TRIMETH/SULFA >=320 RESISTANT Resistant     Extended ESBL POSITIVE Resistant     PIP/TAZO Value in next row Sensitive      SENSITIVE<=4    ERTAPENEM Value in next row Sensitive      SENSITIVE<=0.5    * >=  100,000 COLONIES/mL ESCHERICHIA COLI   Klebsiella pneumoniae - MIC*    AMPICILLIN Value in next row Resistant      SENSITIVE<=0.5    CEFAZOLIN Value in next row Sensitive      SENSITIVE<=0.5    CEFTRIAXONE Value in next row Sensitive      SENSITIVE<=0.5    CIPROFLOXACIN Value in next row Sensitive      SENSITIVE<=0.5    GENTAMICIN Value in next row Sensitive      SENSITIVE<=0.5    IMIPENEM Value in next row Sensitive      SENSITIVE<=0.5    NITROFURANTOIN Value in next row Intermediate      SENSITIVE<=0.5    TRIMETH/SULFA Value in next row Sensitive       SENSITIVE<=0.5    PIP/TAZO Value in next row Sensitive      SENSITIVE<=4    ERTAPENEM Value in next row Sensitive      SENSITIVE<=0.5    * 50,000 COLONIES/mL KLEBSIELLA PNEUMONIAE  Urine culture     Status: None   Collection Time: 01/29/15  7:00 AM  Result Value Ref Range Status   Specimen Description URINE, CLEAN CATCH  Final   Special Requests NONE  Final   Culture   Final    >=100,000 COLONIES/mL ESCHERICHIA COLI Results Called to: BRANDY MANSFIELD AT 1349 01/31/15 CTJ ESBL-EXTENDED SPECTRUM BETA LACTAMASE-THE ORGANISM IS RESISTANT TO PENICILLINS, CEPHALOSPORINS AND AZTREONAM ACCORDING TO CLSI M100-S15 VOL.[place].    Report Status 01/31/2015 FINAL  Final   Organism ID, Bacteria ESCHERICHIA COLI  Final      Susceptibility   Escherichia coli - MIC*    AMPICILLIN >=32 RESISTANT Resistant     CEFAZOLIN >=64 RESISTANT Resistant     CEFTRIAXONE >=64 RESISTANT Resistant     CIPROFLOXACIN >=4 RESISTANT Resistant     GENTAMICIN <=1 SENSITIVE Sensitive     IMIPENEM <=0.25 SENSITIVE Sensitive     NITROFURANTOIN <=16 SENSITIVE Sensitive     TRIMETH/SULFA >=320 RESISTANT Resistant     Extended ESBL POSITIVE Resistant     PIP/TAZO Value in next row Sensitive      SENSITIVE<=4    * >=100,000 COLONIES/mL ESCHERICHIA COLI  Urine culture     Status: None   Collection Time: 02/06/15 10:12 AM  Result Value Ref Range Status   Specimen Description URINE, RANDOM  Final   Special Requests Normal  Final   Culture 40,000 COLONIES/ml CANDIDA ALBICANS  Final   Report Status 02/09/2015 FINAL  Final  Urine culture     Status: None   Collection Time: 02/17/15  8:47 AM  Result Value Ref Range Status   Specimen Description URINE, RANDOM  Final   Special Requests NONE  Final   Culture MULTIPLE SPECIES PRESENT, SUGGEST RECOLLECTION  Final   Report Status 02/19/2015 FINAL  Final  Culture, blood (routine x 2)     Status: None (Preliminary result)   Collection Time: 02/17/15 11:52 AM   Result Value Ref Range Status   Specimen Description BLOOD RIGHT ASSIST CONTROL  Final   Special Requests BOTTLES DRAWN AEROBIC AND ANAEROBIC [place]  Final   Culture NO GROWTH 2 DAYS  Final   Report Status PENDING  Incomplete  Culture, blood (routine x 2)     Status: None (Preliminary result)   Collection Time: 02/17/15 11:57 AM  Result Value Ref Range Status   Specimen Description BLOOD RIGHT HAND  Final   Special Requests BOTTLES DRAWN AEROBIC AND ANAEROBIC 6CC  Final  Culture NO GROWTH 2 DAYS  Final   Report Status PENDING  Incomplete  Urine culture     Status: None (Preliminary result)   Collection Time: 02/18/15  3:45 PM  Result Value Ref Range Status   Specimen Description URINE, CATHETERIZED  Final   Special Requests Normal  Final   Culture NO GROWTH < 24 HOURS  Final   Report Status PENDING  Incomplete    Medical History: Past Medical History  Diagnosis Date  . Hypertension   . Arthritis     osteoarthritis  . Hyperlipidemia   . Temporal giant cell arteritis (HCC)     left eye  . Atrial fibrillation ([place])   . GERD (gastroesophageal reflux disease)   . CHF (congestive heart failure) ([place])   . Glaucoma   . DJD (degenerative joint disease)   . TIA (transient ischemic attack)   . Bell's palsy   . Cancer ([place])     skin    Medications:  Scheduled:  . antiseptic oral rinse  7 mL Mouth Rinse BID  . aspirin  162.5 mg Oral Daily  . benazepril  20 mg Oral Daily  . haloperidol  2 mg Oral BID  . hydrALAZINE  25 mg Oral 3 times per day  . imipenem-cilastatin  500 mg Intravenous 3 times per day  . latanoprost  1 drop Right Eye QHS  . magnesium oxide  400 mg Oral Daily  . metoprolol succinate  25 mg Oral Daily  . mineral oil  1 enema Rectal Once  . oxybutynin  5 mg Oral QHS  . pantoprazole  40 mg Oral Daily  . sodium chloride  3 mL Intravenous Q12H  . vitamin B-12  1,000 mcg Oral Daily  . [START ON 02/22/2015] warfarin  2 mg Oral Q Wed  . warfarin  3 mg Oral Once per  day on Sun Mon Tue Thu Fri Sat  . Warfarin - Physician Dosing Inpatient   Does not apply q1800   Assessment: Patient is a 81 yo female admitted with CVA and UTI.  Patient with history of ESBL UTI on previous admission.  Patient discharged on Macrobid 100 mg po BID.  MD consulted pharmacy to dose imipenem for UTI.  Patient with allergy to Penicillins and cefuroxime.  Has tolerated Meropenem in the past.   UCx: NGTD  Est CrCl~43 mL/min  Goal of Therapy:  Resolution of infection  Plan:  Will renally adjust Imipenem 500 mg IV q6h to 500 mg IV q8h based on renal function and weight.   Follow up culture results   Pharmacy will continue to follow.  Darragh Nay G 02/20/2015,8:03 AM

## 2015-02-20 NOTE — NC FL2 (Signed)
[place] LEVEL OF CARE SCREENING TOOL     IDENTIFICATION  Patient Name: Joyce Vasquez Birthdate: 03-30-1917 Sex: female Admission Date (Current Location): 02/17/2015  [facility] [place] [facility] [place] Number: Engineering geologist and Address:  [facility], [address] [place], [place] [address]      Provider Number: [id number]  Attending Physician Name and Address:  Theodoro Grist, MD  Relative Name and Phone Number:       Current Level of Care: SNF Recommended Level of Care: [place] Prior Approval Number:    Date Approved/Denied:   PASRR Number:    Discharge Plan: SNF    Current Diagnoses: Patient Active Problem List   Diagnosis Date Noted  . Anemia 02/20/2015  . Hyperglycemia 02/20/2015  . Generalized weakness 02/20/2015  . Left-sided Bell's palsy 02/20/2015  . Chronic atrial fibrillation ([place]) 02/20/2015  . Anticoagulated on Coumadin 02/20/2015  . Essential hypertension 02/20/2015  . Chronic CHF ([place]) 02/20/2015  . Hyperlipidemia 02/20/2015  . Dementia with behavioral disturbance 02/17/2015  . Encephalopathy acute 02/17/2015  . Pyuria, sterile 02/17/2015  . Pressure ulcer 02/17/2015  . Hypomagnesemia 02/06/2015  . Hyponatremia 01/28/2015  . HTN (hypertension), malignant 12/04/2014  . Status post cardiac pacemaker procedure 11/29/2014    Orientation ACTIVITIES/SOCIAL BLADDER RESPIRATION    Self, Place, Situation  Family supportive Incontinent O2 (As needed)  BEHAVIORAL SYMPTOMS/MOOD NEUROLOGICAL BOWEL NUTRITION STATUS      Continent Diet (heart healthy )  PHYSICIAN VISITS COMMUNICATION OF NEEDS Height & Weight Skin  30 days Verbally   160 lbs. PU Stage and Appropriate Care   PU Stage 2 Dressing:  (Foam dressing every 3 days)      AMBULATORY STATUS RESPIRATION    Assist extensive O2 (As needed)      Personal Care Assistance Level of Assistance  Bathing, Dressing Bathing Assistance: Maximum  assistance   Dressing Assistance: Maximum assistance      Functional Limitations Info  Hearing   Hearing Info: Impaired         SPECIAL CARE FACTORS FREQUENCY  PT (By licensed PT)     PT Frequency: 5             Additional Factors Info  Code Status Code Status Info: DNR             Current Medications (02/20/2015): Current Facility-Administered Medications  Medication Dose Route Frequency Provider Last Rate Last Dose  . antiseptic oral rinse (CPC / CETYLPYRIDINIUM CHLORIDE 0.05%) solution 7 mL  7 mL Mouth Rinse BID Theodoro Grist, MD   7 mL at 02/20/15 1002  . aspirin tablet 162.5 mg  162.5 mg Oral Daily Theodoro Grist, MD   162.5 mg at 02/20/15 1001  . benazepril (LOTENSIN) tablet 20 mg  20 mg Oral Daily Theodoro Grist, MD   20 mg at 02/20/15 1001  . haloperidol (HALDOL) tablet 2 mg  2 mg Oral BID Theodoro Grist, MD   2 mg at 02/20/15 1001  . haloperidol lactate (HALDOL) injection 1 mg  1 mg Intravenous Q6H PRN Theodoro Grist, MD   1 mg at 02/19/15 0210  . hydrALAZINE (APRESOLINE) injection 10 mg  10 mg Intravenous Q4H PRN Demetrios Loll, MD      . hydrALAZINE (APRESOLINE) tablet 25 mg  25 mg Oral 3 times per day Theodoro Grist, MD   Stopped at 02/20/15 0551  . imipenem-cilastatin (PRIMAXIN) 500 mg in sodium chloride 0.9 % 100 mL IVPB  500 mg Intravenous 3  times per day Loleta Dicker, RPH   500 mg at 02/20/15 0529  . ipratropium-albuterol (DUONEB) 0.5-2.5 (3) MG/3ML nebulizer solution 3 mL  3 mL Nebulization Q4H PRN Hillary Bow, MD   3 mL at 02/19/15 2127  . latanoprost (XALATAN) 0.005 % ophthalmic solution 1 drop  1 drop Right Eye QHS Theodoro Grist, MD   1 drop at 02/19/15 2235  . magnesium oxide (MAG-OX) tablet 400 mg  400 mg Oral Daily Theodoro Grist, MD   400 mg at 02/20/15 1001  . metoprolol succinate (TOPROL-XL) 24 hr tablet 25 mg  25 mg Oral Daily Theodoro Grist, MD   25 mg at 02/20/15 1001  . mineral oil enema 1 enema  1 enema Rectal Once Theodoro Grist, MD      .  nitroGLYCERIN (NITROSTAT) SL tablet 0.4 mg  0.4 mg Sublingual Q5 min PRN Theodoro Grist, MD      . oxybutynin (DITROPAN-XL) 24 hr tablet 5 mg  5 mg Oral QHS Theodoro Grist, MD   5 mg at 02/19/15 2232  . pantoprazole (PROTONIX) EC tablet 40 mg  40 mg Oral Daily Theodoro Grist, MD   40 mg at 02/20/15 1001  . sodium chloride 0.9 % injection 3 mL  3 mL Intravenous Q12H Theodoro Grist, MD   3 mL at 02/20/15 1003  . vitamin B-12 (CYANOCOBALAMIN) tablet 1,000 mcg  1,000 mcg Oral Daily Theodoro Grist, MD   1,000 mcg at 02/20/15 1001  . [START ON 02/22/2015] warfarin (COUMADIN) tablet 2 mg  2 mg Oral Q Wed Theodoro Grist, MD      . warfarin (COUMADIN) tablet 3 mg  3 mg Oral Once per day on Sun Mon Tue Thu Fri Sat Theodoro Grist, MD   3 mg at 02/19/15 1807  . Warfarin - Physician Dosing Inpatient   Does not apply [id number] Theodoro Grist, MD       Do not use this list as official medication orders. Please verify with discharge summary.  Discharge Medications:   Medication List    STOP taking these medications        aspirin 325 MG tablet  Replaced by:  aspirin EC 81 MG tablet     KLOR-CON 10 10 MEQ tablet  Generic drug:  potassium chloride      TAKE these medications        aspirin EC 81 MG tablet  Take 1 tablet (81 mg total) by mouth daily.     benazepril 20 MG tablet  Commonly known as:  LOTENSIN  Take 1 tablet (20 mg total) by mouth daily.     bimatoprost 0.03 % ophthalmic solution  Commonly known as:  LUMIGAN  Place 1 drop into the right eye at bedtime.     calcium carbonate 500 MG chewable tablet  Commonly known as:  TUMS - dosed in mg elemental calcium  Chew 1 tablet (200 mg of elemental calcium total) by mouth 2 (two) times daily with a meal.     conjugated estrogens vaginal cream  Commonly known as:  PREMARIN  Place 1 Applicatorful vaginally daily.     haloperidol 1 MG tablet  Commonly known as:  HALDOL  Take 1 tablet (1 mg total) by mouth 2 (two) times daily.     hydrALAZINE 25 MG  tablet  Commonly known as:  APRESOLINE  Take 1 tablet (25 mg total) by mouth every 8 (eight) hours.     Magnesium Oxide 400 (240 MG) MG Tabs  Take 400 mg  by mouth daily.     metoprolol succinate 25 MG 24 hr tablet  Commonly known as:  TOPROL-XL  Take 25 mg by mouth daily.     nitrofurantoin 100 MG capsule  Commonly known as:  MACRODANTIN  Take 100 mg by mouth daily.     nitroGLYCERIN 0.4 MG SL tablet  Commonly known as:  NITROSTAT  Place 0.4 mg under the tongue every 5 (five) minutes as needed for chest pain. After 3rd dose if no relief call 911     omeprazole 20 MG capsule  Commonly known as:  PRILOSEC  Take 20 mg by mouth daily.     oxybutynin 5 MG 24 hr tablet  Commonly known as:  DITROPAN-XL  Take 5 mg by mouth at bedtime.     vitamin B-12 1000 MCG tablet  Commonly known as:  CYANOCOBALAMIN  Take 1,000 mcg by mouth daily.     warfarin 3 MG tablet  Commonly known as:  COUMADIN  Take 3 mg by mouth See admin instructions. Take 1 tablet orally every day except on Wednesday     warfarin 2 MG tablet  Commonly known as:  COUMADIN  Take 2 mg by mouth every Wednesday.        Relevant Imaging Results:  Relevant Lab Results:  Recent Labs    Additional Information  (PICC line for antibiotics)  Alonna Buckler, LCSW

## 2015-02-20 NOTE — Care Management Important Message (Signed)
Important Message  Patient Details  Name: Joyce Vasquez MRN: [id number] Date of Birth: 1916/11/14   Medicare Important Message Given:  Yes-third notification given    Shelbie Ammons, RN 02/20/2015, 11:15 AM

## 2015-02-20 NOTE — Plan of Care (Signed)
Problem: Discharge/Transitional Outcomes Goal: Other Discharge Outcomes/Goals Outcome: Progressing Plan of care progress to goal: Patient has been without a sitter since 0700 today. Pt receiving IV ABX in PICC line. VSS. Two assist to chair today. Enema given 1x, no results thus far. Family at the bedside. Pt to be d/c'd to SNF when ready.

## 2015-02-20 NOTE — Plan of Care (Signed)
Problem: Discharge/Transitional Outcomes Goal: Barriers To Progression Addressed/Resolved Outcome: Progressing Pt less confused, disoriented to situation. Problem for admission associated more with hyponatremia. Sodium level of 125 yesterday. Next sodium level pending for this am.  Goal: Hemodynamically stable Outcome: Progressing Blood pressure improved. Pt on scheduled doses of  Hydralazine  Goal: Independent mobility/functioning independent or with min Independent mobility/functioning independently or with minimal assistance  Outcome: Not Progressing Pt did not work with physical therapy and did not get out of bed yesterday.  Goal: INR monitor plan established Outcome: Progressing Next PT/INR this am Goal: Family and patient agree upon discharge plan Outcome: Progressing Family here earlier on the shift. Pt from [facility]    Goal: Family/Caregiver willing and able to support plan Family/Caregiver willing and able to support plan for self-management after transition home  Outcome: Not Applicable Date Met:  51/70/01 Pt from [facility] Goal: Other Discharge Outcomes/Goals Outcome: Progressing Pt intermittently oriented to place and time. Pt did not really give an accurate response to why she is here.  Incontinent of urine. Safety sitter at bedside to prevent patient from pulling out her PICC line . B/p improving. No complaints of pain.

## 2015-02-21 LAB — PROTIME-INR
INR: 2.91
PROTHROMBIN TIME: 30.5 s — AB (ref 11.4–15.0)

## 2015-02-21 LAB — SODIUM: SODIUM: 129 mmol/L — AB (ref 135–145)

## 2015-02-21 MED ORDER — WARFARIN - PHARMACIST DOSING INPATIENT: Freq: Every day | Status: DC

## 2015-02-21 MED ORDER — WARFARIN SODIUM 2 MG PO TABS: 2.0000 mg | ORAL_TABLET | Freq: Every day | ORAL | Status: DC

## 2015-02-21 NOTE — Progress Notes (Signed)
CSW reviewed SNF options with family yesterday, they are visiting options and will confirm with CSW with facility they would like today. Pt likely to dc today if she has remained without a sitter for 24 hours. Family aware of potential dc today.   Toma Copier, [place]

## 2015-02-21 NOTE — Progress Notes (Signed)
Physical Therapy Treatment Patient Details Name: Joyce Vasquez MRN: [id number] DOB: 02/07/1917 Today's Date: 02/21/2015    History of Present Illness Joyce Vasquez is a 81 y.o. female with a known history of chronic A. fib on Coumadin, hypertension, hyperlipidemia, glaucoma who was recently discharged from [facility] to [facility]. Pt returend to [facility] due to confusion and hypnatremia. She was admitted for acute metabolic encephalopathy, hyponatremia, and UTI. At baseline pt lives at [facility] independent living. Prior to hospital admissions in October 2016 she was independent with ADLs and went to dining facility for meals. Daufther provides history as patient is HOH. Pt is AOx3 at time of evaluation. Of note pt has history of Bell's Palsy with chronic L facial droop.     PT Comments    Pt demonstrates weakness with all mobility. However she is able to decrease her need for assistance with instruction for anterior weight shifting and proper sequencing during sit to stand transfers. Pt able to complete all bed exercises as instructed. Pt is grossly weak and will need SNF placement in order to return to prior level of function. Pt will benefit from skilled PT services to address deficits in strength, balance, and mobility in order to return to full function at home.    Follow Up Recommendations  SNF (Family does not want pt to return to [facility])     Equipment Recommendations  None recommended by PT (pt already has 4ww)    Recommendations for Other Services       Precautions / Restrictions Precautions Precautions: Fall Restrictions Weight Bearing Restrictions: No    Mobility  Bed Mobility Overal bed mobility: Needs Assistance Bed Mobility: Supine to Sit;Sit to Supine     Supine to sit: Mod assist Sit to supine: Mod assist   General bed mobility comments: Pt with generalized weakness resulting in difficulty with bed mobility. Requires verbal cues and hand over hand  assitance for proper sequencinc  Transfers Overall transfer level: Needs assistance Equipment used: Rolling walker (2 wheeled) Transfers: Sit to/from Stand Sit to Stand: Mod assist (progressing to minA)         General transfer comment: Pt provided cues for proper sequencing with transfers. Practiced increased anterior weight shifting with patient and pt is able to decrease support from mod to minA. Pt requires cues to extend hip and knees once she is upright in standing. Pt with posterior leaning requiring cues to increase anterior weight shifting. Attempted standing without UE support but pt unwilling to leg go of walker.  Ambulation/Gait             General Gait Details: Unable to attempt at this time due to poor balance with posterior leaning. Performed standing marches at EOB.   Stairs            Wheelchair Mobility    Modified Rankin (Stroke Patients Only)       Balance Overall balance assessment: Needs assistance   Sitting balance-Leahy Scale: Fair       Standing balance-Leahy Scale: Poor                      Cognition Arousal/Alertness: Awake/alert Behavior During Therapy: WFL for tasks assessed/performed Overall Cognitive Status: Within Functional Limits for tasks assessed                      Exercises Total Joint Exercises Ankle Circles/Pumps: AROM;Both;Supine;15 reps Straight Leg Raises: AROM;Strengthening;Both;Supine;15 reps Knee Flexion: AROM;Both;15 reps;Seated  General Exercises - Lower Extremity Quad Sets: Strengthening;Both;15 reps;Supine Hip ABduction/ADduction: Strengthening;Both;15 reps;Supine (Performed x 15 in seated as well) Hip Flexion/Marching: Strengthening;Both;15 reps;Seated    General Comments        Pertinent Vitals/Pain Pain Assessment: No/denies pain    Home Living                      Prior Function            PT Goals (current goals can now be found in the care plan section) Acute  Rehab PT Goals Patient Stated Goal: To return to [facility] [place] PT Goal Formulation: With patient/family Time For Goal Achievement: 03/04/15 Potential to Achieve Goals: Good Progress towards PT goals: Progressing toward goals    Frequency  Min 2X/week    PT Plan Current plan remains appropriate    Co-evaluation             End of Session Equipment Utilized During Treatment: Gait belt Activity Tolerance: Patient tolerated treatment well Patient left: with family/visitor present;with call bell/phone within reach;in bed;with bed alarm set (CNA coming to change sheets and diaper)     Time: 6546-5035 PT Time Calculation (min) (ACUTE ONLY): 29 min  Charges:  $Therapeutic Exercise: 8-22 mins $Therapeutic Activity: 8-22 mins                    G Codes:      Lyndel Safe Kamrie Fanton PT, DPT   Ciji Boston 02/21/2015, 12:11 PM

## 2015-02-21 NOTE — Progress Notes (Signed)
ANTICOAGULATION CONSULT NOTE - Initial Consult  Pharmacy Consult for warfarin Indication: atrial fibrillation  Allergies  Allergen Reactions  . Coreg [Carvedilol]   . Erythromycin Base Nausea And Vomiting  . Penicillins Swelling    "swelling of throat" Has patient had a PCN reaction causing immediate rash, facial/tongue/throat swelling, SOB or lightheadedness with hypotension: Yes Has patient had a PCN reaction causing severe rash involving mucus membranes or skin necrosis: No Has patient had a PCN reaction that required hospitalization No Has patient had a PCN reaction occurring within the last 10 years: No If all of the above answers are "NO", then may proceed with Cephalosporin use.  . Vitamin D Analogs   . Cefuroxime Axetil Nausea Only and Rash  . Morphine And Related Rash    Patient Measurements: Height: 5\' 6"  (167.6 cm) Weight: 160 lb (72.576 kg) IBW/kg (Calculated) : 59.3   Vital Signs: Temp: 98.2 F (36.8 C) (11/01 1400) Temp Source: Oral (11/01 1400) BP: 151/45 mmHg (11/01 1400) Pulse Rate: 59 (11/01 1400)  Labs:  Recent Labs  02/19/15 0433 02/19/15 0812 02/20/15 0446 02/21/15 0513  HGB  --   --  7.9*  --   LABPROT  --  25.3* 33.0* 30.5*  INR  --  2.28 3.23 2.91  CREATININE 0.59  --  0.69  --     Estimated Creatinine Clearance: 42.9 mL/min (by C-G formula based on Cr of 0.69).   Medical History: Past Medical History  Diagnosis Date  . Hypertension   . Arthritis     osteoarthritis  . Hyperlipidemia   . Temporal giant cell arteritis (HCC)     left eye  . Atrial fibrillation ([place])   . GERD (gastroesophageal reflux disease)   . CHF (congestive heart failure) ([place])   . Glaucoma   . DJD (degenerative joint disease)   . TIA (transient ischemic attack)   . Bell's palsy   . Cancer ([place])     skin    Medications:  Scheduled:  . antiseptic oral rinse  7 mL Mouth Rinse BID  . aspirin  162.5 mg Oral Daily  . benazepril  20 mg Oral Daily  .  haloperidol  2 mg Oral BID  . imipenem-cilastatin  500 mg Intravenous 3 times per day  . latanoprost  1 drop Right Eye QHS  . magnesium oxide  400 mg Oral Daily  . metoprolol succinate  25 mg Oral Daily  . oxybutynin  5 mg Oral QHS  . pantoprazole  40 mg Oral Daily  . sodium chloride  3 mL Intravenous Q12H  . vitamin B-12  1,000 mcg Oral Daily  . warfarin  2 mg Oral q1800  . Warfarin - Pharmacist Dosing Inpatient   Does not apply q1800    Assessment: Patient is a 81 yo female with history of a fib on warfarin as outpatient.  Outpatient dosing of warfarin 3 mg daily except 2 mg on Wednesdays.   INR today of 2.91  Goal of Therapy:  INR 2-3 Monitor CBC per policy   Plan:  Patient's INR is therapeutic today at 2.91.  Will decreased warfarin to 2 mg po daily and recheck INR in AM.    Patient is on antibiotic therapy with Primaxin IV for history of ESBL E Coli UTI.  Antibiotics may potentiate warfarin's effects on the INR.   Pharmacy will continue to follow.  Joyce Vasquez G 02/21/2015,2:02 PM

## 2015-02-21 NOTE — Progress Notes (Signed)
Report called to [place], RN @ [facility]. PICC line removed per orders.  [facility]  Clarise Cruz, RN

## 2015-02-21 NOTE — Discharge Summary (Addendum)
[facility] NAME: Joyce Vasquez    MR#:  [id number]  DATE OF BIRTH:  1916-07-27  DATE OF ADMISSION:  02/17/2015 ADMITTING PHYSICIAN: Theodoro Grist, MD  DATE OF DISCHARGE: No discharge date for patient encounter.  PRIMARY CARE PHYSICIAN: Chrisandra Carota, MD     ADMISSION DIAGNOSIS:  Facial weakness [R29.810] Hyponatremia [E87.1] Tongue deviation [K14.8] Urinary tract infection without hematuria, site unspecified [N39.0] Altered mental status, unspecified altered mental status type [R41.82]  DISCHARGE DIAGNOSIS:  Principal Problem:   Encephalopathy acute Active Problems:   Dementia with behavioral disturbance   Pyuria, sterile   Anticoagulated on Coumadin   Pressure ulcer   Anemia   Hyperglycemia   Generalized weakness   Left-sided Bell's palsy   Chronic atrial fibrillation (HCC)   Essential hypertension   Chronic CHF ([place])   Hyperlipidemia   SECONDARY DIAGNOSIS:   Past Medical History  Diagnosis Date  . Hypertension   . Arthritis     osteoarthritis  . Hyperlipidemia   . Temporal giant cell arteritis (HCC)     left eye  . Atrial fibrillation ([place])   . GERD (gastroesophageal reflux disease)   . CHF (congestive heart failure) ([place])   . Glaucoma   . DJD (degenerative joint disease)   . TIA (transient ischemic attack)   . Bell's palsy   . Cancer ([place])     skin    .pro HOSPITAL COURSE:  The patient is a 81 year old female with history of A. fib on Coumadin therapy, hypertension, CHF, Bell's palsy, hyperlipidemia who came into the hospital with confusion, hyponatremia , worsening left sided facial weakness , she was noted to have pyuria and because of concern of recurrence of ESBL infection, she was initiated on imipenem, IV fluids. PICC line was placed and replaced after patient was through it due to confusion. She was managed on IV antibiotic. However, her urine cultures came back negative. With IV fluid  administration and improvement of her sodium level as well as management of her dementia with behavioral disturbances with Haldol her condition overall improved and she was felt to be ready to be discharged back to skilled nursing facility for rehabilitation. Unfortunately patient was not discharged to rehabilitation on 02/20/2015 due to sitter being accused by nursing staff in the morning of 31st of October 2016. Today, however, she is ready to be discharged. She feels comfortable and wants to eat. Her physical exam remains stable.  Discussion by problem 1. Acute metabolic encephalopathy due to hyponatremia , improved on IV fluids and Haldol. I doubt underlying acute stroke as patient had only left-sided facial weakness due to remote Bell's palsy, which improved overall. Stable clinically at present and is ready to be discharged to skilled nursing facility today, which is 24 hours after sitter was used. Continue Haldol 1 mg twice daily dose, weaning off as needed. Patient was managed with bedside sitter while in the hospital intermittently, she has been independent for the past 24 hours without a sitter and ready to be discharged to skilled nursing facility 2. Hyponatremia, likely dehydration related initially,  improved on IV fluids on arrival to the hospital. Patient's urine osmolarity was found to be 238, which is low and is not consistent with SIADH. Patient's sodium level is 129 today, which is improved from arrival level of 122.  Follow sodium level closely as outpatient. 3. Pyuria, urine cultures were negative for infection , continue suppressive therapy with nitrofurantoin at 100 mg once  daily dose, although patient's GFR is low and the nitrofurantoin may not work well in this age group.  4. Anemia, guaiac was ordered, however, not obtained , it is important to have Hemoccult checked to rule out a GI bleed as patient is on Coumadin therapy, although even with rehydration Patient's hemoglobin remained  stable.  5. Hyperglycemia. hemoglobin A1c 5.7 , effectively rule out diabetes 6. Generalized weakness , appreciate physical therapist input, discharge to skilled nursing facility today, discussed with care management 7. Dysphagia. Continue patient on dysphagia 1 diet with honey thick liquids, aspiration precautions 8. Essential hypertension, discontinue hydralazine. Patient's blood pressure readings of around 120, follow clinically and advanced medications if needed again DISCHARGE CONDITIONS:   Stable  CONSULTS OBTAINED:     DRUG ALLERGIES:   Allergies  Allergen Reactions  . Coreg [Carvedilol]   . Erythromycin Base Nausea And Vomiting  . Penicillins Swelling    "swelling of throat" Has patient had a PCN reaction causing immediate rash, facial/tongue/throat swelling, SOB or lightheadedness with hypotension: Yes Has patient had a PCN reaction causing severe rash involving mucus membranes or skin necrosis: No Has patient had a PCN reaction that required hospitalization No Has patient had a PCN reaction occurring within the last 10 years: No If all of the above answers are "NO", then may proceed with Cephalosporin use.  . Vitamin D Analogs   . Cefuroxime Axetil Nausea Only and Rash  . Morphine And Related Rash    DISCHARGE MEDICATIONS:   Current Discharge Medication List    START taking these medications   Details  aspirin EC 81 MG tablet Take 1 tablet (81 mg total) by mouth daily. Qty: 30 tablet, Refills: 0    haloperidol (HALDOL) 1 MG tablet Take 1 tablet (1 mg total) by mouth 2 (two) times daily. Qty: 60 tablet, Refills: 6      CONTINUE these medications which have NOT CHANGED   Details  benazepril (LOTENSIN) 20 MG tablet Take 1 tablet (20 mg total) by mouth daily. Qty: 30 tablet, Refills: 0    bimatoprost (LUMIGAN) 0.03 % ophthalmic solution Place 1 drop into the right eye at bedtime.    calcium carbonate (TUMS - DOSED IN MG ELEMENTAL CALCIUM) 500 MG chewable tablet  Chew 1 tablet (200 mg of elemental calcium total) by mouth 2 (two) times daily with a meal. Qty: 120 tablet, Refills: 0    conjugated estrogens (PREMARIN) vaginal cream Place 1 Applicatorful vaginally daily. Qty: 42.5 g, Refills: 2    Magnesium Oxide 400 (240 MG) MG TABS Take 400 mg by mouth daily. Qty: 30 tablet, Refills: 0    metoprolol succinate (TOPROL-XL) 25 MG 24 hr tablet Take 25 mg by mouth daily.     nitrofurantoin (MACRODANTIN) 100 MG capsule Take 100 mg by mouth daily.    nitroGLYCERIN (NITROSTAT) 0.4 MG SL tablet Place 0.4 mg under the tongue every 5 (five) minutes as needed for chest pain. After 3rd dose if no relief call 911    omeprazole (PRILOSEC) 20 MG capsule Take 20 mg by mouth daily.    oxybutynin (DITROPAN-XL) 5 MG 24 hr tablet Take 5 mg by mouth at bedtime.    vitamin B-12 (CYANOCOBALAMIN) 1000 MCG tablet Take 1,000 mcg by mouth daily.    !! warfarin (COUMADIN) 2 MG tablet Take 2 mg by mouth every Wednesday.     !! warfarin (COUMADIN) 3 MG tablet Take 3 mg by mouth See admin instructions. Take 1 tablet orally every day except  on Wednesday     !! - Potential duplicate medications found. Please discuss with provider.    STOP taking these medications     aspirin 325 MG tablet      hydrALAZINE (APRESOLINE) 25 MG tablet      potassium chloride (KLOR-CON 10) 10 MEQ tablet          DISCHARGE INSTRUCTIONS:    Patient is to follow-up with primary care physician, follow sodium levels closely  If you experience worsening of your admission symptoms, develop shortness of breath, life threatening emergency, suicidal or homicidal thoughts you must seek medical attention immediately by calling 911 or calling your MD immediately  if symptoms less severe.  You Must read complete instructions/literature along with all the possible adverse reactions/side effects for all the Medicines you take and that have been prescribed to you. Take any new Medicines after you have  completely understood and accept all the possible adverse reactions/side effects.   Please note  You were cared for by a hospitalist during your hospital stay. If you have any questions about your discharge medications or the care you received while you were in the hospital after you are discharged, you can call the unit and asked to speak with the hospitalist on call if the hospitalist that took care of you is not available. Once you are discharged, your primary care physician will handle any further medical issues. Please note that NO REFILLS for any discharge medications will be authorized once you are discharged, as it is imperative that you return to your primary care physician (or establish a relationship with a primary care physician if you do not have one) for your aftercare needs so that they can reassess your need for medications and monitor your lab values.    Today   CHIEF COMPLAINT:   Chief Complaint  Patient presents with  . Altered Mental Status    HISTORY OF PRESENT ILLNESS:  The patient is a 81 year old female with history of A. fib on Coumadin therapy, hypertension, CHF, Bell's palsy, hyperlipidemia who came into the hospital with confusion, hyponatremia , worsening left sided facial weakness , she was noted to have pyuria and because of concern of recurrence of ESBL infection, she was initiated on imipenem, IV fluids. PICC line was placed and replaced after patient was through it due to confusion. She was managed on IV antibiotic. However, her urine cultures came back negative. With IV fluid administration and improvement of her sodium level as well as management of her dementia with behavioral disturbances with Haldol her condition overall improved and she was felt to be ready to be discharged back to skilled nursing facility for rehabilitation. Unfortunately patient was not discharged to rehabilitation on 02/20/2015 due to sitter being accused by nursing staff in the morning of  31st of October 2016. Today, however, she is ready to be discharged. She feels comfortable and wants to eat. Her physical exam remains stable.  Discussion by problem 1. Acute metabolic encephalopathy due to hyponatremia , improved on IV fluids and Haldol. I doubt underlying acute stroke as patient had only left-sided facial weakness due to remote Bell's palsy, which improved overall. Stable clinically at present and is ready to be discharged to skilled nursing facility today, which is 24 hours after sitter was used. Continue Haldol 1 mg twice daily dose, weaning off as needed. Patient was managed with bedside sitter while in the hospital intermittently, she has been independent for the past 24 hours without a sitter and  ready to be discharged to skilled nursing facility 2. Hyponatremia, likely dehydration related initially,  improved on IV fluids on arrival to the hospital. Patient's urine osmolarity was found to be 238, which is low and is not consistent with SIADH. Patient's sodium level is 129 today, which is improved from arrival level of 122.  Follow sodium level closely as outpatient. 3. Pyuria, urine cultures were negative for infection , continue suppressive therapy with nitrofurantoin at 100 mg once daily dose, although patient's GFR is low and the nitrofurantoin may not work well in this age group.  4. Anemia, guaiac was ordered, however, not obtained , it is important to have Hemoccult checked to rule out a GI bleed as patient is on Coumadin therapy, although even with rehydration Patient's hemoglobin remained stable.  5. Hyperglycemia. hemoglobin A1c 5.7 , effectively rule out diabetes 6. Generalized weakness , appreciate physical therapist input, discharge to skilled nursing facility today, discussed with care management 7. Dysphagia. Continue patient on dysphagia 1 diet with honey thick liquids, aspiration precautions 8. Essential hypertension, discontinue hydralazine. Patient's blood  pressure readings of around 120, follow clinically and advanced medications if needed again VITAL SIGNS:  Blood pressure 126/46, pulse 69, temperature 99.3 F (37.4 C), temperature source Oral, resp. rate 18, height 5\' 6"  (1.676 m), weight 72.576 kg (160 lb), SpO2 97 %.  I/O:  No intake or output data in the 24 hours ending 02/21/15 1005  PHYSICAL EXAMINATION:  GENERAL:  81 y.o.-year-old patient lying in the bed with no acute distress. Left facial weakness including forehead EYES: Pupils equal, round, reactive to light and accommodation. No scleral icterus. Extraocular muscles intact.  HEENT: Head atraumatic, normocephalic. Oropharynx and nasopharynx clear.  NECK:  Supple, no jugular venous distention. No thyroid enlargement, no tenderness.  LUNGS: Normal breath sounds bilaterally, no wheezing, rales,rhonchi or crepitation. No use of accessory muscles of respiration.  CARDIOVASCULAR: S1, S2 normal. No murmurs, rubs, or gallops.  ABDOMEN: Soft, non-tender, non-distended. Bowel sounds present. No organomegaly or mass.  EXTREMITIES: No pedal edema, cyanosis, or clubbing.  NEUROLOGIC: Cranial nerves II through XII are intact. Muscle strength 5/5 in all extremities. Sensation intact. Gait not checked.  PSYCHIATRIC: The patient is alert and oriented x 3.  SKIN: No obvious rash, lesion, or ulcer.   DATA REVIEW:   CBC  Recent Labs Lab 02/18/15 0515 02/20/15 0446  WBC 6.8  --   HGB 9.5* 7.9*  HCT 28.7*  --   PLT 181  --     Chemistries   Recent Labs Lab 02/20/15 0446 02/21/15 0513  NA 127* 129*  K 3.8  --   CL 97*  --   CO2 25  --   GLUCOSE 88  --   BUN 16  --   CREATININE 0.69  --   CALCIUM 8.6*  --     Cardiac Enzymes  Recent Labs Lab 02/17/15 0847  TROPONINI <0.03    Microbiology Results  Results for orders placed or performed during the hospital encounter of 02/17/15  Urine culture     Status: None   Collection Time: 02/17/15  8:47 AM  Result Value Ref Range  Status   Specimen Description URINE, RANDOM  Final   Special Requests NONE  Final   Culture MULTIPLE SPECIES PRESENT, SUGGEST RECOLLECTION  Final   Report Status 02/19/2015 FINAL  Final  Culture, blood (routine x 2)     Status: None (Preliminary result)   Collection Time: 02/17/15 11:52 AM  Result Value  Ref Range Status   Specimen Description BLOOD RIGHT ASSIST CONTROL  Final   Special Requests BOTTLES DRAWN AEROBIC AND ANAEROBIC [place]  Final   Culture NO GROWTH 4 DAYS  Final   Report Status PENDING  Incomplete  Culture, blood (routine x 2)     Status: None (Preliminary result)   Collection Time: 02/17/15 11:57 AM  Result Value Ref Range Status   Specimen Description BLOOD RIGHT HAND  Final   Special Requests BOTTLES DRAWN AEROBIC AND ANAEROBIC 6CC  Final   Culture NO GROWTH 4 DAYS  Final   Report Status PENDING  Incomplete  Urine culture     Status: None (Preliminary result)   Collection Time: 02/18/15  3:45 PM  Result Value Ref Range Status   Specimen Description URINE, CATHETERIZED  Final   Special Requests Normal  Final   Culture 20,000 COLONIES/mL YEAST  Final   Report Status PENDING  Incomplete    RADIOLOGY:  Dg Chest 1 View  02/19/2015  CLINICAL DATA:  PICC line placement, previous line pulled out. EXAM: CHEST 1 VIEW COMPARISON:  Chest x-rays dated 02/17/2015 and 02/01/2015. FINDINGS: Right-sided PICC line in place with tip well-positioned in the expected region of the cavoatrial junction. Cardiomegaly appears unchanged. Overall cardiomediastinal silhouette is stable in size and configuration. Left chest wall pacemaker/ AICD is stable in position. Opacities at each lung base are likely a combination of atelectasis and small effusions. The mild central pulmonary vascular congestion is not significantly changed. IMPRESSION: Right-sided PICC line placed with tip well-positioned in the expected region of the cavoatrial junction. No procedural complicating features seen. Otherwise  stable chest x-ray. Mild central pulmonary vascular congestion is not significantly changed. Bibasilar airspace opacities are unchanged and likely represent a combination of atelectasis and small effusions. Electronically Signed   By: Franki Cabot M.D.   On: 02/19/2015 12:00    EKG:   Orders placed or performed during the hospital encounter of 02/17/15  . ED EKG  . ED EKG  . EKG 12-Lead  . EKG 12-Lead      Management plans discussed with the patient, family and they are in agreement.  CODE STATUS:     Code Status Orders        Start     Ordered   02/17/15 1340  Do not attempt resuscitation (DNR)   Continuous    Question Answer Comment  In the event of cardiac or respiratory ARREST Do not call a "code blue"   In the event of cardiac or respiratory ARREST Do not perform Intubation, CPR, defibrillation or ACLS   In the event of cardiac or respiratory ARREST Use medication by any route, position, wound care, and other measures to relive pain and suffering. May use oxygen, suction and manual treatment of airway obstruction as needed for comfort.      02/17/15 1339    Advance Directive Documentation        Most Recent Value   Type of Advance Directive  Out of facility DNR (pink MOST or yellow form)   Pre-existing out of facility DNR order (yellow form or pink MOST form)  Physician notified to receive inpatient order   "MOST" Form in Place?        TOTAL TIME TAKING CARE OF THIS PATIENT: 40 minutes.    Theodoro Grist M.D on 02/21/2015 at 10:05 AM  Between 7am to 6pm - Pager - [phone]  After 6pm go to [email or web address] - password [email or web address]  Office  [phone]  CC: Primary care physician; Chrisandra Carota, MD

## 2015-02-22 LAB — CULTURE, BLOOD (ROUTINE X 2)
CULTURE: NO GROWTH
Culture: NO GROWTH

## 2015-02-22 LAB — URINE CULTURE
Culture: 20000
Special Requests: NORMAL

## 2015-02-28 ENCOUNTER — Encounter: Payer: Self-pay | Admitting: Infectious Diseases

## 2017-03-08 IMAGING — CR DG CHEST 1V PORT
1 series · 1 of 1 positions shown · non-contrast
Comparison: 02/01/2015

CLINICAL DATA: PICC line placement.  Congestive heart failure.

EXAM:
PORTABLE CHEST 1 VIEW

[ap]
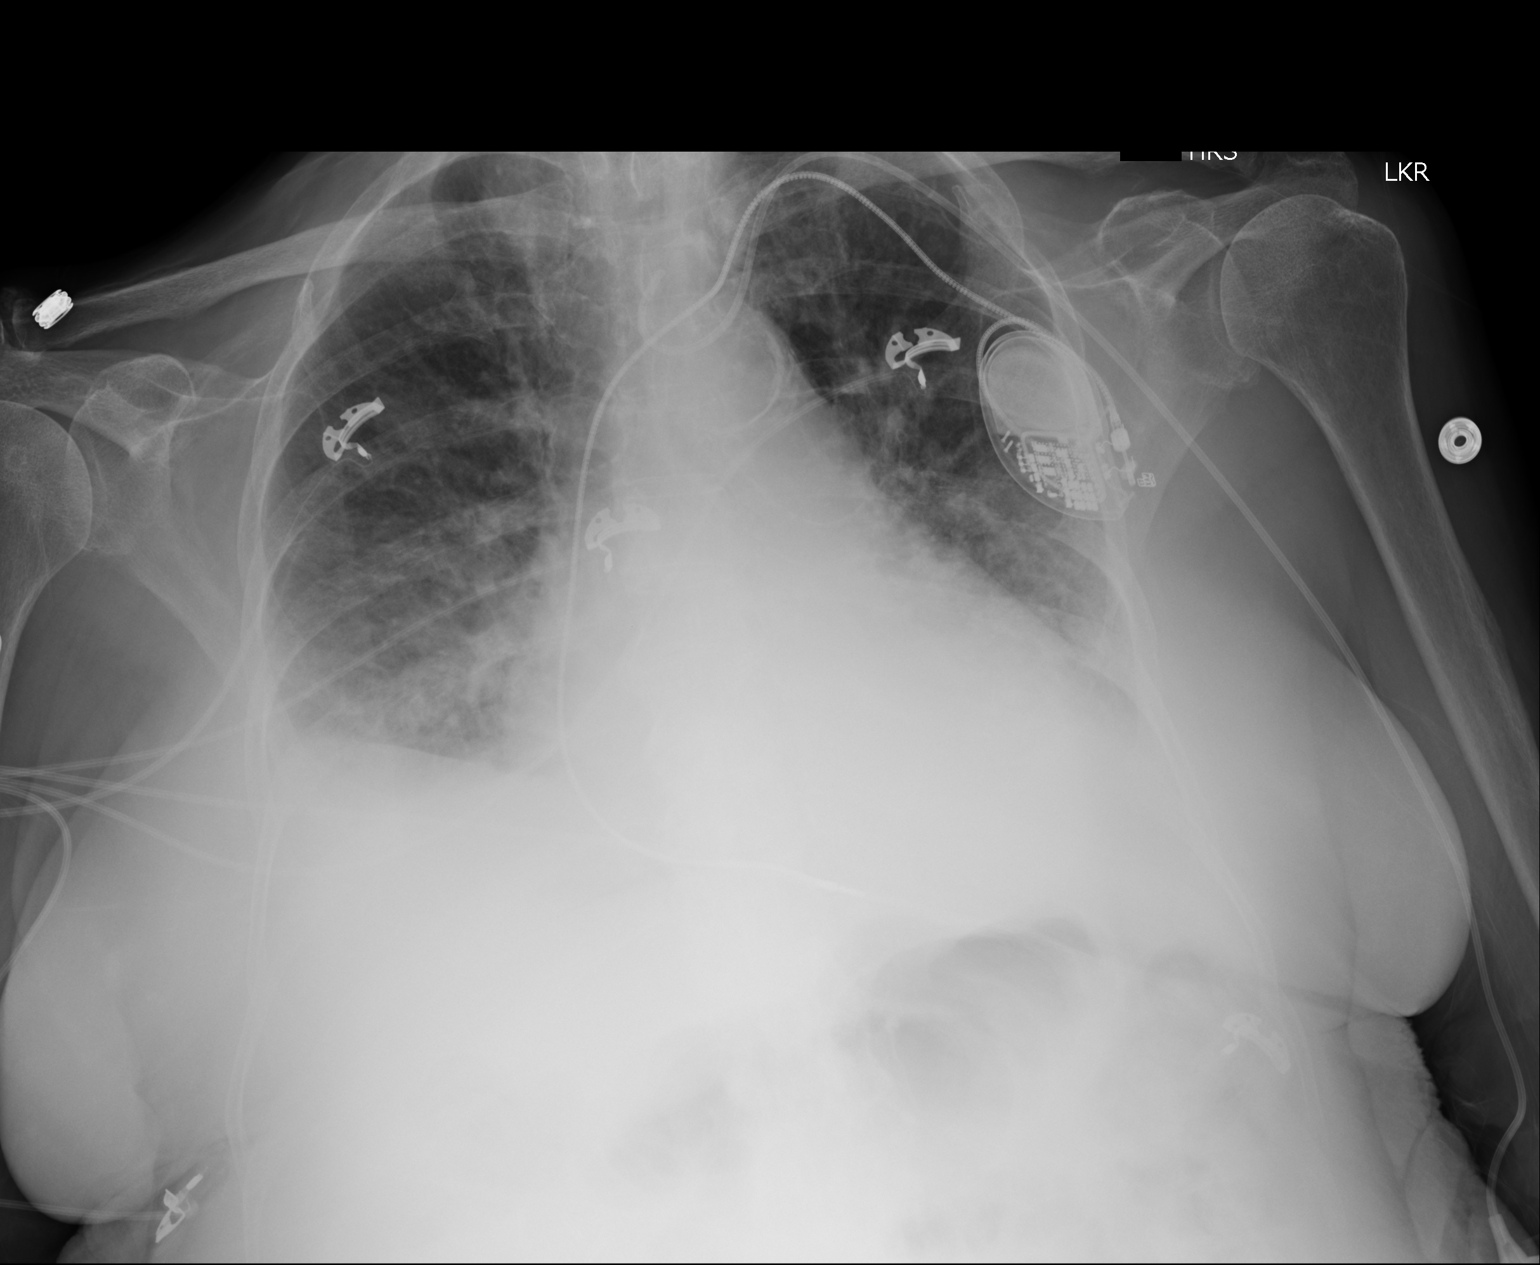

[1 of 1 positions shown; findings below may reference images not displayed]

FINDINGS: A new left arm PICC line is seen with the tip curled back on itself
in the left brachiocephalic vein.

Single lead transvenous pacemaker remains in appropriate position.
Cardiomegaly is stable as well as diffuse interstitial edema
pattern. Small bilateral pleural effusions and left basilar
atelectasis or consolidation are also unchanged.
IMPRESSION: Abnormal left arm PICC line position, with tip curled back on itself
in the left brachiocephalic vein.

Stable congestive heart failure, with bilateral pleural effusions
and left basilar atelectasis or consolidation.

## 2017-03-08 IMAGING — CR DG CHEST 1V PORT
1 series · 1 of 1 positions shown · non-contrast
Comparison: 02/01/2015

CLINICAL DATA: PICC line placement

EXAM:
PORTABLE CHEST 1 VIEW

[ap]
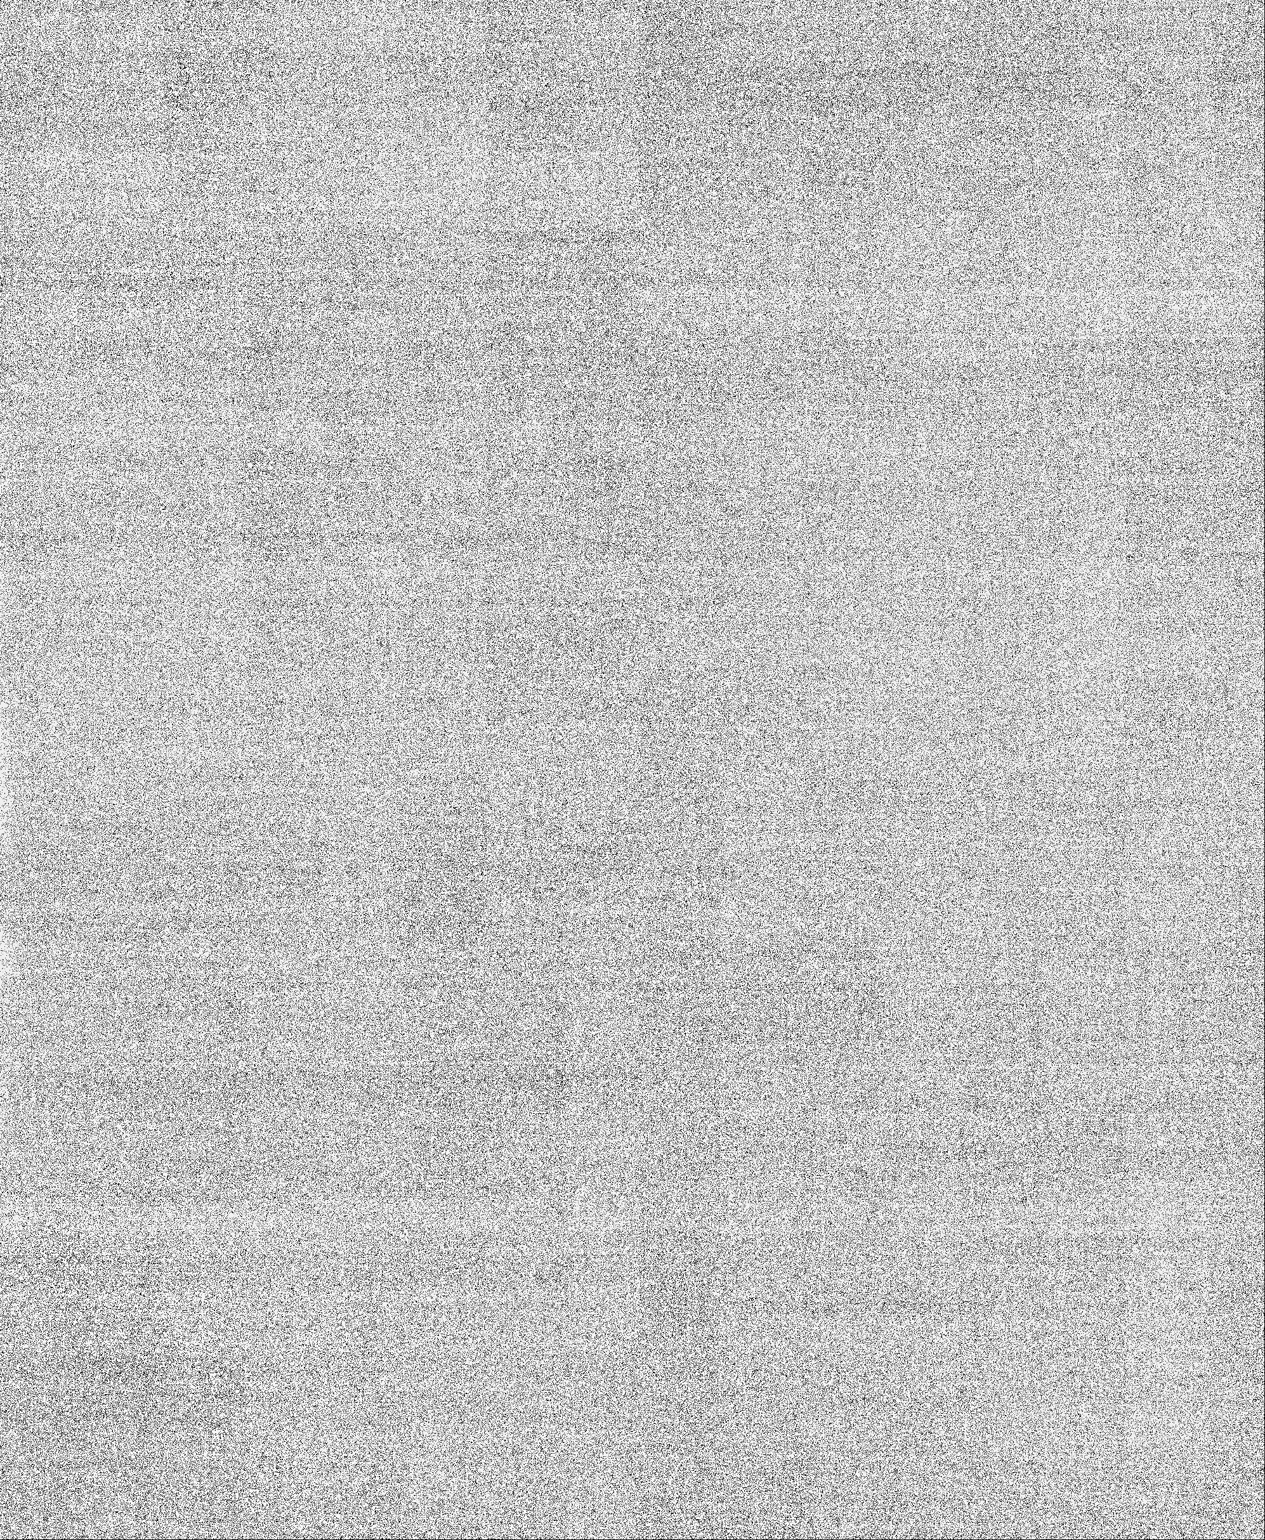

[1 of 1 positions shown; findings below may reference images not displayed]

FINDINGS: Stable cardiac enlargement and tortuous, ectatic and calcified
thoracic aorta. Persistent bilateral pleural effusions and bibasilar
atelectasis.

The left PICC line is folded back on itself in the left
brachiocephalic vein. There could be a stenosis in the
brachiocephalic vein due to the pacemaker.
IMPRESSION: The left PICC line is folded back on itself in the mid
brachiocephalic vein possibly due to a stenosis from the pacer wire.
The tip of the catheter is probably near the left jugular vein
confluence.

## 2018-12-22 DEATH — deceased
